# Patient Record
Sex: Female | Born: 1945 | Race: White | Hispanic: No | Marital: Married | Smoking: Never smoker
Health system: Southern US, Community
[De-identification: ages and names within clinical notes are randomized; demographics above are authoritative.]

## PROBLEM LIST (undated history)

## (undated) DIAGNOSIS — D500 Iron deficiency anemia secondary to blood loss (chronic): Secondary | ICD-10-CM

## (undated) DIAGNOSIS — K219 Gastro-esophageal reflux disease without esophagitis: Secondary | ICD-10-CM

## (undated) DIAGNOSIS — U071 COVID-19: Secondary | ICD-10-CM

## (undated) DIAGNOSIS — D509 Iron deficiency anemia, unspecified: Secondary | ICD-10-CM

## (undated) DIAGNOSIS — I10 Essential (primary) hypertension: Secondary | ICD-10-CM

## (undated) DIAGNOSIS — K317 Polyp of stomach and duodenum: Secondary | ICD-10-CM

## (undated) DIAGNOSIS — Z7901 Long term (current) use of anticoagulants: Secondary | ICD-10-CM

## (undated) DIAGNOSIS — N183 Chronic kidney disease, stage 3 unspecified: Secondary | ICD-10-CM

## (undated) DIAGNOSIS — D649 Anemia, unspecified: Secondary | ICD-10-CM

## (undated) DIAGNOSIS — I4891 Unspecified atrial fibrillation: Secondary | ICD-10-CM

## (undated) DIAGNOSIS — Z9841 Cataract extraction status, right eye: Secondary | ICD-10-CM

## (undated) DIAGNOSIS — E785 Hyperlipidemia, unspecified: Secondary | ICD-10-CM

## (undated) DIAGNOSIS — I509 Heart failure, unspecified: Secondary | ICD-10-CM

## (undated) DIAGNOSIS — I700 Atherosclerosis of aorta: Secondary | ICD-10-CM

## (undated) DIAGNOSIS — Z79899 Other long term (current) drug therapy: Secondary | ICD-10-CM

## (undated) DIAGNOSIS — H905 Unspecified sensorineural hearing loss: Secondary | ICD-10-CM

## (undated) DIAGNOSIS — M1990 Unspecified osteoarthritis, unspecified site: Secondary | ICD-10-CM

## (undated) DIAGNOSIS — R011 Cardiac murmur, unspecified: Secondary | ICD-10-CM

## (undated) DIAGNOSIS — I2510 Atherosclerotic heart disease of native coronary artery without angina pectoris: Secondary | ICD-10-CM

## (undated) DIAGNOSIS — Z9842 Cataract extraction status, left eye: Secondary | ICD-10-CM

## (undated) HISTORY — DX: Essential (primary) hypertension: I10

## (undated) HISTORY — PX: CATARACT EXTRACTION, BILATERAL: SHX1313

## (undated) HISTORY — DX: Iron deficiency anemia secondary to blood loss (chronic): D50.0

## (undated) HISTORY — PX: TUBAL LIGATION: SHX77

## (undated) HISTORY — DX: Hyperlipidemia, unspecified: E78.5

## (undated) HISTORY — DX: COVID-19: U07.1

## (undated) HISTORY — DX: Unspecified atrial fibrillation: I48.91

## (undated) HISTORY — DX: Anemia, unspecified: D64.9

---

## 2003-03-31 ENCOUNTER — Other Ambulatory Visit: Admission: RE | Admit: 2003-03-31 | Discharge: 2003-03-31 | Payer: Self-pay | Admitting: Family Medicine

## 2003-05-27 ENCOUNTER — Encounter: Payer: Self-pay | Admitting: Cardiology

## 2003-05-27 ENCOUNTER — Ambulatory Visit (HOSPITAL_COMMUNITY): Admission: RE | Admit: 2003-05-27 | Discharge: 2003-05-27 | Payer: Self-pay | Admitting: Family Medicine

## 2005-11-08 ENCOUNTER — Other Ambulatory Visit: Admission: RE | Admit: 2005-11-08 | Discharge: 2005-11-08 | Payer: Self-pay | Admitting: Family Medicine

## 2005-11-17 ENCOUNTER — Encounter: Admission: RE | Admit: 2005-11-17 | Discharge: 2005-11-17 | Payer: Self-pay | Admitting: Family Medicine

## 2006-11-27 ENCOUNTER — Other Ambulatory Visit: Admission: RE | Admit: 2006-11-27 | Discharge: 2006-11-27 | Payer: Self-pay | Admitting: Family Medicine

## 2009-09-03 ENCOUNTER — Other Ambulatory Visit: Admission: RE | Admit: 2009-09-03 | Discharge: 2009-09-03 | Payer: Self-pay | Admitting: Family Medicine

## 2009-12-09 ENCOUNTER — Encounter: Admission: RE | Admit: 2009-12-09 | Discharge: 2009-12-09 | Payer: Self-pay | Admitting: Family Medicine

## 2010-12-21 ENCOUNTER — Other Ambulatory Visit: Payer: Self-pay | Admitting: Family Medicine

## 2010-12-21 DIAGNOSIS — Z1231 Encounter for screening mammogram for malignant neoplasm of breast: Secondary | ICD-10-CM

## 2010-12-30 ENCOUNTER — Ambulatory Visit
Admission: RE | Admit: 2010-12-30 | Discharge: 2010-12-30 | Disposition: A | Discharge status: Home / Self Care | Payer: Medicare Other | Source: Ambulatory Visit | Attending: Family Medicine | Admitting: Family Medicine

## 2010-12-30 DIAGNOSIS — Z1231 Encounter for screening mammogram for malignant neoplasm of breast: Secondary | ICD-10-CM

## 2012-02-01 ENCOUNTER — Other Ambulatory Visit: Payer: Self-pay | Admitting: Family Medicine

## 2012-02-01 DIAGNOSIS — Z1231 Encounter for screening mammogram for malignant neoplasm of breast: Secondary | ICD-10-CM

## 2012-02-14 ENCOUNTER — Ambulatory Visit
Admission: RE | Admit: 2012-02-14 | Discharge: 2012-02-14 | Disposition: A | Discharge status: Home / Self Care | Payer: Medicare Other | Source: Ambulatory Visit | Attending: Family Medicine | Admitting: Family Medicine

## 2012-02-14 DIAGNOSIS — Z1231 Encounter for screening mammogram for malignant neoplasm of breast: Secondary | ICD-10-CM

## 2013-06-02 ENCOUNTER — Other Ambulatory Visit: Payer: Self-pay

## 2013-06-02 DIAGNOSIS — Z1231 Encounter for screening mammogram for malignant neoplasm of breast: Secondary | ICD-10-CM

## 2013-06-13 ENCOUNTER — Ambulatory Visit: Payer: Medicare Other

## 2013-07-02 ENCOUNTER — Ambulatory Visit
Admission: RE | Admit: 2013-07-02 | Discharge: 2013-07-02 | Disposition: A | Discharge status: Home / Self Care | Payer: Medicare Other | Source: Ambulatory Visit

## 2013-07-02 DIAGNOSIS — Z1231 Encounter for screening mammogram for malignant neoplasm of breast: Secondary | ICD-10-CM

## 2013-08-28 HISTORY — PX: REPLACEMENT TOTAL KNEE: SUR1224

## 2014-07-22 ENCOUNTER — Ambulatory Visit: Payer: Self-pay | Admitting: General Practice

## 2014-07-22 LAB — BASIC METABOLIC PANEL
Anion Gap: 4 — ABNORMAL LOW (ref 7–16)
BUN: 24 mg/dL — AB (ref 7–18)
CALCIUM: 8.9 mg/dL (ref 8.5–10.1)
CHLORIDE: 104 mmol/L (ref 98–107)
CO2: 31 mmol/L (ref 21–32)
Creatinine: 1.1 mg/dL (ref 0.60–1.30)
GFR CALC NON AF AMER: 52 — AB
Glucose: 112 mg/dL — ABNORMAL HIGH (ref 65–99)
Osmolality: 282 (ref 275–301)
Potassium: 3.6 mmol/L (ref 3.5–5.1)
Sodium: 139 mmol/L (ref 136–145)

## 2014-07-22 LAB — CBC
HCT: 37.1 % (ref 35.0–47.0)
HGB: 12.1 g/dL (ref 12.0–16.0)
MCH: 27 pg (ref 26.0–34.0)
MCHC: 32.6 g/dL (ref 32.0–36.0)
MCV: 83 fL (ref 80–100)
Platelet: 342 10*3/uL (ref 150–440)
RBC: 4.47 10*6/uL (ref 3.80–5.20)
RDW: 14.8 % — ABNORMAL HIGH (ref 11.5–14.5)
WBC: 10.5 10*3/uL (ref 3.6–11.0)

## 2014-07-22 LAB — URINALYSIS, COMPLETE
BLOOD: NEGATIVE
Bilirubin,UR: NEGATIVE
Glucose,UR: NEGATIVE mg/dL (ref 0–75)
Ketone: NEGATIVE
NITRITE: NEGATIVE
Ph: 5 (ref 4.5–8.0)
Protein: NEGATIVE
RBC, UR: NONE SEEN /HPF (ref 0–5)
Specific Gravity: 1.021 (ref 1.003–1.030)
WBC UR: 3 /HPF (ref 0–5)

## 2014-07-22 LAB — APTT: Activated PTT: 27.8 secs (ref 23.6–35.9)

## 2014-07-22 LAB — PROTIME-INR
INR: 1
PROTHROMBIN TIME: 12.9 s (ref 11.5–14.7)

## 2014-07-22 LAB — MRSA PCR SCREENING

## 2014-07-22 LAB — SEDIMENTATION RATE: Erythrocyte Sed Rate: 46 mm/hr — ABNORMAL HIGH (ref 0–30)

## 2014-07-24 LAB — URINE CULTURE

## 2014-07-31 ENCOUNTER — Ambulatory Visit: Payer: Self-pay | Admitting: General Practice

## 2014-08-02 LAB — URINE CULTURE

## 2014-08-05 ENCOUNTER — Inpatient Hospital Stay: Payer: Self-pay | Admitting: General Practice

## 2014-08-06 LAB — URINALYSIS, COMPLETE
Bilirubin,UR: NEGATIVE
GLUCOSE, UR: NEGATIVE mg/dL (ref 0–75)
KETONE: NEGATIVE
NITRITE: NEGATIVE
PH: 5 (ref 4.5–8.0)
SPECIFIC GRAVITY: 1.035 (ref 1.003–1.030)
Squamous Epithelial: <1

## 2014-08-06 LAB — BASIC METABOLIC PANEL
Anion Gap: 7 (ref 7–16)
BUN: 17 mg/dL (ref 7–18)
CALCIUM: 8.4 mg/dL — AB (ref 8.5–10.1)
CHLORIDE: 104 mmol/L (ref 98–107)
CREATININE: 1.01 mg/dL (ref 0.60–1.30)
Co2: 28 mmol/L (ref 21–32)
EGFR (African American): >60
EGFR (Non-African Amer.): 58 — ABNORMAL LOW
Glucose: 130 mg/dL — ABNORMAL HIGH (ref 65–99)
Osmolality: 281 (ref 275–301)
POTASSIUM: 3.5 mmol/L (ref 3.5–5.1)
Sodium: 139 mmol/L (ref 136–145)

## 2014-08-06 LAB — HEMOGLOBIN: HGB: 10.7 g/dL — ABNORMAL LOW (ref 12.0–16.0)

## 2014-08-06 LAB — PLATELET COUNT: PLATELETS: 274 10*3/uL (ref 150–440)

## 2014-08-07 LAB — HEMOGLOBIN: HGB: 10.4 g/dL — AB (ref 12.0–16.0)

## 2014-08-07 LAB — PLATELET COUNT: Platelet: 274 10*3/uL (ref 150–440)

## 2014-08-07 LAB — BASIC METABOLIC PANEL
ANION GAP: 7 (ref 7–16)
BUN: 16 mg/dL (ref 7–18)
CALCIUM: 8.3 mg/dL — AB (ref 8.5–10.1)
CHLORIDE: 101 mmol/L (ref 98–107)
CO2: 29 mmol/L (ref 21–32)
Creatinine: 1.17 mg/dL (ref 0.60–1.30)
GFR CALC AF AMER: 59 — AB
GFR CALC NON AF AMER: 49 — AB
Glucose: 136 mg/dL — ABNORMAL HIGH (ref 65–99)
Osmolality: 277 (ref 275–301)
Potassium: 3.4 mmol/L — ABNORMAL LOW (ref 3.5–5.1)
SODIUM: 137 mmol/L (ref 136–145)

## 2014-08-08 LAB — URINE CULTURE

## 2014-10-30 DIAGNOSIS — Z96652 Presence of left artificial knee joint: Secondary | ICD-10-CM | POA: Insufficient documentation

## 2014-11-27 ENCOUNTER — Other Ambulatory Visit: Payer: Self-pay

## 2014-11-27 DIAGNOSIS — Z1231 Encounter for screening mammogram for malignant neoplasm of breast: Secondary | ICD-10-CM

## 2014-12-08 ENCOUNTER — Ambulatory Visit
Admission: RE | Admit: 2014-12-08 | Discharge: 2014-12-08 | Disposition: A | Discharge status: Home / Self Care | Payer: Medicare HMO | Source: Ambulatory Visit

## 2014-12-08 DIAGNOSIS — Z1231 Encounter for screening mammogram for malignant neoplasm of breast: Secondary | ICD-10-CM

## 2014-12-19 NOTE — Op Note (Signed)
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  PROCEDURE PERFORMED: Left total knee arthroplasty using computer-assisted navigation.   SURGEON: Skip Estimable, M.D.   ASSISTANT: Rachelle Hora, PA-C (required to maintain retraction throughout the procedure).   ANESTHESIA: Spinal.   ESTIMATED BLOOD LOSS: 100 mL.   FLUIDS REPLACED: 1200 mL of crystalloid.   TOURNIQUET TIME: 111 minutes.   DRAINS: Two medium drains to reinfusion system.   SOFT TISSUE RELEASES: Anterior cruciate ligament, posterior cruciate ligament, deep medial collateral ligament and patellofemoral ligament.   IMPLANTS UTILIZED: DePuy PFC Sigma size 3 posterior stabilized femoral component (cemented), size 3 MBT tibial component (cemented), 35 mm three-peg oval dome patella (cemented), and a 10 mm stabilized rotating platform polyethylene insert.   INDICATIONS FOR SURGERY: The patient is a 69 year old female who has been seen for complaints of chronic progressive left knee pain. X-rays demonstrated severe degenerative changes in tricompartmental fashion with varus deformity. After discussion of the risks and benefits of surgical intervention, the patient expressed understanding of the risks and benefits and agreed with plans for surgical intervention.   PROCEDURE IN DETAIL: The patient was brought to the operating room and, after adequate spinal anesthesia was achieved, a tourniquet was placed on the patient's upper left thigh. The patient's left knee and leg were cleaned and prepped with alcohol and DuraPrep and draped in the usual sterile fashion. A "timeout" was performed as per usual protocol. The left lower extremity was exsanguinated using an Esmarch, and the tourniquet was inflated to 300 mmHg. An anterior  longitudinal incision was made followed by a standard mid vastus approach. A moderate effusion was evacuated. The deep fibers of the medial collateral ligament were elevated in a subperiosteal fashion off the medial flare of the tibia so as to maintain a continuous soft tissue sleeve. The patella was subluxed laterally and the patellofemoral ligament was incised. Prominent superior and inferior osteophytes emanating from the patella were debrided using rongeurs so as to better mobilize the patella. Inspection of the knee demonstrated severe degenerative changes in tricompartmental fashion with full-thickness loss of articular cartilage to the medial compartment. Prominent osteophytes were debrided from the medial and lateral aspect of both the tibia and femur. Anterior and posterior cruciate ligaments were excised. Two 4.0 mm Schanz pins were inserted into the femur and into the tibia for attachment of the ray of trackers used for computer-assisted navigation. Hip center was identified using the circumduction technique. Distal landmarks were mapped using the computer. The distal femur and proximal tibia were mapped using the computer. Distal femoral cutting guide was positioned using computer-assisted navigation so as to achieve a 5 degree distal valgus cut. Cut was performed and verified using the computer. The sizing device for the femur could not be adequately placed flush on the distal femoral cut due to limited range of motion. It was thus elected to proceed with resection of the proximal tibial aspect. The extramedullary tibial cutting guide was positioned using computer-assisted navigation so as to achieve 0 degree varus valgus alignment and 0 degree posterior slope. Cut was performed and verified using the computer. The medial and lateral menisci were excised. This allowed for adequate room to place the femoral sizing device. It was felt that a size 3 femur was appropriate. A size 3 cutting guide was  positioned and the anterior cut was performed and verified using the computer. This was followed by completion of the posterior and chamfer cuts. Femoral cutting guide for the central box was then positioned, and the central box cut was performed. The proximal tibia had been sized and it was felt that a size 3 tibial tray was appropriate. Tibial and femoral trials were inserted followed by insertion of a 10 mm polyethylene insert. This allowed for excellent mediolateral soft tissue balancing both in full extension and in flexion. Finally, the patella was cut and prepared so as to accommodate a 35 mm three-peg oval dome patella. Patellar trial was placed and the knee was placed through a range of motion with excellent patellar tracking appreciated. The femoral trial was removed. Central post hole for the tibial component was reamed followed by insertion of a keel punch. Tibial trials were then removed. The cut surfaces of bone were irrigated with copious amounts of normal saline with antibiotic solution using pulsatile lavage and then suctioned dry. Polymethyl methacrylate cement with gentamicin was prepared in the usual fashion using a vacuum mixer. Cement was applied to the cut surface of the proximal tibia as well as along the undersurface of a size 3 MBT tibial component. The tibial component was positioned and impacted into place. Excess cement was removed using freer elevators. Cement was then applied to the cut surface of the distal femur as well as along the posterior flanges of a size 3 posterior stabilized femoral component. Femoral component was positioned and impacted into place. Excess cement was removed using freer elevators. A 10 mm polyethylene trial was inserted and the knee was brought into full extension with steady axial compression applied. Finally, cement was applied to the backside of a 35 mm three-peg oval dome patella and the patellar component was positioned and patellar clamp applied.  Excess cement was removed using freer elevators.   After adequate curing of cement, the tourniquet was deflated after total tourniquet time of 111 minutes. Hemostasis was achieved using electrocautery. The knee was irrigated with copious amounts of normal saline with antibiotic solution using pulsatile lavage and then suctioned dry. The knee was inspected for any residual cement debris. Then 20 mL of 1.3% Exparel and 40 mL of normal saline was injected along the posterior capsule, medial and lateral gutters, and along the arthrotomy site. A 10 mm stabilized rotating platform polyethylene insert was inserted and the knee was placed through a range of motion. Excellent patellar tracking was appreciated and excellent mediolateral soft tissue balancing was noted in full extension and in flexion. Two medium drains were placed in the wound bed and brought out through a separate stab incision to be attached to a reinfusion system. The medial parapatellar portion of the incision was reapproximated using interrupted sutures of #1 Vicryl. The subcutaneous tissue was injected with 30 mL of 0.25% Marcaine with epinephrine. The subcutaneous tissue was then reapproximated in layers using first #0 Vicryl followed by #2-0 Vicryl. Skin was closed with skin staples. A sterile dressing was applied.   The patient tolerated the procedure well. She was transported to the recovery room in stable condition.  ____________________________ Laurice Record. Holley Bouche., MD jph:sb D: 08/06/2014 06:43:36 ET T: 08/06/2014 13:29:12 ET JOB#: [id number]  cc: Laurice Record. Holley Bouche., MD, <Dictator> Laurice Record Holley Bouche MD ELECTRONICALLY SIGNED 08/09/2014 21:54

## 2014-12-19 NOTE — Discharge Summary (Signed)
PATIENT NAME:  Amanda Frazier, Amanda Frazier MR#:  [id number] DATE OF BIRTH:  [birth year]  DATE OF ADMISSION:  08/05/2014 DATE OF DISCHARGE:  08/08/2014   ADMITTING DIAGNOSIS: Left knee degenerative arthritis.   DISCHARGE DIAGNOSIS: Left knee degenerative arthritis.   PROCEDURE PERFORMED: Left total knee arthroplasty using computer-assisted navigation.   SURGEON: Skip Estimable, M.D.   ASSISTANT: Rachelle Hora, PA-C.   ANESTHESIA: Spinal.   ESTIMATED BLOOD LOSS: 100 mL.  FLUIDS REPLACED: 1200 mL of crystalloid.   TOURNIQUET TIME: 111 minutes.   DRAINS: Two medium drains to reinfusion system.   SOFT TISSUE RELEASES: Anterior cruciate ligament, posterior cruciate ligament, deep medial collateral ligament and patellofemoral ligament.   IMPLANTS UTILIZED: DePuy PFC SIGMA size 3 posterior stabilized femoral component, size 3 MBT tibial component, 35 mm 3 peg oval dome patella, and a 10 mm stabilized rotating platform polyethylene insert.   HISTORY: The patient is a 69 year old female who has had severe complaints of chronic progressive left knee pain. X-rays demonstrated severe degenerative changes in tricompartmental fashion of a varus deformity. After discussion of the risks and benefits of surgical intervention, the patient expressed understanding of the risks and benefits and agreed with plans for surgical intervention.   PHYSICAL EXAMINATION: GENERAL: Well-developed, well-nourished, in no acute distress.  Varus thrust of the left knee.  HEENT: Atraumatic, normocephalic. Pupils equal, round, and reactive to light.  NECK: Supple, nontender with good range of motion.  LUNGS: Clear to auscultation. CARDIOVASCULAR: Regular rate and rhythm.  ABDOMEN: Soft, nontender, nondistended. Bowel sounds present.  LEFT LOWER EXTREMITY: Examination of left lower extremity shows the patient has mild swelling. No effusion, no warmth, erythema. She is tender along the medial joint line, nontender in the lateral  joint line. She has relative varus alignment. Good tracking of the patella. She has 0-117 degrees range of motion. She is neurovascularly intact in the left lower extremity.   HOSPITAL COURSE: The patient was admitted  to the hospital on 08/05/2014. She had surgery that same day and was brought to the orthopedic floor from the PACU in stable condition. On postop day 1, the patient had acute postop blood loss anemia with a hemoglobin of 10.7. On postop day 2, she had an acute postop blood loss anemia with a hemoglobin of 10.4. The patient's vital signs have been stable. She made significant progress with physical therapy. By postop day 3, she was ambulating greater than 300 feet, able to complete stairs. The patient had met all goals for returning home with home health PT.   CONDITION AT DISCHARGE: Stable.   DISCHARGE INSTRUCTIONS: The patient can increase weight-bearing on the effected extremity. Elevate the effected foot or leg on 1 or 2 pillows with the foot higher than the knee. Thigh-high TED hose on both legs and remove at bedtime and replace when arising the next morning. Elevate the heels off the bed. Use incentive spirometer every hour while awake,  encourage cough and deep breathing. The patient may resume a regular diet as tolerated. Continue using Polar Care unit maintaining temp between 40 and 50 degrees.  Do not get the dressing or bandage wet or dirty. Call [facility] if the dressing gets water under it. Leave the dressing on. Change dressing as needed with physical therapy. Call [facility] if any of the following occur: Bright red bleeding from the incision wound, fever above 101.5 degrees, redness, swelling, or drainage at the incision. Call [facility] if you experience any increased leg pain, numbness, weakness in legs,  or bowel or bladder symptoms.  Home physical therapy has been arranged for continuation of rehabilitation program. Please call [facility] if a therapist has not contacted you within 48  hours of your return home.   DISCHARGE MEDICATIONS: Please see list of discharge medications from discharge instructions.   ____________________________ T. Rachelle Hora, PA-C tcg:DT D: 08/08/2014 08:29:00 ET T: 08/08/2014 13:17:17 ET JOB#: [id number]  cc: T. Rachelle Hora, PA-C, <Dictator> Duanne Guess [place] ELECTRONICALLY SIGNED 08/20/2014 8:36

## 2015-08-03 DIAGNOSIS — Z96652 Presence of left artificial knee joint: Secondary | ICD-10-CM | POA: Diagnosis not present

## 2016-01-17 DIAGNOSIS — E559 Vitamin D deficiency, unspecified: Secondary | ICD-10-CM | POA: Diagnosis not present

## 2016-01-17 DIAGNOSIS — I10 Essential (primary) hypertension: Secondary | ICD-10-CM | POA: Diagnosis not present

## 2016-01-17 DIAGNOSIS — E785 Hyperlipidemia, unspecified: Secondary | ICD-10-CM | POA: Diagnosis not present

## 2016-01-17 DIAGNOSIS — N183 Chronic kidney disease, stage 3 (moderate): Secondary | ICD-10-CM | POA: Diagnosis not present

## 2016-01-17 DIAGNOSIS — Z79899 Other long term (current) drug therapy: Secondary | ICD-10-CM | POA: Diagnosis not present

## 2016-01-17 DIAGNOSIS — Z0000 Encounter for general adult medical examination without abnormal findings: Secondary | ICD-10-CM | POA: Diagnosis not present

## 2016-01-17 DIAGNOSIS — E8881 Metabolic syndrome: Secondary | ICD-10-CM | POA: Diagnosis not present

## 2016-01-17 DIAGNOSIS — M810 Age-related osteoporosis without current pathological fracture: Secondary | ICD-10-CM | POA: Diagnosis not present

## 2016-02-03 DIAGNOSIS — E876 Hypokalemia: Secondary | ICD-10-CM | POA: Diagnosis not present

## 2016-11-23 ENCOUNTER — Other Ambulatory Visit: Payer: Self-pay | Admitting: Family Medicine

## 2016-11-23 DIAGNOSIS — Z1231 Encounter for screening mammogram for malignant neoplasm of breast: Secondary | ICD-10-CM

## 2016-12-08 ENCOUNTER — Ambulatory Visit
Admission: RE | Admit: 2016-12-08 | Discharge: 2016-12-08 | Disposition: A | Discharge status: Home / Self Care | Payer: Medicare HMO | Source: Ambulatory Visit | Attending: Family Medicine | Admitting: Family Medicine

## 2016-12-08 DIAGNOSIS — Z1231 Encounter for screening mammogram for malignant neoplasm of breast: Secondary | ICD-10-CM | POA: Diagnosis not present

## 2017-01-11 DIAGNOSIS — Z96652 Presence of left artificial knee joint: Secondary | ICD-10-CM | POA: Diagnosis not present

## 2017-01-18 DIAGNOSIS — M810 Age-related osteoporosis without current pathological fracture: Secondary | ICD-10-CM | POA: Diagnosis not present

## 2017-01-18 DIAGNOSIS — I10 Essential (primary) hypertension: Secondary | ICD-10-CM | POA: Diagnosis not present

## 2017-01-18 DIAGNOSIS — Z0000 Encounter for general adult medical examination without abnormal findings: Secondary | ICD-10-CM | POA: Diagnosis not present

## 2017-01-18 DIAGNOSIS — E559 Vitamin D deficiency, unspecified: Secondary | ICD-10-CM | POA: Diagnosis not present

## 2017-01-18 DIAGNOSIS — Z79899 Other long term (current) drug therapy: Secondary | ICD-10-CM | POA: Diagnosis not present

## 2017-01-18 DIAGNOSIS — E7800 Pure hypercholesterolemia, unspecified: Secondary | ICD-10-CM | POA: Diagnosis not present

## 2017-01-23 DIAGNOSIS — D485 Neoplasm of uncertain behavior of skin: Secondary | ICD-10-CM | POA: Diagnosis not present

## 2017-01-23 DIAGNOSIS — D225 Melanocytic nevi of trunk: Secondary | ICD-10-CM | POA: Diagnosis not present

## 2017-01-23 DIAGNOSIS — L821 Other seborrheic keratosis: Secondary | ICD-10-CM | POA: Diagnosis not present

## 2017-08-16 DIAGNOSIS — H43812 Vitreous degeneration, left eye: Secondary | ICD-10-CM | POA: Diagnosis not present

## 2017-08-16 DIAGNOSIS — H2513 Age-related nuclear cataract, bilateral: Secondary | ICD-10-CM | POA: Diagnosis not present

## 2017-08-16 DIAGNOSIS — H35412 Lattice degeneration of retina, left eye: Secondary | ICD-10-CM | POA: Diagnosis not present

## 2017-10-02 DIAGNOSIS — H2513 Age-related nuclear cataract, bilateral: Secondary | ICD-10-CM | POA: Diagnosis not present

## 2017-10-02 DIAGNOSIS — H35412 Lattice degeneration of retina, left eye: Secondary | ICD-10-CM | POA: Diagnosis not present

## 2017-10-02 DIAGNOSIS — H43812 Vitreous degeneration, left eye: Secondary | ICD-10-CM | POA: Diagnosis not present

## 2018-03-25 DIAGNOSIS — M810 Age-related osteoporosis without current pathological fracture: Secondary | ICD-10-CM | POA: Diagnosis not present

## 2018-03-25 DIAGNOSIS — Z79899 Other long term (current) drug therapy: Secondary | ICD-10-CM | POA: Diagnosis not present

## 2018-03-25 DIAGNOSIS — R7303 Prediabetes: Secondary | ICD-10-CM | POA: Diagnosis not present

## 2018-03-25 DIAGNOSIS — E559 Vitamin D deficiency, unspecified: Secondary | ICD-10-CM | POA: Diagnosis not present

## 2018-03-25 DIAGNOSIS — Z0000 Encounter for general adult medical examination without abnormal findings: Secondary | ICD-10-CM | POA: Diagnosis not present

## 2018-03-25 DIAGNOSIS — Z96652 Presence of left artificial knee joint: Secondary | ICD-10-CM | POA: Diagnosis not present

## 2018-03-25 DIAGNOSIS — E785 Hyperlipidemia, unspecified: Secondary | ICD-10-CM | POA: Diagnosis not present

## 2018-03-25 DIAGNOSIS — I10 Essential (primary) hypertension: Secondary | ICD-10-CM | POA: Diagnosis not present

## 2018-04-25 DIAGNOSIS — M8588 Other specified disorders of bone density and structure, other site: Secondary | ICD-10-CM | POA: Diagnosis not present

## 2018-04-25 DIAGNOSIS — E2839 Other primary ovarian failure: Secondary | ICD-10-CM | POA: Diagnosis not present

## 2018-04-25 DIAGNOSIS — M810 Age-related osteoporosis without current pathological fracture: Secondary | ICD-10-CM | POA: Diagnosis not present

## 2018-10-02 DIAGNOSIS — H57813 Brow ptosis, bilateral: Secondary | ICD-10-CM | POA: Diagnosis not present

## 2018-10-02 DIAGNOSIS — H35412 Lattice degeneration of retina, left eye: Secondary | ICD-10-CM | POA: Diagnosis not present

## 2018-10-02 DIAGNOSIS — H2513 Age-related nuclear cataract, bilateral: Secondary | ICD-10-CM | POA: Diagnosis not present

## 2018-10-02 DIAGNOSIS — H43812 Vitreous degeneration, left eye: Secondary | ICD-10-CM | POA: Diagnosis not present

## 2019-05-20 DIAGNOSIS — I10 Essential (primary) hypertension: Secondary | ICD-10-CM | POA: Diagnosis not present

## 2019-05-20 DIAGNOSIS — Z23 Encounter for immunization: Secondary | ICD-10-CM | POA: Diagnosis not present

## 2019-05-20 DIAGNOSIS — E559 Vitamin D deficiency, unspecified: Secondary | ICD-10-CM | POA: Diagnosis not present

## 2019-05-20 DIAGNOSIS — Z79899 Other long term (current) drug therapy: Secondary | ICD-10-CM | POA: Diagnosis not present

## 2019-05-20 DIAGNOSIS — Z96652 Presence of left artificial knee joint: Secondary | ICD-10-CM | POA: Diagnosis not present

## 2019-05-20 DIAGNOSIS — N183 Chronic kidney disease, stage 3 (moderate): Secondary | ICD-10-CM | POA: Diagnosis not present

## 2019-05-20 DIAGNOSIS — E785 Hyperlipidemia, unspecified: Secondary | ICD-10-CM | POA: Diagnosis not present

## 2019-05-20 DIAGNOSIS — Z0000 Encounter for general adult medical examination without abnormal findings: Secondary | ICD-10-CM | POA: Diagnosis not present

## 2019-05-20 DIAGNOSIS — R7303 Prediabetes: Secondary | ICD-10-CM | POA: Diagnosis not present

## 2019-06-05 DIAGNOSIS — N183 Chronic kidney disease, stage 3 unspecified: Secondary | ICD-10-CM | POA: Diagnosis not present

## 2019-06-05 DIAGNOSIS — I10 Essential (primary) hypertension: Secondary | ICD-10-CM | POA: Diagnosis not present

## 2019-06-05 DIAGNOSIS — E785 Hyperlipidemia, unspecified: Secondary | ICD-10-CM | POA: Diagnosis not present

## 2019-06-05 DIAGNOSIS — R7303 Prediabetes: Secondary | ICD-10-CM | POA: Diagnosis not present

## 2019-08-29 HISTORY — PX: CARDIOVERSION: SHX1299

## 2019-10-15 ENCOUNTER — Ambulatory Visit (INDEPENDENT_AMBULATORY_CARE_PROVIDER_SITE_OTHER): Payer: Medicare HMO | Admitting: Cardiology

## 2019-10-15 ENCOUNTER — Encounter: Payer: Self-pay | Admitting: Cardiology

## 2019-10-15 ENCOUNTER — Other Ambulatory Visit: Payer: Self-pay

## 2019-10-15 VITALS — BP 152/68 | HR 57 | Temp 97.6°F | Ht 64.0 in | Wt 199.0 lb

## 2019-10-15 DIAGNOSIS — I480 Paroxysmal atrial fibrillation: Secondary | ICD-10-CM

## 2019-10-15 DIAGNOSIS — I10 Essential (primary) hypertension: Secondary | ICD-10-CM

## 2019-10-15 DIAGNOSIS — R739 Hyperglycemia, unspecified: Secondary | ICD-10-CM

## 2019-10-15 DIAGNOSIS — E7800 Pure hypercholesterolemia, unspecified: Secondary | ICD-10-CM

## 2019-10-15 MED ORDER — DILTIAZEM HCL ER BEADS 180 MG PO CP24: 180.0000 mg | ORAL_CAPSULE | Freq: Every day | ORAL | 2 refills | Status: DC

## 2019-10-15 MED ORDER — SPIRONOLACTONE-HCTZ 25-25 MG PO TABS: 1.0000 | ORAL_TABLET | ORAL | 2 refills | Status: DC

## 2019-10-15 MED ORDER — APIXABAN 5 MG PO TABS: 5.0000 mg | ORAL_TABLET | Freq: Two times a day (BID) | ORAL | 6 refills | Status: DC

## 2019-10-15 NOTE — Progress Notes (Signed)
Primary Physician/Referring:  [name], MD  Patient ID: Amanda Frazier, female    DOB: September 06, 1945, 74 y.o.   MRN: [id number]  Chief Complaint  Patient presents with  . Atrial Fibrillation  . Hospitalization Follow-up   HPI:    Amanda Frazier  is a 74 y.o. Caucasian female with hypertension, hyperlipidemia, hyperglycemia seen in the emergency room on 10/01/2019 for atrial fibrillation.  Patient asymptomatic, was checking her blood pressure and blood pressure recorder suggested irregular heartbeat.  She called her PCP and was sent over to the emergency room after EKG demonstrated atrial fibrillation.  In the ED she was ruled out for myocardial infarction, discharged home with diltiazem and Eliquis on board.  Remains asymptomatic.  She has been recording her blood pressure regularly and has noticed heart rate reduced and became regular with past 4 to 5 days.  Past Medical History:  Diagnosis Date  . Atrial fibrillation ([place])   . Hyperlipidemia   . Hypertension    Past Surgical History:  Procedure Laterality Date  . REPLACEMENT TOTAL KNEE Left 2015   Social History   Tobacco Use  . Smoking status: Never Smoker  . Smokeless tobacco: Never Used  Substance Use Topics  . Alcohol use: Never   ROS  Review of Systems  Constitution: Positive for weight loss (20 lbs in last 6-8 months and thinks intentional).  Cardiovascular: Negative for dyspnea on exertion and leg swelling.  Gastrointestinal: Negative for melena.   Objective  Blood pressure (!) 152/68, pulse (!) 57, temperature 97.6 F (36.4 C), height '5\' 4"'  (1.626 m), weight 199 lb (90.3 kg), SpO2 97 %.  Vitals with BMI 10/15/2019  Height '5\' 4"'   Weight 199 lbs  BMI 00.17  Systolic 494  Diastolic 68  Pulse 57     Physical Exam  Cardiovascular: Normal rate, regular rhythm, normal heart sounds and intact distal pulses. Exam reveals no gallop.  No murmur heard. No leg edema, no JVD.  Pulmonary/Chest: Effort  normal and breath sounds normal.  Abdominal: Soft. Bowel sounds are normal.  Skin: Skin is warm and dry.   Laboratory examination:   No results for input(s): NA, K, CL, CO2, GLUCOSE, BUN, CREATININE, CALCIUM, GFRNONAA, GFRAA in the last 8760 hours. CrCl cannot be calculated (Patient's most recent lab result is older than the maximum 21 days allowed.).  CMP Latest Ref Rng & Units 08/07/2014 08/06/2014 07/22/2014  Glucose 65 - 99 mg/dL 136(H) 130(H) 112(H)  BUN 7 - 18 mg/dL 16 17 24(H)  Creatinine 0.60 - 1.30 mg/dL 1.17 1.01 1.10  Sodium 136 - 145 mmol/L 137 139 139  Potassium 3.5 - 5.1 mmol/L 3.4(L) 3.5 3.6  Chloride 98 - 107 mmol/L 101 104 104  CO2 21 - 32 mmol/L '29 28 31  ' Calcium 8.5 - 10.1 mg/dL 8.3(L) 8.4(L) 8.9   CBC Latest Ref Rng & Units 08/07/2014 08/06/2014 07/22/2014  WBC 3.6 - 11.0 x10 3/mm 3 - - 10.5  Hemoglobin 12.0 - 16.0 g/dL 10.4(L) 10.7(L) 12.1  Hematocrit 35.0 - 47.0 % - - 37.1  Platelets 150 - 440 x10 3/mm 3 274 274 342   Lipid Panel  No results found for: CHOL, TRIG, HDL, CHOLHDL, VLDL, LDLCALC, LDLDIRECT HEMOGLOBIN A1C No results found for: HGBA1C, MPG TSH No results for input(s): TSH in the last 8760 hours.  External labs:   Labs 10/01/2019: Hb 12.7/HCT 40.0, platelets 324.  Serum glucose 116 mg, BUN 18, creatinine 1.09, EGFR 50 mL.  Calcium minimally elevated at  10.4, CMP otherwise normal.    Medications and allergies   Allergies  Allergen Reactions  . Atorvastatin Other (See Comments)    Fatigue  . Celecoxib Nausea And Vomiting  . Other Nausea And Vomiting  . Fosamax [Alendronate Sodium] Nausea And Vomiting  . Lipitor [Atorvastatin Calcium]     Fatigue  . Benazepril     Other reaction(s): Cough  . Nickel     Other reaction(s): Other (See Comments) Positive patch test  . Palladium Chloride     Other reaction(s): Other (See Comments) Positive patch test  . Simvastatin Diarrhea  . Valsartan     Other reaction(s): Cough     Current  Outpatient Medications  Medication Instructions  . apixaban (ELIQUIS) 5 mg, Oral, 2 times daily  . calcium carbonate (TUMS - DOSED IN MG ELEMENTAL CALCIUM) 500 MG chewable tablet 1 tablet, Oral, 2 times daily  . cloNIDine (CATAPRES) 0.1 mg, Oral, 2 times daily, 2 tab am, 1 tab noon and evening  . diltiazem (TIAZAC) 180 mg, Oral, Daily  . omeprazole (PRILOSEC) 20 MG capsule 1 capsule, Oral, Daily  . rosuvastatin (CRESTOR) 10 mg, Oral, Daily  . spironolactone-hydrochlorothiazide (ALDACTAZIDE) 25-25 MG tablet 1 tablet, Oral, BH-each morning    Radiology:   Chest x-ray PA and lateral view 09/30/2019: There is minimal scarring versus atelectasis on the lateral left lung base. No focal infiltrate is seen. There is no pleural effusion or pneumothorax. Cardiac silhouette is borderline enlarged. Mediastinal contours are normal. There are mild degenerative changes at the shoulders and throughout the spine.  Impression:  Borderline cardiomegaly. No visualized acute abnormality.   Cardiac Studies:   None  Assessment     ICD-10-CM   1. Paroxysmal atrial fibrillation ([place]). CHA2DS2-VASc Score is 3.  Yearly risk of stroke: 3.2% (A, F, HTN)  I48.0 EKG 12-Lead    TSH    PCV ECHOCARDIOGRAM COMPLETE    PCV MYOCARDIAL PERFUSION WITH LEXISCAN    apixaban (ELIQUIS) 5 MG TABS tablet  2. Primary hypertension  I10 spironolactone-hydrochlorothiazide (ALDACTAZIDE) 25-25 MG tablet    PCV MYOCARDIAL PERFUSION WITH LEXISCAN    BASIC METABOLIC PANEL WITH GFR    cloNIDine (CATAPRES) 0.1 MG tablet    diltiazem (TIAZAC) 180 MG 24 hr capsule  3. Hypercholesteremia  E78.00 PCV MYOCARDIAL PERFUSION WITH LEXISCAN  4. Hyperglycemia  R73.9 PCV MYOCARDIAL PERFUSION WITH LEXISCAN    EKG 10/15/2019: Sinus bradycardia at the rate of 51 bpm, normal axis, incomplete right bundle branch block.  No evidence of ischemia, normal QT interval.   EKG 10/01/2019: Atrial fibrillation with rapid ventricular response with rate of  111 bpm, normal axis, nonspecific ST depression in V1 and V2, cannot exclude septal ischemia.  Low-voltage complexes.  Abnormal EKG.  Meds ordered this encounter  Medications  . spironolactone-hydrochlorothiazide (ALDACTAZIDE) 25-25 MG tablet    Sig: Take 1 tablet by mouth every morning.    Dispense:  30 tablet    Refill:  2  . apixaban (ELIQUIS) 5 MG TABS tablet    Sig: Take 1 tablet (5 mg total) by mouth 2 (two) times daily.    Dispense:  60 tablet    Refill:  6  . diltiazem (TIAZAC) 180 MG 24 hr capsule    Sig: Take 1 capsule (180 mg total) by mouth daily.    Dispense:  30 capsule    Refill:  2    Medications Discontinued During This Encounter  Medication Reason  . hydrochlorothiazide (HYDRODIURIL) 25 MG tablet Change in  therapy  . cloNIDine (CATAPRES) 0.1 MG tablet Reorder  . diltiazem (TIAZAC) 180 MG 24 hr capsule Reorder  . apixaban (ELIQUIS) 5 MG TABS tablet Reorder      Recommendations:   KIMLEY APSEY  is a 74 y.o. Caucasian female with hypertension, hyperlipidemia, hyperglycemia seen in the emergency room on 10/01/2019 for atrial fibrillation. Patient asymptomatic, was checking her blood pressure and blood pressure recorder suggested irregular heartbeat, EKG was showing A. fib with RVR.  In the ED she was ruled out for MI and discharged home on Eliquis and diltiazem.  She remained asymptomatic throughout.  Converted back to sinus rhythm probably 4 to 5 days ago by heart rate evaluation at home.  Etiology for atrial fibrillation includes uncontrolled hypertension, hypothyroidism as she has lost about 20 pounds in weight although she states it is probably intentional, underlying coronary disease in view of hypertension, hyperlipidemia and hyperglycemia and age as risk factors.  I am concerned about her being on high-dose clonidine at this age, will reduce clonidine to 0.1 mg p.o. twice daily.  Continue diltiazem CD 180 mg daily, I will add Aldactazide 25/25 mg every  morning and discontinue hydrochlorothiazide as well.  Patient has chronic bradycardia and this may been due to patient being on chronically on high-dose clonidine.  Blood pressure still remains high.  We will schedule her for Lexiscan tetrofosmin stress test along with an echocardiogram and I will see her back in 4 weeks.  Adrian Prows, MD, [facility] 10/15/2019, 10:46 AM [facility]. [place] Office: [phone]

## 2019-10-20 ENCOUNTER — Telehealth: Payer: Self-pay

## 2019-10-20 DIAGNOSIS — I10 Essential (primary) hypertension: Secondary | ICD-10-CM

## 2019-10-20 NOTE — Telephone Encounter (Signed)
I have no idea what you guys are talking about

## 2019-10-20 NOTE — Telephone Encounter (Signed)
Telephone encounter:  Reason for call: Pt called sttaing that you made some med changes as far as her bp at last ov. Her bp has been running Sat 153/68, 158/65, Sun 158/65, Today 155/71. Please advise.//ah    Usual provider: JG  Last office visit: 10/15/19  Next office visit: 11/17/19   Last hospitalization:    Current Outpatient Medications on File Prior to Visit  Medication Sig Dispense Refill  . apixaban (ELIQUIS) 5 MG TABS tablet Take 1 tablet (5 mg total) by mouth 2 (two) times daily. 60 tablet 6  . calcium carbonate (TUMS - DOSED IN MG ELEMENTAL CALCIUM) 500 MG chewable tablet Chew 1 tablet by mouth 2 (two) times daily.    . cloNIDine (CATAPRES) 0.1 MG tablet Take 1 tablet (0.1 mg total) by mouth 2 (two) times daily. 2 tab am, 1 tab noon and evening 60 tablet 0  . diltiazem (TIAZAC) 180 MG 24 hr capsule Take 1 capsule (180 mg total) by mouth daily. 30 capsule 2  . omeprazole (PRILOSEC) 20 MG capsule Take 1 capsule by mouth daily.    . rosuvastatin (CRESTOR) 10 MG tablet Take 10 mg by mouth daily.    Marland Kitchen spironolactone-hydrochlorothiazide (ALDACTAZIDE) 25-25 MG tablet Take 1 tablet by mouth every morning. 30 tablet 2   No current facility-administered medications on file prior to visit.

## 2019-10-22 MED ORDER — LABETALOL HCL 200 MG PO TABS: 200.0000 mg | ORAL_TABLET | Freq: Two times a day (BID) | ORAL | 3 refills | Status: DC

## 2019-10-22 NOTE — Telephone Encounter (Signed)
Pt aware of change and new rx sent to pharmacy.//ah

## 2019-10-22 NOTE — Telephone Encounter (Signed)
She took 4 clonidines yesterday. You reduced it at last ov to bid. She is concerned that bp is still running high.

## 2019-10-22 NOTE — Telephone Encounter (Signed)
Sent for labetalol 200 mg BID

## 2019-10-22 NOTE — Telephone Encounter (Signed)
I do not want her to increase the clonidine.  Started on labetalol 20 mg p.o. twice daily.

## 2019-10-27 ENCOUNTER — Other Ambulatory Visit: Payer: Self-pay

## 2019-10-27 ENCOUNTER — Ambulatory Visit: Payer: Medicare HMO

## 2019-10-27 DIAGNOSIS — E7800 Pure hypercholesterolemia, unspecified: Secondary | ICD-10-CM

## 2019-10-27 DIAGNOSIS — I480 Paroxysmal atrial fibrillation: Secondary | ICD-10-CM

## 2019-10-27 DIAGNOSIS — R739 Hyperglycemia, unspecified: Secondary | ICD-10-CM

## 2019-10-27 DIAGNOSIS — I10 Essential (primary) hypertension: Secondary | ICD-10-CM

## 2019-10-30 LAB — TSH: TSH: 2.06 u[IU]/mL (ref 0.450–4.500)

## 2019-10-30 NOTE — Progress Notes (Signed)
Called pt to inform her about her stress test being normal. Pt understood

## 2019-11-05 ENCOUNTER — Ambulatory Visit: Payer: Medicare HMO

## 2019-11-05 ENCOUNTER — Other Ambulatory Visit: Payer: Self-pay

## 2019-11-05 DIAGNOSIS — I480 Paroxysmal atrial fibrillation: Secondary | ICD-10-CM

## 2019-11-06 ENCOUNTER — Ambulatory Visit: Payer: Medicare HMO | Attending: Internal Medicine

## 2019-11-06 DIAGNOSIS — Z23 Encounter for immunization: Secondary | ICD-10-CM

## 2019-11-06 NOTE — Progress Notes (Signed)
   Covid-19 Vaccination Clinic  Name:  Amanda Frazier    MRN: [id number] DOB: 1945-10-05  11/06/2019  Amanda Frazier was observed post Covid-19 immunization for 15 minutes without incident. She was provided with Vaccine Information Sheet and instruction to access the V-Safe system.   Amanda Frazier was instructed to call 911 with any severe reactions post vaccine: Marland Kitchen Difficulty breathing  . Swelling of face and throat  . A fast heartbeat  . A bad rash all over body  . Dizziness and weakness   Immunizations Administered    Name Date Dose VIS Date Route   Pfizer COVID-19 Vaccine 11/06/2019  2:15 PM 0.3 mL 08/08/2019 Intramuscular   Manufacturer: [place]   Lot: [id number]   [place]: [id number]

## 2019-11-17 ENCOUNTER — Encounter: Payer: Self-pay | Admitting: Cardiology

## 2019-11-17 ENCOUNTER — Other Ambulatory Visit: Payer: Self-pay

## 2019-11-17 ENCOUNTER — Ambulatory Visit: Payer: Medicare HMO | Admitting: Cardiology

## 2019-11-17 VITALS — BP 135/56 | HR 48 | Temp 98.8°F | Resp 16 | Ht 64.0 in | Wt 189.6 lb

## 2019-11-17 DIAGNOSIS — I10 Essential (primary) hypertension: Secondary | ICD-10-CM

## 2019-11-17 DIAGNOSIS — R001 Bradycardia, unspecified: Secondary | ICD-10-CM

## 2019-11-17 DIAGNOSIS — I480 Paroxysmal atrial fibrillation: Secondary | ICD-10-CM

## 2019-11-17 DIAGNOSIS — E7800 Pure hypercholesterolemia, unspecified: Secondary | ICD-10-CM

## 2019-11-17 DIAGNOSIS — N1831 Chronic kidney disease, stage 3a: Secondary | ICD-10-CM

## 2019-11-17 NOTE — Progress Notes (Signed)
Primary Physician/Referring:  [name], MD  Patient ID: Amanda Frazier, female    DOB: [birth year], 74 y.o.   MRN: [id number]  Chief Complaint  Patient presents with  . Paryoxsmal Atrial Fibrillation  . Hypertension  . Follow-up    4 week   HPI:    Amanda Frazier  is a 74 y.o. Caucasian female with hypertension, hyperlipidemia, hyperglycemia seen in the emergency room on 10/01/2019 for atrial fibrillation.  Patient asymptomatic, was checking her blood pressure and blood pressure recorder suggested irregular heartbeat.  She called her PCP and was sent over to the emergency room after EKG demonstrated atrial fibrillation.  In the ED she was ruled out for myocardial infarction, discharged home with diltiazem and Eliquis on board.  Remains asymptomatic.  She spontaneously to sinus rhythm per patient 4 to 5 days later.    She now presents for follow-up, states that since making the pressure medication changes she has noticed marked improvement in blood pressure in fact her blood pressure has been running 120 to 562 mmHg systolic at home.  Has noticed occasional episodes of dizziness when she suddenly stands up but otherwise feels well overall.  Past Medical History:  Diagnosis Date  . Atrial fibrillation ([place])   . Hyperlipidemia   . Hypertension    Past Surgical History:  Procedure Laterality Date  . REPLACEMENT TOTAL KNEE Left 2015   Social History   Tobacco Use  . Smoking status: Never Smoker  . Smokeless tobacco: Never Used  Substance Use Topics  . Alcohol use: Never   ROS  Review of Systems  Constitution: Positive for weight loss (30 lbs in last 6-8 months intentional).  Cardiovascular: Negative for dyspnea on exertion and leg swelling.  Gastrointestinal: Negative for melena.   Objective  Blood pressure (!) 135/56, pulse (!) 48, temperature 98.8 F (37.1 C), temperature source Temporal, resp. rate 16, height _0  (1.626 m), weight 189 lb 9.6 oz (86 kg),  SpO2 96 %.  Vitals with BMI 11/17/2019 10/15/2019  Height _1  _2   Weight 189 lbs 10 oz 199 lbs  BMI 56.38 93.73  Systolic 428 768  Diastolic 56 68  Pulse 48 57     Physical Exam  Cardiovascular: Normal rate, regular rhythm, normal heart sounds and intact distal pulses. Exam reveals no gallop.  No murmur heard. No leg edema, no JVD.  Pulmonary/Chest: Effort normal and breath sounds normal.  Abdominal: Soft. Bowel sounds are normal.  Skin: Skin is warm and dry.   Laboratory examination:    TSH Recent Labs    10/29/19 1128  TSH 2.060     Recent Labs    10/29/19 1128  TSH 2.060   External labs:   Labs 10/01/2019: Hb 12.7/HCT 40.0, platelets 324.  Serum glucose 116 mg, BUN 18, creatinine 1.09, EGFR 50 mL.  Calcium minimally elevated at 10.4, CMP otherwise normal.    Medications and allergies   Allergies  Allergen Reactions  . Atorvastatin Other (See Comments)    Fatigue  . Celecoxib Nausea And Vomiting  . Other Nausea And Vomiting  . Fosamax [Alendronate Sodium] Nausea And Vomiting  . Lipitor [Atorvastatin Calcium]     Fatigue  . Benazepril     Other reaction(s): Cough  . Nickel     Other reaction(s): Other (See Comments) Positive patch test  . Palladium Chloride     Other reaction(s): Other (See Comments) Positive patch test  . Simvastatin Diarrhea  . Valsartan  Other reaction(s): Cough    Current Outpatient Medications  Medication Instructions  . apixaban (ELIQUIS) 5 mg, Oral, 2 times daily  . calcium carbonate (TUMS - DOSED IN MG ELEMENTAL CALCIUM) 500 MG chewable tablet 1 tablet, Oral, 2 times daily  . cloNIDine (CATAPRES) 0.1 mg, Oral, 2 times daily  . diltiazem (TIAZAC) 180 mg, Oral, Daily  . labetalol (NORMODYNE) 200 mg, Oral, 2 times daily  . omeprazole (PRILOSEC) 20 MG capsule 1 capsule, Oral, Daily  . rosuvastatin (CRESTOR) 10 mg, Oral, Daily  . spironolactone-hydrochlorothiazide (ALDACTAZIDE) 25-25 MG tablet 1 tablet, Oral, BH-each  morning   Radiology:   Chest x-ray PA and lateral view 09/30/2019: There is minimal scarring versus atelectasis on the lateral left lung base. No focal infiltrate is seen. There is no pleural effusion or pneumothorax. Cardiac silhouette is borderline enlarged. Mediastinal contours are normal. There are mild degenerative changes at the shoulders and throughout the spine.  Impression:  Borderline cardiomegaly. No visualized acute abnormality.   Cardiac Studies:   Lexiscan (Walking with mod Bruce)Tetrofosmin Stress Test  10/27/2019:  Nondiagnostic ECG stress. Inferior and inferolateral breast tissue attenuation noted. Myocardial perfusion is normal. Overall LV systolic function is normal without regional wall motion abnormalities. Stress LV EF: 55%.  No previous exam available for comparison. Low risk study.   Echocardiogram 11/05/2019:  Normal LV systolic function with visual EF 50-55%. No obvious regional  wall motion abnormalities. Normal wall thickness. Left ventricle cavity is  normal in size. Normal global wall motion. Left atrial cavity is mildly dilated .  Mild (Grade I) mitral regurgitation. Mild tricuspid regurgitation. Mild pulmonic regurgitation.  No prior study for compairison.  EKG:  EKG 10/15/2019: Sinus bradycardia at the rate of 51 bpm, normal axis, incomplete right bundle branch block.  No evidence of ischemia, normal QT interval.   EKG 10/01/2019: Atrial fibrillation with rapid ventricular response with rate of 111 bpm, normal axis, nonspecific ST depression in V1 and V2, cannot exclude septal ischemia.  Low-voltage complexes.  Abnormal EKG.  Assessment     ICD-10-CM   1. Paroxysmal atrial fibrillation ([place]). CHA2DS2-VASc Score is 3.  Yearly risk of stroke: 3.2% (A, F, HTN)  I48.0   2. Primary hypertension  I10   3. Hypercholesteremia  E78.00   4. Sinus bradycardia by electrocardiogram  R00.1    No orders of the defined types were placed in this encounter.    There are no discontinued medications.   Recommendations:   Amanda Frazier  is a 74 y.o. Caucasian female with hypertension, hyperlipidemia, hyperglycemia seen in the emergency room on 10/01/2019 for atrial fibrillation. Patient asymptomatic, was checking her blood pressure and blood pressure recorder suggested irregular heartbeat, EKG was showing A. fib with RVR.  In the ED she was ruled out for MI and discharged home on Eliquis and diltiazem.  She remained asymptomatic throughout.  Converted back to sinus rhythm 4-5 days later per patient.  Etiology for atrial fibrillation includes uncontrolled hypertension, and obesity. She has lost about 30 pounds in weight. BP is now well controlled, she continues to have asymptomatic bradycardia.  Previously was on high dose of clonidine which I weaned her down to 0.1 mg p.o. twice daily, advised her to discontinue clonidine and continue to monitor her blood pressure.  With weight loss that she has done a great job suspect her blood pressure will improve.  She is also tolerating Aldactazide, I will check BMP today.   She is now tolerating diltiazem and also labetalol.  In spite of being bradycardic, she is essentially asymptomatic.  Hopefully completely discontinuing clonidine may help with heart rate improvement and also she is having occasional episodes of dizziness when she suddenly stands up and this may improve as well.  She has noticed a heart rate to be stable and is also noticed her blood pressure has been very well controlled.  Home recordings have been at the most 130 mmHg.   Would like to see her back in 6 months with repeat EKG.  I reviewed her stress test, low risk stress test and echocardiogram is essentially normal.  Adrian Prows, MD, [facility] 11/17/2019, 1:46 PM [facility] [place] Office: [phone]

## 2019-11-22 LAB — BASIC METABOLIC PANEL
BUN/Creatinine Ratio: 18 (ref 12–28)
BUN: 25 mg/dL (ref 8–27)
CO2: 20 mmol/L (ref 20–29)
Calcium: 9.9 mg/dL (ref 8.7–10.3)
Chloride: 100 mmol/L (ref 96–106)
Creatinine, Ser: 1.38 mg/dL — ABNORMAL HIGH (ref 0.57–1.00)
GFR calc Af Amer: 43 mL/min/{1.73_m2} — ABNORMAL LOW (ref >59)
GFR calc non Af Amer: 38 mL/min/{1.73_m2} — ABNORMAL LOW (ref >59)
Glucose: 100 mg/dL — ABNORMAL HIGH (ref 65–99)
Potassium: 4.7 mmol/L (ref 3.5–5.2)
Sodium: 136 mmol/L (ref 134–144)

## 2019-11-22 NOTE — Addendum Note (Signed)
Addended by: Kela Millin on: 11/22/2019 10:06 AM   Modules accepted: Orders

## 2019-12-01 ENCOUNTER — Ambulatory Visit: Payer: Medicare HMO | Attending: Internal Medicine

## 2019-12-01 DIAGNOSIS — Z23 Encounter for immunization: Secondary | ICD-10-CM

## 2019-12-01 NOTE — Telephone Encounter (Signed)
From patient.

## 2019-12-01 NOTE — Progress Notes (Signed)
   Covid-19 Vaccination Clinic  Name:  Amanda Frazier    MRN: [id number] DOB: [birth year]  12/01/2019  [name] was observed post Covid-19 immunization for 15 minutes without incident. She was provided with Vaccine Information Sheet and instruction to access the V-Safe system.   [name] was instructed to call 911 with any severe reactions post vaccine: Marland Kitchen Difficulty breathing  . Swelling of face and throat  . A fast heartbeat  . A bad rash all over body  . Dizziness and weakness   Immunizations Administered    Name Date Dose VIS Date Route   Pfizer COVID-19 Vaccine 12/01/2019 10:19 AM 0.3 mL 08/08/2019 Intramuscular   Manufacturer: [place]   Lot: [id number]   [place]: [id number]

## 2019-12-10 LAB — BASIC METABOLIC PANEL
BUN/Creatinine Ratio: 18 (ref 12–28)
BUN: 22 mg/dL (ref 8–27)
CO2: 22 mmol/L (ref 20–29)
Calcium: 10.1 mg/dL (ref 8.7–10.3)
Chloride: 101 mmol/L (ref 96–106)
Creatinine, Ser: 1.23 mg/dL — ABNORMAL HIGH (ref 0.57–1.00)
GFR calc Af Amer: 50 mL/min/{1.73_m2} — ABNORMAL LOW (ref >59)
GFR calc non Af Amer: 43 mL/min/{1.73_m2} — ABNORMAL LOW (ref >59)
Glucose: 105 mg/dL — ABNORMAL HIGH (ref 65–99)
Potassium: 4.6 mmol/L (ref 3.5–5.2)
Sodium: 138 mmol/L (ref 134–144)

## 2020-01-06 ENCOUNTER — Other Ambulatory Visit: Payer: Self-pay | Admitting: Cardiology

## 2020-01-06 DIAGNOSIS — I10 Essential (primary) hypertension: Secondary | ICD-10-CM

## 2020-01-09 ENCOUNTER — Other Ambulatory Visit: Payer: Self-pay | Admitting: Cardiology

## 2020-01-09 DIAGNOSIS — I10 Essential (primary) hypertension: Secondary | ICD-10-CM

## 2020-01-14 ENCOUNTER — Other Ambulatory Visit: Payer: Self-pay | Admitting: Cardiology

## 2020-01-14 DIAGNOSIS — I10 Essential (primary) hypertension: Secondary | ICD-10-CM

## 2020-03-30 DIAGNOSIS — U071 COVID-19: Secondary | ICD-10-CM | POA: Insufficient documentation

## 2020-03-30 DIAGNOSIS — E785 Hyperlipidemia, unspecified: Secondary | ICD-10-CM | POA: Insufficient documentation

## 2020-03-30 DIAGNOSIS — I10 Essential (primary) hypertension: Secondary | ICD-10-CM | POA: Insufficient documentation

## 2020-03-30 DIAGNOSIS — E7800 Pure hypercholesterolemia, unspecified: Secondary | ICD-10-CM | POA: Insufficient documentation

## 2020-03-30 DIAGNOSIS — Z8616 Personal history of COVID-19: Secondary | ICD-10-CM

## 2020-03-30 DIAGNOSIS — I4891 Unspecified atrial fibrillation: Secondary | ICD-10-CM | POA: Insufficient documentation

## 2020-03-30 DIAGNOSIS — R7989 Other specified abnormal findings of blood chemistry: Secondary | ICD-10-CM | POA: Insufficient documentation

## 2020-03-30 HISTORY — DX: Personal history of COVID-19: Z86.16

## 2020-04-28 ENCOUNTER — Other Ambulatory Visit: Payer: Self-pay | Admitting: Family Medicine

## 2020-04-28 ENCOUNTER — Ambulatory Visit
Admission: RE | Admit: 2020-04-28 | Discharge: 2020-04-28 | Disposition: A | Discharge status: Home / Self Care | Payer: Medicare HMO | Source: Ambulatory Visit | Attending: Family Medicine | Admitting: Family Medicine

## 2020-04-28 DIAGNOSIS — Z8616 Personal history of COVID-19: Secondary | ICD-10-CM

## 2020-05-06 ENCOUNTER — Other Ambulatory Visit: Payer: Self-pay | Admitting: Family Medicine

## 2020-05-06 DIAGNOSIS — J9859 Other diseases of mediastinum, not elsewhere classified: Secondary | ICD-10-CM

## 2020-05-10 DIAGNOSIS — I5030 Unspecified diastolic (congestive) heart failure: Secondary | ICD-10-CM | POA: Insufficient documentation

## 2020-05-10 DIAGNOSIS — I5032 Chronic diastolic (congestive) heart failure: Secondary | ICD-10-CM | POA: Insufficient documentation

## 2020-05-10 DIAGNOSIS — I5041 Acute combined systolic (congestive) and diastolic (congestive) heart failure: Secondary | ICD-10-CM | POA: Insufficient documentation

## 2020-05-10 HISTORY — DX: Unspecified diastolic (congestive) heart failure: I50.30

## 2020-05-12 HISTORY — PX: CARDIOVERSION: SHX1299

## 2020-05-17 ENCOUNTER — Telehealth: Payer: Self-pay

## 2020-05-17 NOTE — Telephone Encounter (Signed)
Looks like she went to ED with AF and  was cardioverted. So no cardioversion tomorrow

## 2020-05-18 ENCOUNTER — Other Ambulatory Visit: Payer: Medicare HMO

## 2020-05-18 HISTORY — PX: CARDIOVERSION: SHX1299

## 2020-05-19 ENCOUNTER — Ambulatory Visit: Payer: Medicare HMO | Admitting: Cardiology

## 2020-05-24 ENCOUNTER — Other Ambulatory Visit: Payer: Self-pay | Admitting: Cardiology

## 2020-05-24 DIAGNOSIS — I480 Paroxysmal atrial fibrillation: Secondary | ICD-10-CM

## 2020-05-24 NOTE — Progress Notes (Addendum)
Primary Physician/Referring:  [name], MD  Patient ID: Amanda Frazier, female    DOB: 05-Sep-1945, 74 y.o.   MRN: [id number]  Chief Complaint  Patient presents with  . Atrial Fibrillation  . Hypertension  . Follow-up    6 month   HPI:    Amanda Frazier  is a 74 y.o. Caucasian female with hypertension, hyperlipidemia, hyperglycemia seen in the emergency room on 10/01/2019 for atrial fibrillation.  She was admitted to the hospital on 03/30/2020 with Covid pneumonia, and again admitted on 05/10/2020 for A. fib with RVR.  She was recently seen in the emergency department at [facility] 05/16/2021 in A. fib with RVR and heart failure, and underwent successful cardioversion on 05/18/2020.  Upon discharge patient's diltiazem and Aldactazide were stopped, she was started on amiodarone 200 mg twice daily and furosemide 20 mg once daily.   She presents today for follow-up.  She states that since discharge from the hospital she has been doing well, although she has not been active and in fact has been very careful "not to stress her heart".  Patient reports that when she goes into atrial fibrillation she does not feel palpitations, however she feels significant fatigue.  Currently she denies dizziness, syncope, chest pain, shortness of breath, fatigue, palpitations, PND, orthopnea, swelling.  She has been taking amiodarone 200 mg, metoprolol titrate 50 twice daily and tolerating this well.  She monitors her blood pressure regularly at home and reports numbers from 115-120/60-68.  Past Medical History:  Diagnosis Date  . Atrial fibrillation ([place])   . Hyperlipidemia   . Hypertension    Past Surgical History:  Procedure Laterality Date  . CARDIOVERSION  2021  . REPLACEMENT TOTAL KNEE Left 2015   Social History   Tobacco Use  . Smoking status: Never Smoker  . Smokeless tobacco: Never Used  Substance Use Topics  . Alcohol use: Never  Marital Status: Married  ROS  Review of Systems   Constitutional: Positive for malaise/fatigue and weight loss (30 lbs in last 6-8 months intentional).  Cardiovascular: Negative for chest pain, claudication, dyspnea on exertion, leg swelling, orthopnea, palpitations, paroxysmal nocturnal dyspnea and syncope.  Respiratory: Negative for shortness of breath.   Gastrointestinal: Negative for melena.   Objective  Blood pressure 134/72, pulse 60, resp. rate 16, height $RemoveBe'5\' 4"'GIoIaLQmx$  (1.626 m), weight 175 lb (79.4 kg), SpO2 96 %.  Vitals with BMI 05/25/2020 11/17/2019 10/15/2019  Height $Remov'5\' 4"'LrFwcH$  $Remove'5\' 4"'QEZCSBB$  $RemoveB'5\' 4"'QCMllPQS$   Weight 175 lbs 189 lbs 10 oz 199 lbs  BMI 30.02 72.53 66.44  Systolic 034 742 595  Diastolic 72 56 68  Pulse 60 48 57     Physical Exam Cardiovascular:     Rate and Rhythm: Regular rhythm. Bradycardia present.     Pulses: Intact distal pulses.          Carotid pulses are 2+ on the right side and 2+ on the left side.      Radial pulses are 2+ on the right side and 2+ on the left side.       Posterior tibial pulses are 2+ on the right side and 2+ on the left side.     Heart sounds: Normal heart sounds, S1 normal and S2 normal. No murmur heard.  No gallop.      Comments: No leg edema, no JVD. Pulmonary:     Effort: Pulmonary effort is normal.     Breath sounds: Normal breath sounds.  Abdominal:     General:  Bowel sounds are normal.     Palpations: Abdomen is soft.    Laboratory examination:   CMP Latest Ref Rng & Units 12/09/2019 11/21/2019 08/07/2014  Glucose 65 - 99 mg/dL 105(H) 100(H) 136(H)  BUN 8 - 27 mg/dL _0 Creatinine 0.57 - 1.00 mg/dL 1.23(H) 1.38(H) 1.17  Sodium 134 - 144 mmol/L 138 136 137  Potassium 3.5 - 5.2 mmol/L 4.6 4.7 3.4(L)  Chloride 96 - 106 mmol/L 101 100 101  CO2 20 - 29 mmol/L _1 Calcium 8.7 - 10.3 mg/dL 10.1 9.9 8.3(L)   CBC Latest Ref Rng & Units 08/07/2014 08/06/2014 07/22/2014  WBC 3.6 - 11.0 x10 3/mm 3 - - 10.5  Hemoglobin 12.0 - 16.0 g/dL 10.4(L) 10.7(L) 12.1  Hematocrit 35.0 - 47.0 % - - 37.1   Platelets 150 - 440 x10 3/mm 3 274 274 342   Lipid Panel  No results found for: CHOL, TRIG, HDL, CHOLHDL, VLDL, LDLCALC, LDLDIRECT HEMOGLOBIN A1C No results found for: HGBA1C, MPG TSH Recent Labs    10/29/19 1128  TSH 2.060   External labs:  12/09/2019: BUN 22, SrCr 1.23  10/29/2019: TSH 2.06  Labs 10/01/2019: Hb 12.7/HCT 40.0, platelets 324.  Serum glucose 116 mg, BUN 18, creatinine 1.09, EGFR 50 mL.  Calcium minimally elevated at 10.4, CMP otherwise normal.    05/20/2019: HDL 53, LDL 103, triglycerides 121, total 180  Medications and allergies   Allergies  Allergen Reactions  . Atorvastatin Other (See Comments)    Fatigue  . Celecoxib Nausea And Vomiting  . Other Nausea And Vomiting  . Fosamax [Alendronate Sodium] Nausea And Vomiting  . Lipitor [Atorvastatin Calcium]     Fatigue  . Benazepril     Other reaction(s): Cough  . Nickel     Other reaction(s): Other (See Comments) Positive patch test  . Palladium Chloride     Other reaction(s): Other (See Comments) Positive patch test  . Simvastatin Diarrhea  . Valsartan     Other reaction(s): Cough    Current Outpatient Medications  Medication Instructions  . amiodarone (PACERONE) 100 mg, Oral, 2 times daily  . calcium carbonate (TUMS - DOSED IN MG ELEMENTAL CALCIUM) 500 MG chewable tablet 1 tablet, Oral, 2 times daily  . Cholecalciferol 1.25 MG (50000 UT) TABS 1 tablet, Oral, Daily  . Dextromethorphan-guaiFENesin (MUCINEX DM) 30-600 MG TB12 As needed  . ELIQUIS 5 MG TABS tablet TAKE 1 TABLET BY MOUTH TWICE A DAY  . furosemide (LASIX) 20 mg, Oral, As needed  . metoprolol tartrate (LOPRESSOR) 50 mg, Oral, 2 times daily  . omeprazole (PRILOSEC) 20 MG capsule 1 capsule, Oral, Daily  . rosuvastatin (CRESTOR) 10 mg, Oral, Daily   Radiology:   Chest x-ray PA and lateral view 09/30/2019: There is minimal scarring versus atelectasis on the lateral left lung base. No focal infiltrate is seen. There is no pleural  effusion or pneumothorax. Cardiac silhouette is borderline enlarged. Mediastinal contours are normal. There are mild degenerative changes at the shoulders and throughout the spine. Impression:  Borderline cardiomegaly. No visualized acute abnormality.   Chest x-ray AP 05/16/2020:  Stable left greater than right effusions and bibasilar atelectasis. Cannot exclude developing infection.    Cardiac Studies:  Lexiscan (Walking with mod Bruce)Tetrofosmin Stress Test  10/27/2019:  Nondiagnostic ECG stress. Inferior and inferolateral breast tissue attenuation noted. Myocardial perfusion is normal. Overall LV systolic function is normal without regional wall motion abnormalities. Stress LV EF: 55%.  No previous exam available for  comparison. Low risk study.   Echocardiogram 11/05/2019:  Normal LV systolic function with visual EF 50-55%. No obvious regional  wall motion abnormalities. Normal wall thickness. Left ventricle cavity is  normal in size. Normal global wall motion. Left atrial cavity is mildly dilated .  Mild (Grade I) mitral regurgitation. Mild tricuspid regurgitation. Mild pulmonic regurgitation.  No prior study for compairison.  Echocardiogram 05/17/2020:  MitralValve: There is mild posterior annular calcification.  MitralValve: Mitral valve structure is normal. The leaflets are mildly thickened and exhibit normal excursion.  LeftVentricle: Systolic function is normal. EF: 57-60%.  LeftVentricle: Wall motion is normal.  Pericardium: There is a pericardial effusion noted. Moderate pericardial effusion noted anteriorly around RV at the most 14 mm. Layer of echodense structure noted attached to RV from pericardial side.   Etiology is not clear but thrombus or mass or artifact are on differential. No RV or RA collapse was noted. No evidence of pericardial tamponade.   Compared to previous study from September 14 pericardial effusion has decreased and now located mainly  anteriorly around the RV and no evidence of pericardial effusion around LV or RA or LA. Echodense structure which  was noted in pericardial space on previous study also has decreased in size.  It was limited study to reevaluate pericardial effusion.   EKG:  EKG 05/25/2020:Sinus bradycardia at a rate of 57 bpm with first-degree AV block, left atrial enlargement.  Normal axis.  Incomplete right bundle branch block.  Poor R wave progression, cannot exclude anterior infarct old.  Nonspecific T wave abnormality.  Compared to EKG 10/15/2019, first-degree AV block and left atrial enlargement new.  EKG 10/01/2019: Atrial fibrillation with rapid ventricular response with rate of 111 bpm, normal axis, nonspecific ST depression in V1 and V2, cannot exclude septal ischemia.  Low-voltage complexes.  Abnormal EKG.  Assessment     ICD-10-CM   1. Paroxysmal atrial fibrillation ([place]). CHA2DS2-VASc Score is 3.  Yearly risk of stroke: 3.2% (A, F, HTN)  I48.0 EKG 12-Lead    amiodarone (PACERONE) 200 MG tablet    metoprolol tartrate (LOPRESSOR) 50 MG tablet  2. Primary hypertension  I10 metoprolol tartrate (LOPRESSOR) 50 MG tablet    furosemide (LASIX) 20 MG tablet  3. Hypercholesteremia  E78.00   4. Chronic systolic (congestive) heart failure (HCC)  I50.22 furosemide (LASIX) 20 MG tablet   Meds ordered this encounter  Medications  . amiodarone (PACERONE) 200 MG tablet    Sig: Take 0.5 tablets (100 mg total) by mouth in the morning and at bedtime.    Dispense:  90 tablet    Refill:  3  . metoprolol tartrate (LOPRESSOR) 50 MG tablet    Sig: Take 1 tablet (50 mg total) by mouth 2 (two) times daily.    Dispense:  90 tablet    Refill:  3  . furosemide (LASIX) 20 MG tablet    Sig: Take 1 tablet (20 mg total) by mouth as needed for fluid or edema.    Dispense:  30 tablet    Refill:  3    Medications Discontinued During This Encounter  Medication Reason  . cloNIDine (CATAPRES) 0.1 MG tablet Discontinued  by provider  . labetalol (NORMODYNE) 200 MG tablet Discontinued by provider  . spironolactone-hydrochlorothiazide (ALDACTAZIDE) 25-25 MG tablet Discontinued by provider  . TIADYLT ER 180 MG 24 hr capsule Discontinued by provider  . amiodarone (PACERONE) 200 MG tablet Change in therapy  . metoprolol tartrate (LOPRESSOR) 50 MG tablet Reorder  . furosemide (LASIX)  20 MG tablet Reorder     Recommendations:   WYNDI NORTHRUP  is a 74 y.o. Caucasian female with hypertension, hyperlipidemia, hyperglycemia seen in the emergency room on 10/01/2019 for atrial fibrillation.  She was admitted to the hospital on 03/30/2020 with Covid pneumonia, and again admitted on 05/10/2020 for A. fib with RVR.  She was recently seen in the emergency department at [facility] 05/16/2021 in A. fib with RVR and heart failure, and underwent successful cardioversion on 05/18/2020.   Etiology for atrial fibrillation includes uncontrolled hypertension, and obesity.  Since last visit on 11/17/2019 patient has lost an additional 14 pounds, patient was congratulated for her hard work.  Discussed the importance today of continued weight loss and encouraged her to continue focusing on healthy diet and active lifestyle.  There are currently no clinical signs of heart failure. She is currently tolerating metoprolol tartrate 50 mg twice daily, amiodarone 200 mg once daily and Eliquis 5 mg twice daily.  She does continue to have asymptomatic bradycardia, I reviewed her EKG and she is maintaining sinus rhythm.  We will continue metoprolol tartrate 50 mg twice daily and Eliquis 5 mg twice daily, as she is tolerating without bleeding diathesis.  Will reduce amiodarone from 200 mg once a day to 100 mg once daily.  Could consider reducing this further to 100 mg every other day if patient maintains sinus rhythm for the next 3 months.  Patient asked about ablation for atrial fibrillation, as she is maintaining sinus rhythm at this time on amiodarone and  metoprolol, will not refer for evaluation for ablation at this point.  Of note patient's blood pressure is mildly elevated in the office today however she reports home blood pressure readings are well controlled.  Encourage patient to continue daily weight monitoring.  She is currently taking furosemide 20 mg once daily, will change this to as needed for volume overload.  Discussed with patient signs and symptoms that should prompt her to take furosemide 20 mg once daily, she expressed understanding.  I reviewed labs, there is no recent lipid panel to evaluate.  Patient states her primary care provider regularly does lipid profile testing and manages her hyperlipidemia.  She will continue taking 10 mg of rosuvastatin daily.  I also reviewed imaging and lab work from recent hospital stays.  Echocardiogram revealed ejection fraction remained stable at 57 to 60%.  Patient also had pericardial effusion during admission which has improved and is without evidence of cardiac tamponade, will continue to monitor.  Discussed with patient precautions of signs and symptoms that should prompt her to report to the emergency department for further evaluation and treatment, expressed understanding.  Follow-up in 3 months for atrial fibrillation, heart failure, pericardial effusion.   Patient was seen in collaboration with Dr. Einar Gip. He also reviewed patient's chart and examined the patient. Dr. Einar Gip is in agreement of the plan.    This was a 40-minute encounter with face-to-face counseling, medical records review, coordination of care, explanation of complex medical issues, complex medical decision making.    Alethia Berthold, PA-C 05/25/2020, 5:42 PM Office: [phone]  Will obtain echocardiogram to reevaluate pericardial effusion in about 3 months, prior to next visit.

## 2020-05-25 ENCOUNTER — Encounter: Payer: Self-pay | Admitting: Cardiology

## 2020-05-25 ENCOUNTER — Other Ambulatory Visit: Payer: Self-pay

## 2020-05-25 ENCOUNTER — Ambulatory Visit: Payer: Medicare HMO | Admitting: Cardiology

## 2020-05-25 VITALS — BP 134/72 | HR 60 | Resp 16 | Ht 64.0 in | Wt 175.0 lb

## 2020-05-25 DIAGNOSIS — E7800 Pure hypercholesterolemia, unspecified: Secondary | ICD-10-CM

## 2020-05-25 DIAGNOSIS — I313 Pericardial effusion (noninflammatory): Secondary | ICD-10-CM

## 2020-05-25 DIAGNOSIS — I480 Paroxysmal atrial fibrillation: Secondary | ICD-10-CM

## 2020-05-25 DIAGNOSIS — I5022 Chronic systolic (congestive) heart failure: Secondary | ICD-10-CM

## 2020-05-25 DIAGNOSIS — I10 Essential (primary) hypertension: Secondary | ICD-10-CM

## 2020-05-25 DIAGNOSIS — I3139 Other pericardial effusion (noninflammatory): Secondary | ICD-10-CM

## 2020-05-25 MED ORDER — METOPROLOL TARTRATE 50 MG PO TABS: 50.0000 mg | ORAL_TABLET | Freq: Two times a day (BID) | ORAL | 3 refills | Status: DC

## 2020-05-25 MED ORDER — AMIODARONE HCL 200 MG PO TABS: 100.0000 mg | ORAL_TABLET | Freq: Two times a day (BID) | ORAL | 3 refills | Status: DC

## 2020-05-25 MED ORDER — FUROSEMIDE 20 MG PO TABS: 20.0000 mg | ORAL_TABLET | ORAL | 3 refills | Status: DC | PRN

## 2020-06-07 ENCOUNTER — Other Ambulatory Visit: Payer: Self-pay

## 2020-06-07 ENCOUNTER — Ambulatory Visit
Admission: RE | Admit: 2020-06-07 | Discharge: 2020-06-07 | Disposition: A | Discharge status: Home / Self Care | Payer: Medicare HMO | Source: Ambulatory Visit | Attending: Family Medicine | Admitting: Family Medicine

## 2020-06-07 DIAGNOSIS — J9859 Other diseases of mediastinum, not elsewhere classified: Secondary | ICD-10-CM

## 2020-06-07 DIAGNOSIS — I272 Pulmonary hypertension, unspecified: Secondary | ICD-10-CM

## 2020-06-07 HISTORY — DX: Pulmonary hypertension, unspecified: I27.20

## 2020-07-07 ENCOUNTER — Other Ambulatory Visit: Payer: Self-pay | Admitting: Cardiology

## 2020-07-07 DIAGNOSIS — I10 Essential (primary) hypertension: Secondary | ICD-10-CM

## 2020-07-29 ENCOUNTER — Other Ambulatory Visit: Payer: Medicare HMO

## 2020-07-29 ENCOUNTER — Ambulatory Visit: Payer: Medicare HMO

## 2020-07-29 ENCOUNTER — Other Ambulatory Visit: Payer: Self-pay

## 2020-07-29 DIAGNOSIS — I3139 Other pericardial effusion (noninflammatory): Secondary | ICD-10-CM

## 2020-07-29 DIAGNOSIS — I5022 Chronic systolic (congestive) heart failure: Secondary | ICD-10-CM

## 2020-07-29 DIAGNOSIS — I313 Pericardial effusion (noninflammatory): Secondary | ICD-10-CM

## 2020-08-02 NOTE — Progress Notes (Signed)
Discuss at upcoming office visit.

## 2020-08-05 ENCOUNTER — Ambulatory Visit: Payer: Medicare HMO | Admitting: Cardiology

## 2020-08-05 ENCOUNTER — Encounter: Payer: Self-pay | Admitting: Cardiology

## 2020-08-05 ENCOUNTER — Other Ambulatory Visit: Payer: Self-pay

## 2020-08-05 VITALS — BP 153/57 | HR 45 | Ht 64.0 in | Wt 179.0 lb

## 2020-08-05 DIAGNOSIS — I313 Pericardial effusion (noninflammatory): Secondary | ICD-10-CM

## 2020-08-05 DIAGNOSIS — I10 Essential (primary) hypertension: Secondary | ICD-10-CM

## 2020-08-05 DIAGNOSIS — I5022 Chronic systolic (congestive) heart failure: Secondary | ICD-10-CM

## 2020-08-05 DIAGNOSIS — I3139 Other pericardial effusion (noninflammatory): Secondary | ICD-10-CM

## 2020-08-05 DIAGNOSIS — I480 Paroxysmal atrial fibrillation: Secondary | ICD-10-CM

## 2020-08-05 MED ORDER — METOPROLOL TARTRATE 25 MG PO TABS: 25.0000 mg | ORAL_TABLET | Freq: Two times a day (BID) | ORAL | 3 refills | Status: DC

## 2020-08-05 NOTE — Progress Notes (Signed)
Primary Physician/Referring:  [name], MD  Patient ID: Amanda Frazier, female    DOB: Nov 19, 1945, 74 y.o.   MRN: [id number]  No chief complaint on file.  HPI:    Amanda Frazier  is a 74 y.o. Caucasian female with hypertension, hyperlipidemia, hyperglycemia seen in the emergency room on 10/01/2019 for atrial fibrillation.  She was admitted to the hospital on 03/30/2020 with Covid pneumonia, and again admitted on 05/10/2020 for A. fib with RVR.  She was recently seen in the emergency department at [facility] 05/16/2021 in A. fib with RVR and heart failure, and underwent successful cardioversion on 05/18/2020.    Patient presents for 75 month follow up of atrial fibrillation, heart failure, and pericardial effusion. At last visit reduced amiodarone from 200 mg to 100 mg daily as patient has maintained sinus rhythm. Also at last visit switch furosemide from 20 mg daily to as needed use.  Patient states that since her last visit she has been feeling very well and her energy level has significantly improved.  She is without complaints today.  Denies chest pain, dyspnea, dizziness, syncope, fatigue, palpitations, PND, orthopnea, and swelling.  Patient does report she has noticed that her blood pressure has been mildly elevated on home readings.  Past Medical History:  Diagnosis Date  . Atrial fibrillation ([place])   . Hyperlipidemia   . Hypertension    Past Surgical History:  Procedure Laterality Date  . CARDIOVERSION  2021  . REPLACEMENT TOTAL KNEE Left 2015   Social History   Tobacco Use  . Smoking status: Never Smoker  . Smokeless tobacco: Never Used  Substance Use Topics  . Alcohol use: Never  Marital Status: Married  ROS  Review of Systems  Constitutional: Negative for malaise/fatigue and weight gain.  Cardiovascular: Negative for chest pain, claudication, dyspnea on exertion, leg swelling, near-syncope, orthopnea, palpitations, paroxysmal nocturnal dyspnea and syncope.   Respiratory: Negative for shortness of breath.   Hematologic/Lymphatic: Does not bruise/bleed easily.  Gastrointestinal: Negative for melena.  Neurological: Negative for dizziness and weakness.   Objective  Blood pressure (!) 153/57, pulse (!) 45, height 5' 4" (1.626 m), weight 179 lb (81.2 kg), SpO2 100 %.  Vitals with BMI 08/05/2020 08/05/2020 05/25/2020  Height - 5' 4" 5' 4"  Weight - 179 lbs 175 lbs  BMI - 90.30 09.23  Systolic 300 762 263  Diastolic 57 62 72  Pulse 45 73 60     Physical Exam Vitals reviewed.  HENT:     Head: Normocephalic and atraumatic.  Cardiovascular:     Rate and Rhythm: Regular rhythm. Bradycardia present.     Pulses: Intact distal pulses.          Carotid pulses are 2+ on the right side and 2+ on the left side.      Radial pulses are 2+ on the right side and 2+ on the left side.       Posterior tibial pulses are 2+ on the right side and 2+ on the left side.     Heart sounds: Normal heart sounds, S1 normal and S2 normal. No murmur heard. No gallop.      Comments: No leg edema, no JVD. Pulmonary:     Effort: Pulmonary effort is normal. No respiratory distress.     Breath sounds: Normal breath sounds. No wheezing, rhonchi or rales.  Abdominal:     General: Bowel sounds are normal.     Palpations: Abdomen is soft.  Musculoskeletal:  Right lower leg: No edema.     Left lower leg: No edema.  Neurological:     Mental Status: She is alert.    Laboratory examination:   CMP Latest Ref Rng & Units 12/09/2019 11/21/2019 08/07/2014  Glucose 65 - 99 mg/dL 105(H) 100(H) 136(H)  BUN 8 - 27 mg/dL _0 Creatinine 0.57 - 1.00 mg/dL 1.23(H) 1.38(H) 1.17  Sodium 134 - 144 mmol/L 138 136 137  Potassium 3.5 - 5.2 mmol/L 4.6 4.7 3.4(L)  Chloride 96 - 106 mmol/L 101 100 101  CO2 20 - 29 mmol/L _1 Calcium 8.7 - 10.3 mg/dL 10.1 9.9 8.3(L)   CBC Latest Ref Rng & Units 08/07/2014 08/06/2014 07/22/2014  WBC 3.6 - 11.0 x10 3/mm 3 - - 10.5  Hemoglobin  12.0 - 16.0 g/dL 10.4(L) 10.7(L) 12.1  Hematocrit 35.0 - 47.0 % - - 37.1  Platelets 150 - 440 x10 3/mm 3 274 274 342   Lipid Panel  No results found for: CHOL, TRIG, HDL, CHOLHDL, VLDL, LDLCALC, LDLDIRECT HEMOGLOBIN A1C No results found for: HGBA1C, MPG TSH Recent Labs    10/29/19 1128  TSH 2.060   External labs:  06/10/2020: HDL 43, LDL 90, total cholesterol 133, triglycerides 105 A1c 5.2% BUN 9.0, creatinine 1.23, EGFR 43 TSH 4.79  12/09/2019: BUN 22, SrCr 1.23  10/29/2019: TSH 2.06  Labs 10/01/2019: Hb 12.7/HCT 40.0, platelets 324.  Serum glucose 116 mg, BUN 18, creatinine 1.09, EGFR 50 mL.  Calcium minimally elevated at 10.4, CMP otherwise normal.    05/20/2019: HDL 53, LDL 103, triglycerides 121, total 180  Medications and allergies   Allergies  Allergen Reactions  . Atorvastatin Other (See Comments)    Fatigue  . Celecoxib Nausea And Vomiting  . Other Nausea And Vomiting  . Fosamax [Alendronate Sodium] Nausea And Vomiting  . Lipitor [Atorvastatin Calcium]     Fatigue  . Benazepril     Other reaction(s): Cough  . Nickel     Other reaction(s): Other (See Comments) Positive patch test  . Palladium Chloride     Other reaction(s): Other (See Comments) Positive patch test  . Simvastatin Diarrhea  . Valsartan     Other reaction(s): Cough    Current Outpatient Medications  Medication Instructions  . amiodarone (PACERONE) 100 mg, Oral, 2 times daily  . calcium carbonate (TUMS - DOSED IN MG ELEMENTAL CALCIUM) 500 MG chewable tablet 1 tablet, Oral, 2 times daily  . Cholecalciferol 1.25 MG (50000 UT) TABS 1 tablet, Oral, Daily  . Dextromethorphan-guaiFENesin (MUCINEX DM) 30-600 MG TB12 As needed  . ELIQUIS 5 MG TABS tablet TAKE 1 TABLET BY MOUTH TWICE A DAY  . ferrous sulfate 325 mg, Oral, Daily with breakfast  . furosemide (LASIX) 20 mg, Oral, As needed  . metoprolol tartrate (LOPRESSOR) 50 mg, Oral, 2 times daily  . omeprazole (PRILOSEC) 20 MG capsule 1  capsule, Oral, Daily  . rosuvastatin (CRESTOR) 10 mg, Oral, Daily   Radiology:   Chest x-ray PA and lateral view 09/30/2019: There is minimal scarring versus atelectasis on the lateral left lung base. No focal infiltrate is seen. There is no pleural effusion or pneumothorax. Cardiac silhouette is borderline enlarged. Mediastinal contours are normal. There are mild degenerative changes at the shoulders and throughout the spine. Impression:  Borderline cardiomegaly. No visualized acute abnormality.   Chest x-ray AP 05/16/2020:  Stable left greater than right effusions and bibasilar atelectasis. Cannot exclude developing infection.    Cardiac Studies:  Lexiscan (  Walking with mod Bruce)Tetrofosmin Stress Test  10/27/2019:  Nondiagnostic ECG stress. Inferior and inferolateral breast tissue attenuation noted. Myocardial perfusion is normal. Overall LV systolic function is normal without regional wall motion abnormalities. Stress LV EF: 55%.  No previous exam available for comparison. Low risk study.   Echocardiogram 11/05/2019:  Normal LV systolic function with visual EF 50-55%. No obvious regional  wall motion abnormalities. Normal wall thickness. Left ventricle cavity is  normal in size. Normal global wall motion. Left atrial cavity is mildly dilated .  Mild (Grade I) mitral regurgitation. Mild tricuspid regurgitation. Mild pulmonic regurgitation.  No prior study for compairison.  Echocardiogram 05/17/2020:  MitralValve: There is mild posterior annular calcification.  MitralValve: Mitral valve structure is normal. The leaflets are mildly thickened and exhibit normal excursion.  LeftVentricle: Systolic function is normal. EF: 57-60%.  LeftVentricle: Wall motion is normal.  Pericardium: There is a pericardial effusion noted. Moderate pericardial effusion noted anteriorly around RV at the most 14 mm. Layer of echodense structure noted attached to RV from pericardial side.   Etiology  is not clear but thrombus or mass or artifact are on differential. No RV or RA collapse was noted. No evidence of pericardial tamponade.   Compared to previous study from September 14 pericardial effusion has decreased and now located mainly anteriorly around the RV and no evidence of pericardial effusion around LV or RA or LA. Echodense structure which was noted in pericardial space on previous study also has decreased in size. It was limited study to reevaluate pericardial effusion.  PCV ECHOCARDIOGRAM LIMITED 07/29/2020 Left ventricle cavity is normal in size. Mild concentric hypertrophy of the left ventricle. Normal global wall motion. Normal LV systolic function with visual EF 50-55%. Doppler evidence of grade I (impaired) diastolic dysfunction, elevated LAP. Left atrial cavity is at least moderately dilated. Moderate (Grade III) mitral regurgitation. Mild to moderate tricuspid regurgitation. Estimated pulmonary artery systolic pressure 44 mmHg. Compared to previous study on 11/05/2019, pulmonary hypertension is new.  EKG   EKG 08/05/2020: Marked sinus bradycardia at the rate of 45 bpm, left axis deviation, incomplete right bundle branch block.  Poor R wave progression, cannot exclude anteroseptal infarct old.  Nonspecific T abnormality.  Normal QT interval.    EKG 05/25/2020:Sinus bradycardia at a rate of 57 bpm with first-degree AV block, left atrial enlargement.  Normal axis.  Incomplete right bundle branch block.  Poor R wave progression, cannot exclude anterior infarct old.  Nonspecific T wave abnormality.  Compared to EKG 10/15/2019, first-degree AV block and left atrial enlargement new.  EKG 10/01/2019: Atrial fibrillation with rapid ventricular response with rate of 111 bpm, normal axis, nonspecific ST depression in V1 and V2, cannot exclude septal ischemia.  Low-voltage complexes.  Abnormal EKG.  Assessment     ICD-10-CM   1. Paroxysmal atrial fibrillation (HCC)  I48.0 EKG 12-Lead   2. Chronic systolic (congestive) heart failure (HCC)  I50.22   3. Pericardial effusion  I31.3    No orders of the defined types were placed in this encounter.   There are no discontinued medications.   Recommendations:   WANNA GULLY  is a 74 y.o. Caucasian female with hypertension, hyperlipidemia, hyperglycemia seen in the emergency room on 10/01/2019 for atrial fibrillation.  She was admitted to the hospital on 03/30/2020 with Covid pneumonia, and again admitted on 05/10/2020 for A. fib with RVR.  She was recently seen in the emergency department at [facility] 05/16/2021 in A. fib with RVR and heart failure, and  underwent successful cardioversion on 05/18/2020. Etiology for atrial fibrillation includes uncontrolled hypertension, and obesity.    Patient presents for 39-monthfollow-up of atrial fibrillation, heart failure, and pericardial effusion.  In regard to pericardial effusion repeat echocardiogram showed no residual effusion.  There presently no clinical signs of heart failure, and she is doing well  EKG today revealed marked sinus bradycardia, suspect underlying bradycardia as etiology for elevated blood pressure on home readings.  Will reduce metoprolol succinate from 50 mg to 25 mg daily.  Patient will also hold amiodarone 100 mg daily for 1 week.  Patient will continue to monitor blood pressure on a daily basis, advised her to bring with her written log to her next appointment.  Reviewed and discussed with patient regarding results of echocardiogram.  Echocardiogram was relatively unchanged, but did reveal new pulmonary hypertension.  Suspect etiology of pulmonary hypertension to be multifactorial including obesity and recent COVID-19 infection.  We will continue to monitor.   Discussed the importance today of continued weight loss and encouraged her to continue focusing on healthy diet and active lifestyle.  Follow up in 3 weeks for bradycardia and hypertension with repeat EKG.   Patient  was seen in collaboration with Dr. GEinar Gip He also reviewed patient's chart and examined the patient. Dr. GEinar Gipis in agreement of the plan.    CAlethia Berthold PA-C 08/05/2020, 2:46 PM Office: [phone]

## 2020-08-25 ENCOUNTER — Ambulatory Visit: Payer: Medicare HMO | Admitting: Cardiology

## 2020-08-25 NOTE — Progress Notes (Signed)
Primary Physician/Referring:  [name], MD  Patient ID: Amanda Frazier, female    DOB: [birth year], 74 y.o.   MRN: [id number]  Chief Complaint  Patient presents with  . Atrial Fibrillation  . Hypertension  . Follow-up   HPI:    Amanda Frazier  is a 74 y.o. Caucasian female with hypertension, hyperlipidemia, hyperglycemia seen in the emergency room on 10/01/2019 for atrial fibrillation.  She was admitted to the hospital on 03/30/2020 with Covid pneumonia, and again admitted on 05/10/2020 for A. fib with RVR.  She was recently seen in the emergency department at [facility] 05/16/2021 in A. fib with RVR and heart failure, and underwent successful cardioversion on 05/18/2020.    Patient presents for 3 week follow up of bradycardia and hypertension. At last visit reduced metoprolol from 50 mg to 25 mg daily and advised patient to hold amiodarone for 1 week. Patient has now resumed taking amiodarone. She is feeling well. Denies chest pain, dizziness, dyspnea, fatigue, swelling. She brings with her a written log of blood pressures which remain elevated above goal. She also brings a log of home heart rate with remains in the 40s bpm consistently.   Past Medical History:  Diagnosis Date  . Atrial fibrillation ([place])   . Hyperlipidemia   . Hypertension    Past Surgical History:  Procedure Laterality Date  . CARDIOVERSION  2021  . REPLACEMENT TOTAL KNEE Left 2015   Social History   Tobacco Use  . Smoking status: Never Smoker  . Smokeless tobacco: Never Used  Substance Use Topics  . Alcohol use: Never  Marital Status: Married  ROS  Review of Systems  Constitutional: Negative for malaise/fatigue and weight gain.  Cardiovascular: Negative for chest pain, claudication, dyspnea on exertion, leg swelling, near-syncope, orthopnea, palpitations, paroxysmal nocturnal dyspnea and syncope.  Respiratory: Negative for shortness of breath.   Hematologic/Lymphatic: Does not bruise/bleed easily.   Gastrointestinal: Negative for melena.  Neurological: Negative for dizziness and weakness.   Objective  Blood pressure (!) 175/57, pulse (!) 48, height '5\' 4"'  (1.626 m), SpO2 100 %.  Vitals with BMI 08/26/2020 08/05/2020 08/05/2020  Height '5\' 4"'  - '5\' 4"'   Weight - - 179 lbs  BMI - - 40.98  Systolic 119 147 829  Diastolic 57 57 62  Pulse 48 45 73     Physical Exam Vitals reviewed.  HENT:     Head: Normocephalic and atraumatic.  Cardiovascular:     Rate and Rhythm: Regular rhythm. Bradycardia present.     Pulses: Intact distal pulses.          Carotid pulses are 2+ on the right side and 2+ on the left side.      Radial pulses are 2+ on the right side and 2+ on the left side.       Posterior tibial pulses are 2+ on the right side and 2+ on the left side.     Heart sounds: Normal heart sounds, S1 normal and S2 normal. No murmur heard. No gallop.      Comments: No leg edema, no JVD. Pulmonary:     Effort: Pulmonary effort is normal. No respiratory distress.     Breath sounds: Normal breath sounds. No wheezing, rhonchi or rales.  Abdominal:     General: Bowel sounds are normal.     Palpations: Abdomen is soft.  Musculoskeletal:     Right lower leg: No edema.     Left lower leg: No edema.  Neurological:  Mental Status: She is alert.    Laboratory examination:   CMP Latest Ref Rng & Units 12/09/2019 11/21/2019 08/07/2014  Glucose 65 - 99 mg/dL 105(H) 100(H) 136(H)  BUN 8 - 27 mg/dL '22 25 16  ' Creatinine 0.57 - 1.00 mg/dL 1.23(H) 1.38(H) 1.17  Sodium 134 - 144 mmol/L 138 136 137  Potassium 3.5 - 5.2 mmol/L 4.6 4.7 3.4(L)  Chloride 96 - 106 mmol/L 101 100 101  CO2 20 - 29 mmol/L '22 20 29  ' Calcium 8.7 - 10.3 mg/dL 10.1 9.9 8.3(L)   CBC Latest Ref Rng & Units 08/07/2014 08/06/2014 07/22/2014  WBC 3.6 - 11.0 x10 3/mm 3 - - 10.5  Hemoglobin 12.0 - 16.0 g/dL 10.4(L) 10.7(L) 12.1  Hematocrit 35.0 - 47.0 % - - 37.1  Platelets 150 - 440 x10 3/mm 3 274 274 342   Lipid Panel  No  results found for: CHOL, TRIG, HDL, CHOLHDL, VLDL, LDLCALC, LDLDIRECT HEMOGLOBIN A1C No results found for: HGBA1C, MPG TSH Recent Labs    10/29/19 1128  TSH 2.060   External labs:  06/10/2020: HDL 43, LDL 90, total cholesterol 133, triglycerides 105 A1c 5.2% BUN 9.0, creatinine 1.23, EGFR 43 TSH 4.79  12/09/2019: BUN 22, SrCr 1.23  10/29/2019: TSH 2.06  Labs 10/01/2019: Hb 12.7/HCT 40.0, platelets 324.  Serum glucose 116 mg, BUN 18, creatinine 1.09, EGFR 50 mL.  Calcium minimally elevated at 10.4, CMP otherwise normal.    05/20/2019: HDL 53, LDL 103, triglycerides 121, total 180  Medications and allergies   Allergies  Allergen Reactions  . Atorvastatin Other (See Comments)    Fatigue  . Celecoxib Nausea And Vomiting  . Other Nausea And Vomiting, Other (See Comments) and Diarrhea  . Fosamax [Alendronate Sodium] Nausea And Vomiting  . Lipitor [Atorvastatin Calcium]     Fatigue  . Valsartan-Hydrochlorothiazide     Other reaction(s): cough  . Benazepril     Other reaction(s): Cough  . Nickel     Other reaction(s): Other (See Comments) Positive patch test  . Palladium Chloride     Other reaction(s): Other (See Comments) Positive patch test  . Simvastatin Diarrhea  . Valsartan     Other reaction(s): Cough    Current Outpatient Medications  Medication Instructions  . amiodarone (PACERONE) 100 mg, Oral, Daily  . amLODipine (NORVASC) 5 mg, Oral, Daily  . calcium carbonate (TUMS - DOSED IN MG ELEMENTAL CALCIUM) 500 MG chewable tablet 1 tablet, Oral, 2 times daily  . Cholecalciferol 1.25 MG (50000 UT) TABS 1 tablet, Oral, Daily  . ELIQUIS 5 MG TABS tablet TAKE 1 TABLET BY MOUTH TWICE A DAY  . ferrous sulfate 325 mg, Oral, Daily with breakfast  . furosemide (LASIX) 20 mg, Oral, As needed  . hydrochlorothiazide (MICROZIDE) 12.5 mg, Oral, Daily  . omeprazole (PRILOSEC) 20 MG capsule 1 capsule, Oral, Daily  . rosuvastatin (CRESTOR) 10 mg, Oral, Daily   Radiology:    Chest x-ray PA and lateral view 09/30/2019: There is minimal scarring versus atelectasis on the lateral left lung base. No focal infiltrate is seen. There is no pleural effusion or pneumothorax. Cardiac silhouette is borderline enlarged. Mediastinal contours are normal. There are mild degenerative changes at the shoulders and throughout the spine. Impression:  Borderline cardiomegaly. No visualized acute abnormality.   Chest x-ray AP 05/16/2020:  Stable left greater than right effusions and bibasilar atelectasis. Cannot exclude developing infection.    Cardiac Studies:  Lexiscan (Walking with mod Bruce)Tetrofosmin Stress Test  10/27/2019:  Nondiagnostic ECG stress.  Inferior and inferolateral breast tissue attenuation noted. Myocardial perfusion is normal. Overall LV systolic function is normal without regional wall motion abnormalities. Stress LV EF: 55%.  No previous exam available for comparison. Low risk study.   Echocardiogram 11/05/2019:  Normal LV systolic function with visual EF 50-55%. No obvious regional  wall motion abnormalities. Normal wall thickness. Left ventricle cavity is  normal in size. Normal global wall motion. Left atrial cavity is mildly dilated .  Mild (Grade I) mitral regurgitation. Mild tricuspid regurgitation. Mild pulmonic regurgitation.  No prior study for compairison.  Echocardiogram 05/17/2020:  MitralValve: There is mild posterior annular calcification.  MitralValve: Mitral valve structure is normal. The leaflets are mildly thickened and exhibit normal excursion.  LeftVentricle: Systolic function is normal. EF: 57-60%.  LeftVentricle: Wall motion is normal.  Pericardium: There is a pericardial effusion noted. Moderate pericardial effusion noted anteriorly around RV at the most 14 mm. Layer of echodense structure noted attached to RV from pericardial side.   Etiology is not clear but thrombus or mass or artifact are on differential. No RV or RA  collapse was noted. No evidence of pericardial tamponade.   Compared to previous study from September 14 pericardial effusion has decreased and now located mainly anteriorly around the RV and no evidence of pericardial effusion around LV or RA or LA. Echodense structure which was noted in pericardial space on previous study also has decreased in size. It was limited study to reevaluate pericardial effusion.  PCV ECHOCARDIOGRAM LIMITED 07/29/2020 Left ventricle cavity is normal in size. Mild concentric hypertrophy of the left ventricle. Normal global wall motion. Normal LV systolic function with visual EF 50-55%. Doppler evidence of grade I (impaired) diastolic dysfunction, elevated LAP. Left atrial cavity is at least moderately dilated. Moderate (Grade III) mitral regurgitation. Mild to moderate tricuspid regurgitation. Estimated pulmonary artery systolic pressure 44 mmHg. Compared to previous study on 11/05/2019, pulmonary hypertension is new.  EKG   EKG 08/26/2020: Marked sinus bradycardia with first-degree AV block at a rate of 49 bpm.  Left axis.  Incomplete right bundle branch block.  PRWP, cannot exclude anteroseptal infarct old.  Nonspecific T wave abnormality. Borderline QT interval.   EKG 08/05/2020: Marked sinus bradycardia at the rate of 45 bpm, left axis deviation, incomplete right bundle branch block.  Poor R wave progression, cannot exclude anteroseptal infarct old.  Nonspecific T abnormality.  Normal QT interval.    EKG 05/25/2020:Sinus bradycardia at a rate of 57 bpm with first-degree AV block, left atrial enlargement.  Normal axis.  Incomplete right bundle branch block.  Poor R wave progression, cannot exclude anterior infarct old.  Nonspecific T wave abnormality.  Compared to EKG 10/15/2019, first-degree AV block and left atrial enlargement new.  EKG 10/01/2019: Atrial fibrillation with rapid ventricular response with rate of 111 bpm, normal axis, nonspecific ST depression in V1  and V2, cannot exclude septal ischemia.  Low-voltage complexes.  Abnormal EKG.  Assessment     ICD-10-CM   1. Primary hypertension  I10 EKG 12-Lead    hydrochlorothiazide (MICROZIDE) 12.5 MG capsule    Basic metabolic panel    DISCONTINUED: metoprolol tartrate (LOPRESSOR) 25 MG tablet  2. Sinus bradycardia by electrocardiogram  R00.1 EKG 12-Lead  3. Paroxysmal atrial fibrillation (HCC)  I48.0 EKG 12-Lead  4. Stage 3a chronic kidney disease (HCC)  N18.31   5. Paroxysmal atrial fibrillation ([place]). CHA2DS2-VASc Score is 3.  Yearly risk of stroke: 3.2% (A, F, HTN)  I48.0 amiodarone (PACERONE) 200 MG tablet  DISCONTINUED: metoprolol tartrate (LOPRESSOR) 25 MG tablet   Meds ordered this encounter  Medications  . hydrochlorothiazide (MICROZIDE) 12.5 MG capsule    Sig: Take 1 capsule (12.5 mg total) by mouth daily.    Dispense:  30 capsule    Refill:  0  . DISCONTD: metoprolol tartrate (LOPRESSOR) 25 MG tablet    Sig: Take 0.5 tablets (12.5 mg total) by mouth 2 (two) times daily.    Dispense:  90 tablet    Refill:  3  . amiodarone (PACERONE) 200 MG tablet    Sig: Take 0.5 tablets (100 mg total) by mouth daily.    Dispense:  90 tablet    Refill:  3  . amLODipine (NORVASC) 5 MG tablet    Sig: Take 1 tablet (5 mg total) by mouth daily.    Dispense:  30 tablet    Refill:  3    Medications Discontinued During This Encounter  Medication Reason  . Dextromethorphan-guaiFENesin ([place] DM) 30-600 MG TB12 Completed Course  . metoprolol tartrate (LOPRESSOR) 25 MG tablet   . metoprolol tartrate (LOPRESSOR) 25 MG tablet Discontinued by provider  . amiodarone (PACERONE) 200 MG tablet      Recommendations:   Amanda Frazier  is a 74 y.o. Caucasian female with hypertension, hyperlipidemia, hyperglycemia seen in the emergency room on 10/01/2019 for atrial fibrillation.  She was admitted to the hospital on 03/30/2020 with Covid pneumonia, and again admitted on 05/10/2020 for A. fib with RVR.  She  was recently seen in the emergency department at [facility] 05/16/2021 in A. fib with RVR and heart failure, and underwent successful cardioversion on 05/18/2020. Etiology for atrial fibrillation includes uncontrolled hypertension, and obesity.    Patient presents for 3-week follow-up of bradycardia and hypertension.  She continues to feel well, and remains asymptomatic.  EKG today revealed continued marked bradycardia despite reduction of metoprolol.  Also in the office today blood pressure remains significantly elevated above goal.  Recommend stopping metoprolol and reducing amiodarone to 100 mg once daily.  In order to improve blood pressure control will add hydrochlorothiazide 12.5 mg and amlodipine 5 mg daily.  Will obtain repeat BMP in 1 week.  She will continue to monitor and keep a written log of her blood pressure and heart rate daily. Of note patient has lost weight since first occurrence of atrial fibrillation, suspect this has improved her risk atrial fibrillation recurrence.   As patient is presently being treated with amiodarone, they will need annual monitoring of PFTs, thyroid function, liver function, and ophthalmologic exam. Patient is aware of these monitoring parameters and agrees.   Follow-up in 4 weeks for bradycardia and hypertension.   Patient was seen in collaboration with Dr. Virgina Jock. He also reviewed patient's chart and Dr. Virgina Jock is in agreement of the plan.    Alethia Berthold, PA-C 08/26/2020, 12:21 PM Office: [phone]

## 2020-08-26 ENCOUNTER — Ambulatory Visit: Payer: Medicare HMO | Admitting: Student

## 2020-08-26 ENCOUNTER — Telehealth: Payer: Self-pay | Admitting: Student

## 2020-08-26 ENCOUNTER — Encounter: Payer: Self-pay | Admitting: Student

## 2020-08-26 ENCOUNTER — Other Ambulatory Visit: Payer: Self-pay

## 2020-08-26 VITALS — BP 175/57 | HR 48 | Ht 64.0 in

## 2020-08-26 DIAGNOSIS — N1831 Chronic kidney disease, stage 3a: Secondary | ICD-10-CM

## 2020-08-26 DIAGNOSIS — I480 Paroxysmal atrial fibrillation: Secondary | ICD-10-CM

## 2020-08-26 DIAGNOSIS — R001 Bradycardia, unspecified: Secondary | ICD-10-CM

## 2020-08-26 DIAGNOSIS — I10 Essential (primary) hypertension: Secondary | ICD-10-CM

## 2020-08-26 MED ORDER — METOPROLOL TARTRATE 25 MG PO TABS: 12.5000 mg | ORAL_TABLET | Freq: Two times a day (BID) | ORAL | 3 refills | Status: DC

## 2020-08-26 MED ORDER — HYDROCHLOROTHIAZIDE 12.5 MG PO CAPS: 12.5000 mg | ORAL_CAPSULE | Freq: Every day | ORAL | 0 refills | Status: DC

## 2020-08-26 MED ORDER — AMIODARONE HCL 200 MG PO TABS: 100.0000 mg | ORAL_TABLET | Freq: Every day | ORAL | 3 refills | Status: DC

## 2020-08-26 MED ORDER — AMLODIPINE BESYLATE 5 MG PO TABS: 5.0000 mg | ORAL_TABLET | Freq: Every day | ORAL | 3 refills | Status: DC

## 2020-08-26 NOTE — Telephone Encounter (Signed)
Error

## 2020-08-26 NOTE — Patient Instructions (Signed)
Labs 1 week

## 2020-09-02 ENCOUNTER — Other Ambulatory Visit: Payer: Self-pay | Admitting: Student

## 2020-09-03 LAB — BASIC METABOLIC PANEL
BUN/Creatinine Ratio: 11 — ABNORMAL LOW (ref 12–28)
BUN: 16 mg/dL (ref 8–27)
CO2: 25 mmol/L (ref 20–29)
Calcium: 9.8 mg/dL (ref 8.7–10.3)
Chloride: 103 mmol/L (ref 96–106)
Creatinine, Ser: 1.4 mg/dL — ABNORMAL HIGH (ref 0.57–1.00)
GFR calc Af Amer: 42 mL/min/{1.73_m2} — ABNORMAL LOW (ref >59)
GFR calc non Af Amer: 37 mL/min/{1.73_m2} — ABNORMAL LOW (ref >59)
Glucose: 97 mg/dL (ref 65–99)
Potassium: 4.5 mmol/L (ref 3.5–5.2)
Sodium: 142 mmol/L (ref 134–144)

## 2020-09-03 NOTE — Progress Notes (Signed)
Called and spoke with patient regarding her lab results. Patient aware to bring in BP log to next OV.

## 2020-09-03 NOTE — Progress Notes (Signed)
Please inform patient that her kidney function has trended down.  Advised her to stop hydrochlorothiazide continue to monitor her blood pressure on a regular basis.  Instructed patient to bring blood pressure log with her to her next visit at which time we will reevaluate hypertension control and medication assessments.

## 2020-09-06 ENCOUNTER — Other Ambulatory Visit: Payer: Self-pay | Admitting: Student

## 2020-09-06 DIAGNOSIS — I10 Essential (primary) hypertension: Secondary | ICD-10-CM

## 2020-09-06 DIAGNOSIS — I5022 Chronic systolic (congestive) heart failure: Secondary | ICD-10-CM

## 2020-09-09 ENCOUNTER — Ambulatory Visit: Payer: Medicare HMO | Admitting: Student

## 2020-09-13 NOTE — Progress Notes (Signed)
Primary Physician/Referring:  [name], MD  Patient ID: Amanda Frazier, female    DOB: 10/20/1945, 75 y.o.   MRN: [id number]  Chief Complaint  Patient presents with   Hypertension   Bradycardia   Follow-up   HPI:    Amanda Frazier  is a 75 y.o. Caucasian female with hypertension, hyperlipidemia, hyperglycemia seen in the emergency room on 10/01/2019 for atrial fibrillation.  She was admitted to the hospital on 03/30/2020 with Covid pneumonia, and again admitted on 05/10/2020 for A. fib with RVR.  She was recently seen in the emergency department at [facility] 05/16/2021 in A. fib with RVR and heart failure, and underwent successful cardioversion on 05/18/2020.    Patient presents for 4 week follow up for bradycardia and hypertension. At last visit stopped metoprolol and reduced amiodarone to 100 mg once daily and started HCTZ 12.5 mg and amlodipine 5 mg daily. In view of decreased renal function on repeat BMP, stopped HCTZ. She brings with her home blood pressure log, which reveals blood pressure under excellent control, despite stopping HCTZ. Patient is feeling well, reports improved energy level and increased activity. Denies chest pain, dizziness, dyspnea, fatigue, swelling. She has not taken Lasix recently. Tolerating anticoagulation without bleeding diathesis. Home heart rate log shows resting heart rate 55-65 bpm since reducing amiodarone.   Past Medical History:  Diagnosis Date   Atrial fibrillation ([place])    Hyperlipidemia    Hypertension    Past Surgical History:  Procedure Laterality Date   CARDIOVERSION  2021   REPLACEMENT TOTAL KNEE Left 2015   Social History   Tobacco Use   Smoking status: Never Smoker   Smokeless tobacco: Never Used  Substance Use Topics   Alcohol use: Never  Marital Status: Married  ROS  Review of Systems  Constitutional: Negative for malaise/fatigue and weight gain.  Cardiovascular: Negative for chest pain, claudication, dyspnea  on exertion, leg swelling, near-syncope, orthopnea, palpitations, paroxysmal nocturnal dyspnea and syncope.  Respiratory: Negative for shortness of breath.   Hematologic/Lymphatic: Does not bruise/bleed easily.  Gastrointestinal: Negative for melena.  Neurological: Negative for dizziness and weakness.   Objective  Blood pressure (!) 156/56, pulse 63, temperature 98.3 F (36.8 C), temperature source Temporal, resp. rate 16, height '5\' 4"'  (1.626 m), weight 176 lb (79.8 kg), SpO2 99 %.  Vitals with BMI 09/15/2020 08/26/2020 08/05/2020  Height '5\' 4"'  '5\' 4"'  -  Weight 176 lbs - -  BMI 92.4 - -  Systolic 462 863 817  Diastolic 56 57 57  Pulse 63 48 45     Physical Exam Vitals reviewed.  HENT:     Head: Normocephalic and atraumatic.  Cardiovascular:     Rate and Rhythm: Normal rate and regular rhythm.     Pulses: Intact distal pulses.          Carotid pulses are 2+ on the right side and 2+ on the left side.      Radial pulses are 2+ on the right side and 2+ on the left side.       Posterior tibial pulses are 2+ on the right side and 2+ on the left side.     Heart sounds: S1 normal and S2 normal. Murmur heard.  High-pitched blowing holosystolic murmur is present with a grade of 2/6 at the apex. No gallop.      Comments: No JVD. Trace bilateral lower leg edema.  Pulmonary:     Effort: Pulmonary effort is normal. No respiratory distress.  Breath sounds: Normal breath sounds. No wheezing, rhonchi or rales.  Abdominal:     General: Bowel sounds are normal.     Palpations: Abdomen is soft.  Musculoskeletal:     Right lower leg: No edema.     Left lower leg: No edema.  Neurological:     Mental Status: She is alert.    Laboratory examination:   CMP Latest Ref Rng & Units 09/02/2020 12/09/2019 11/21/2019  Glucose 65 - 99 mg/dL 97 105(H) 100(H)  BUN 8 - 27 mg/dL '16 22 25  ' Creatinine 0.57 - 1.00 mg/dL 1.40(H) 1.23(H) 1.38(H)  Sodium 134 - 144 mmol/L 142 138 136  Potassium 3.5 - 5.2 mmol/L  4.5 4.6 4.7  Chloride 96 - 106 mmol/L 103 101 100  CO2 20 - 29 mmol/L '25 22 20  ' Calcium 8.7 - 10.3 mg/dL 9.8 10.1 9.9   CBC Latest Ref Rng & Units 08/07/2014 08/06/2014 07/22/2014  WBC 3.6 - 11.0 x10 3/mm 3 - - 10.5  Hemoglobin 12.0 - 16.0 g/dL 10.4(L) 10.7(L) 12.1  Hematocrit 35.0 - 47.0 % - - 37.1  Platelets 150 - 440 x10 3/mm 3 274 274 342   Lipid Panel  No results found for: CHOL, TRIG, HDL, CHOLHDL, VLDL, LDLCALC, LDLDIRECT HEMOGLOBIN A1C No results found for: HGBA1C, MPG TSH Recent Labs    10/29/19 1128  TSH 2.060   External labs:  06/10/2020: HDL 43, LDL 90, total cholesterol 133, triglycerides 105 A1c 5.2% BUN 9.0, creatinine 1.23, EGFR 43 TSH 4.79  12/09/2019: BUN 22, SrCr 1.23  10/29/2019: TSH 2.06  Labs 10/01/2019: Hb 12.7/HCT 40.0, platelets 324.  Serum glucose 116 mg, BUN 18, creatinine 1.09, EGFR 50 mL.  Calcium minimally elevated at 10.4, CMP otherwise normal.    05/20/2019: HDL 53, LDL 103, triglycerides 121, total 180  Medications and allergies   Allergies  Allergen Reactions   Atorvastatin Other (See Comments)    Fatigue   Celecoxib Nausea And Vomiting   Other Nausea And Vomiting, Other (See Comments) and Diarrhea   Fosamax [Alendronate Sodium] Nausea And Vomiting   Lipitor [Atorvastatin Calcium]     Fatigue   Valsartan-Hydrochlorothiazide     Other reaction(s): cough   Benazepril     Other reaction(s): Cough   Nickel     Other reaction(s): Other (See Comments) Positive patch test   Palladium Chloride     Other reaction(s): Other (See Comments) Positive patch test   Simvastatin Diarrhea   Valsartan     Other reaction(s): Cough    Current Outpatient Medications  Medication Instructions   amiodarone (PACERONE) 100 mg, Oral, Daily   amLODipine (NORVASC) 5 mg, Oral, Daily   calcium carbonate (TUMS - DOSED IN MG ELEMENTAL CALCIUM) 500 MG chewable tablet 1 tablet, Oral, 2 times daily   Cholecalciferol 1.25 MG (50000 UT) TABS  1 tablet, Oral, Daily   ELIQUIS 5 MG TABS tablet TAKE 1 TABLET BY MOUTH TWICE A DAY   ferrous sulfate 325 mg, Oral, Daily with breakfast   furosemide (LASIX) 20 mg, Oral, As needed   psyllium (METAMUCIL) 0.52 g capsule 2 capsules with 8 ounces of liquid   rosuvastatin (CRESTOR) 10 mg, Oral, Daily   Radiology:   Chest x-ray PA and lateral view 09/30/2019: There is minimal scarring versus atelectasis on the lateral left lung base. No focal infiltrate is seen. There is no pleural effusion or pneumothorax. Cardiac silhouette is borderline enlarged. Mediastinal contours are normal. There are mild degenerative changes at the shoulders and  throughout the spine. Impression:  Borderline cardiomegaly. No visualized acute abnormality.   Chest x-ray AP 05/16/2020:  Stable left greater than right effusions and bibasilar atelectasis. Cannot exclude developing infection.    Cardiac Studies:  Lexiscan (Walking with mod Bruce)Tetrofosmin Stress Test  10/27/2019:  Nondiagnostic ECG stress. Inferior and inferolateral breast tissue attenuation noted. Myocardial perfusion is normal. Overall LV systolic function is normal without regional wall motion abnormalities. Stress LV EF: 55%.  No previous exam available for comparison. Low risk study.   Echocardiogram 11/05/2019:  Normal LV systolic function with visual EF 50-55%. No obvious regional  wall motion abnormalities. Normal wall thickness. Left ventricle cavity is  normal in size. Normal global wall motion. Left atrial cavity is mildly dilated .  Mild (Grade I) mitral regurgitation. Mild tricuspid regurgitation. Mild pulmonic regurgitation.  No prior study for compairison.  Echocardiogram 05/17/2020:  MitralValve: There is mild posterior annular calcification.  MitralValve: Mitral valve structure is normal. The leaflets are mildly thickened and exhibit normal excursion.  LeftVentricle: Systolic function is normal. EF: 57-60%.   LeftVentricle: Wall motion is normal.  Pericardium: There is a pericardial effusion noted. Moderate pericardial effusion noted anteriorly around RV at the most 14 mm. Layer of echodense structure noted attached to RV from pericardial side.   Etiology is not clear but thrombus or mass or artifact are on differential. No RV or RA collapse was noted. No evidence of pericardial tamponade.   Compared to previous study from September 14 pericardial effusion has decreased and now located mainly anteriorly around the RV and no evidence of pericardial effusion around LV or RA or LA. Echodense structure which was noted in pericardial space on previous study also has decreased in size. It was limited study to reevaluate pericardial effusion.  PCV ECHOCARDIOGRAM LIMITED 07/29/2020 Left ventricle cavity is normal in size. Mild concentric hypertrophy of the left ventricle. Normal global wall motion. Normal LV systolic function with visual EF 50-55%. Doppler evidence of grade I (impaired) diastolic dysfunction, elevated LAP. Left atrial cavity is at least moderately dilated. Moderate (Grade III) mitral regurgitation. Mild to moderate tricuspid regurgitation. Estimated pulmonary artery systolic pressure 44 mmHg. Compared to previous study on 11/05/2019, pulmonary hypertension is new.  EKG   EKG 08/26/2020: Marked sinus bradycardia with first-degree AV block at a rate of 49 bpm.  Left axis.  Incomplete right bundle branch block.  PRWP, cannot exclude anteroseptal infarct old.  Nonspecific T wave abnormality. Borderline QT interval.   EKG 08/05/2020: Marked sinus bradycardia at the rate of 45 bpm, left axis deviation, incomplete right bundle branch block.  Poor R wave progression, cannot exclude anteroseptal infarct old.  Nonspecific T abnormality.  Normal QT interval.    EKG 05/25/2020:Sinus bradycardia at a rate of 57 bpm with first-degree AV block, left atrial enlargement.  Normal axis.  Incomplete right  bundle branch block.  Poor R wave progression, cannot exclude anterior infarct old.  Nonspecific T wave abnormality.  Compared to EKG 10/15/2019, first-degree AV block and left atrial enlargement new.  EKG 10/01/2019: Atrial fibrillation with rapid ventricular response with rate of 111 bpm, normal axis, nonspecific ST depression in V1 and V2, cannot exclude septal ischemia.  Low-voltage complexes.  Abnormal EKG.  Assessment     ICD-10-CM   1. Primary hypertension  I10   2. Bradycardia  R00.1   3. Paroxysmal atrial fibrillation ([place]). CHA2DS2-VASc Score is 3.  Yearly risk of stroke: 3.2% (A, F, HTN)  I48.0 amiodarone (PACERONE) 200 MG tablet  Meds ordered this encounter  Medications   amiodarone (PACERONE) 200 MG tablet    Sig: Take 0.5 tablets (100 mg total) by mouth daily.    Dispense:  90 tablet    Refill:  3    Medications Discontinued During This Encounter  Medication Reason   hydrochlorothiazide (MICROZIDE) 12.5 MG capsule Discontinued by provider   omeprazole (PRILOSEC) 20 MG capsule Discontinued by provider   amiodarone (PACERONE) 200 MG tablet Reorder     Recommendations:   KHALI PERELLA  is a 75 y.o. Caucasian female with hypertension, hyperlipidemia, hyperglycemia seen in the emergency room on 10/01/2019 for atrial fibrillation.  She was admitted to the hospital on 03/30/2020 with Covid pneumonia, and again admitted on 05/10/2020 for A. fib with RVR.  She was recently seen in the emergency department at [facility] 05/16/2021 in A. fib with RVR and heart failure, and underwent successful cardioversion on 05/18/2020. Etiology for atrial fibrillation includes uncontrolled hypertension, and obesity.    Patient presents for 4-week follow-up of bradycardia and hypertension.  She is presently feeling well without known recurrence of atrial fibrillation.  Blood pressure is elevated in the office today, however patient brings with her home blood pressure log which is under excellent  control.  We will continue amlodipine 5 mg.  Heart rate is also significantly improved since reducing amiodarone to 100 mg once daily, will continue this.  Patient is presently being treated with amiodarone, most recent ophthalmologic exam was last month, and TSH and liver function labs were obtained 05/2020.  Patient will continue to need annual monitoring of PFTs, thyroid function, liver function, and ophthalmologic exam as she is being treated with amiodarone.  Encourage patient to continue to focus on diet and lifestyle modifications as well as weight loss.  Encouraged her to slowly increase physical activity as tolerated.  Follow-up in 3 months for atrial fibrillation, hypertension.   Alethia Berthold, PA-C 09/15/2020, 9:39 AM Office: [phone]

## 2020-09-15 ENCOUNTER — Other Ambulatory Visit: Payer: Self-pay

## 2020-09-15 ENCOUNTER — Ambulatory Visit: Payer: Medicare HMO | Admitting: Student

## 2020-09-15 ENCOUNTER — Encounter: Payer: Self-pay | Admitting: Student

## 2020-09-15 VITALS — BP 156/56 | HR 63 | Temp 98.3°F | Resp 16 | Ht 64.0 in | Wt 176.0 lb

## 2020-09-15 DIAGNOSIS — I10 Essential (primary) hypertension: Secondary | ICD-10-CM

## 2020-09-15 DIAGNOSIS — R001 Bradycardia, unspecified: Secondary | ICD-10-CM

## 2020-09-15 DIAGNOSIS — I480 Paroxysmal atrial fibrillation: Secondary | ICD-10-CM

## 2020-09-15 MED ORDER — AMIODARONE HCL 200 MG PO TABS: 100.0000 mg | ORAL_TABLET | Freq: Every day | ORAL | 3 refills | Status: DC

## 2020-09-17 ENCOUNTER — Other Ambulatory Visit: Payer: Self-pay | Admitting: Student

## 2020-09-17 DIAGNOSIS — I10 Essential (primary) hypertension: Secondary | ICD-10-CM

## 2020-11-18 ENCOUNTER — Other Ambulatory Visit: Payer: Self-pay | Admitting: Student

## 2020-12-15 ENCOUNTER — Other Ambulatory Visit: Payer: Self-pay | Admitting: Cardiology

## 2020-12-15 ENCOUNTER — Ambulatory Visit: Payer: Medicare HMO | Admitting: Student

## 2020-12-15 DIAGNOSIS — I480 Paroxysmal atrial fibrillation: Secondary | ICD-10-CM

## 2021-01-07 DIAGNOSIS — H906 Mixed conductive and sensorineural hearing loss, bilateral: Secondary | ICD-10-CM | POA: Insufficient documentation

## 2021-01-12 NOTE — Progress Notes (Signed)
Primary Physician/Referring:  [name], MD  Patient ID: Amanda Frazier, female    DOB: [birth year], 75 y.o.   MRN: [id number]  Chief Complaint  Patient presents with  . Hypertension  . Atrial Fibrillation  . Follow-up    3 month   HPI:    Amanda Frazier  is a 75 y.o. Caucasian female with hypertension, hyperlipidemia, hyperglycemia seen in the emergency room on 10/01/2019 for atrial fibrillation.  She was admitted to the hospital on 03/30/2020 with Covid pneumonia, and again admitted on 05/10/2020 for A. fib with RVR.  She was recently seen in the emergency department at [facility] 05/16/2021 in A. fib with RVR and heart failure, and underwent successful cardioversion on 05/18/2020.    Patient presents for 3 month follow up of atrial fibrillation and hypertension. At last visit no changes were made.  Is feeling the best she has in quite a long time.  She has made significant diet modifications, reducing calorie intake, reduced salt intake, avoiding caffeine.  She is also open slowly increasing her physical activity without issue.  She does state mild fatigue after exercise.  Reports no known recurrence of atrial fibrillation.  She has not needed Lasix since last visit.  States home pulse averages 55-65 bpm.  Reports home blood pressure readings averaging high 937J systolic and 69C diastolic.  He is tolerating anticoagulation without bleeding diathesis.  Presently taking amiodarone 100 mg once daily.  Notably hydrochlorothiazide was stopped previously due to renal dysfunction.  Patient denies chest pain, palpitations, dyspnea, dizziness, syncope, near syncope.  Denies leg swelling, orthopnea, PND.  Past Medical History:  Diagnosis Date  . Atrial fibrillation ([place])   . Hyperlipidemia   . Hypertension    Family History  Problem Relation Age of Onset  . Breast cancer Maternal Aunt   . Atrial fibrillation Mother   . Hyperlipidemia Mother   . Lung cancer Mother   . Atrial fibrillation  Father   . Colon cancer Father   . Breast cancer Sister   . Leukemia Brother    Past Surgical History:  Procedure Laterality Date  . CARDIOVERSION  2021  . REPLACEMENT TOTAL KNEE Left 2015   Social History   Tobacco Use  . Smoking status: Never Smoker  . Smokeless tobacco: Never Used  Substance Use Topics  . Alcohol use: Never  Marital Status: Married  ROS  Review of Systems  Constitutional: Negative for malaise/fatigue and weight gain.  Cardiovascular: Negative for chest pain, claudication, dyspnea on exertion, leg swelling, near-syncope, orthopnea, palpitations, paroxysmal nocturnal dyspnea and syncope.  Respiratory: Negative for shortness of breath.   Hematologic/Lymphatic: Does not bruise/bleed easily.  Gastrointestinal: Negative for melena.  Neurological: Negative for dizziness and weakness.   Objective  Blood pressure (!) 144/62, pulse 62, temperature 98.1 F (36.7 C), height _0  (1.626 m), weight 179 lb (81.2 kg), SpO2 98 %.  Vitals with BMI 01/13/2021 09/15/2020 08/26/2020  Height _1  _2  _3   Weight 179 lbs 176 lbs -  BMI 78.93 81.0 -  Systolic 175 102 585  Diastolic 62 56 57  Pulse 62 63 48     Physical Exam Vitals reviewed.  HENT:     Head: Normocephalic and atraumatic.  Cardiovascular:     Rate and Rhythm: Normal rate and regular rhythm.     Pulses: Intact distal pulses.          Carotid pulses are 2+ on the right side and 2+ on the left side.  Radial pulses are 2+ on the right side and 2+ on the left side.       Posterior tibial pulses are 2+ on the right side and 2+ on the left side.     Heart sounds: S1 normal and S2 normal. Murmur heard.  High-pitched blowing holosystolic murmur is present with a grade of 2/6 at the apex. No gallop.      Comments: No JVD. Trace bilateral lower leg edema.  Pulmonary:     Effort: Pulmonary effort is normal. No respiratory distress.     Breath sounds: Normal breath sounds. No wheezing, rhonchi or rales.   Abdominal:     General: Bowel sounds are normal.     Palpations: Abdomen is soft.  Musculoskeletal:     Right lower leg: No edema.     Left lower leg: No edema.  Neurological:     Mental Status: She is alert.    Laboratory examination:   CMP Latest Ref Rng & Units 09/02/2020 12/09/2019 11/21/2019  Glucose 65 - 99 mg/dL 97 105(H) 100(H)  BUN 8 - 27 mg/dL _0 Creatinine 0.57 - 1.00 mg/dL 1.40(H) 1.23(H) 1.38(H)  Sodium 134 - 144 mmol/L 142 138 136  Potassium 3.5 - 5.2 mmol/L 4.5 4.6 4.7  Chloride 96 - 106 mmol/L 103 101 100  CO2 20 - 29 mmol/L _1 Calcium 8.7 - 10.3 mg/dL 9.8 10.1 9.9   CBC Latest Ref Rng & Units 08/07/2014 08/06/2014 07/22/2014  WBC 3.6 - 11.0 x10 3/mm 3 - - 10.5  Hemoglobin 12.0 - 16.0 g/dL 10.4(L) 10.7(L) 12.1  Hematocrit 35.0 - 47.0 % - - 37.1  Platelets 150 - 440 x10 3/mm 3 274 274 342   Lipid Panel  No results found for: CHOL, TRIG, HDL, CHOLHDL, VLDL, LDLCALC, LDLDIRECT HEMOGLOBIN A1C No results found for: HGBA1C, MPG TSH No results for input(s): TSH in the last 8760 hours. External labs:   06/10/2020: HDL 43, LDL 90, total cholesterol 133, triglycerides 105 A1c 5.2% BUN 9.0, creatinine 1.23, EGFR 43 TSH 4.79  12/09/2019: BUN 22, SrCr 1.23  10/29/2019: TSH 2.06  Labs 10/01/2019: Hb 12.7/HCT 40.0, platelets 324.  Serum glucose 116 mg, BUN 18, creatinine 1.09, EGFR 50 mL.  Calcium minimally elevated at 10.4, CMP otherwise normal.    05/20/2019: HDL 53, LDL 103, triglycerides 121, total 180  Medications and allergies   Allergies  Allergen Reactions  . Atorvastatin Other (See Comments)    Fatigue  . Celecoxib Nausea And Vomiting  . Other Nausea And Vomiting, Other (See Comments) and Diarrhea  . Fosamax [Alendronate Sodium] Nausea And Vomiting  . Lipitor [Atorvastatin Calcium]     Fatigue  . Valsartan-Hydrochlorothiazide     Other reaction(s): cough  . Benazepril     Other reaction(s): Cough  . Nickel     Other reaction(s):  Other (See Comments) Positive patch test  . Palladium Chloride     Other reaction(s): Other (See Comments) Positive patch test  . Simvastatin Diarrhea  . Valsartan     Other reaction(s): Cough    Current Outpatient Medications  Medication Instructions  . amiodarone (PACERONE) 100 mg, Oral, Daily  . amLODipine (NORVASC) 10 mg, Oral, Daily  . calcium carbonate (TUMS - DOSED IN MG ELEMENTAL CALCIUM) 500 MG chewable tablet 1 tablet, Oral, 2 times daily  . Cholecalciferol 1.25 MG (50000 UT) TABS 1 tablet, Oral, Daily  . ELIQUIS 5 MG TABS tablet TAKE 1 TABLET BY MOUTH TWICE A DAY  .  ferrous sulfate 325 mg, Oral, Daily with breakfast  . furosemide (LASIX) 20 mg, Oral, As needed  . psyllium (METAMUCIL) 0.52 g capsule 2 capsules with 8 ounces of liquid  . rosuvastatin (CRESTOR) 10 mg, Oral, Daily   Radiology:   Chest x-ray PA and lateral view 09/30/2019: There is minimal scarring versus atelectasis on the lateral left lung base. No focal infiltrate is seen. There is no pleural effusion or pneumothorax. Cardiac silhouette is borderline enlarged. Mediastinal contours are normal. There are mild degenerative changes at the shoulders and throughout the spine. Impression:  Borderline cardiomegaly. No visualized acute abnormality.   Chest x-ray AP 05/16/2020:  Stable left greater than right effusions and bibasilar atelectasis. Cannot exclude developing infection.   Cardiac Studies:   Lexiscan (Walking with mod Bruce)Tetrofosmin Stress Test  10/27/2019:  Nondiagnostic ECG stress. Inferior and inferolateral breast tissue attenuation noted. Myocardial perfusion is normal. Overall LV systolic function is normal without regional wall motion abnormalities. Stress LV EF: 55%.  No previous exam available for comparison. Low risk study.   PCV ECHOCARDIOGRAM LIMITED 07/29/2020 Left ventricle cavity is normal in size. Mild concentric hypertrophy of the left ventricle. Normal global wall motion. Normal  LV systolic function with visual EF 50-55%. Doppler evidence of grade I (impaired) diastolic dysfunction, elevated LAP. Left atrial cavity is at least moderately dilated. Moderate (Grade III) mitral regurgitation. Mild to moderate tricuspid regurgitation. Estimated pulmonary artery systolic pressure 44 mmHg. Compared to previous study on 11/05/2019, pulmonary hypertension is new.  EKG  EKG 01/13/2021: Sinus rhythm at a rate of 60 bpm.  Left axis.  Incomplete right bundle branch block.  Poor refreshing, cannot exclude anteroseptal infarct old.  Low voltage complexes.  Compared to EKG 08/26/2020, nonspecific T wave abnormality less prominent.  EKG 08/26/2020: Marked sinus bradycardia with first-degree AV block at a rate of 49 bpm.  Left axis.  Incomplete right bundle branch block.  PRWP, cannot exclude anteroseptal infarct old.  Nonspecific T wave abnormality. Borderline QT interval.   EKG 08/05/2020: Marked sinus bradycardia at the rate of 45 bpm, left axis deviation, incomplete right bundle branch block.  Poor R wave progression, cannot exclude anteroseptal infarct old.  Nonspecific T abnormality.  Normal QT interval.    EKG 05/25/2020:Sinus bradycardia at a rate of 57 bpm with first-degree AV block, left atrial enlargement.  Normal axis.  Incomplete right bundle branch block.  Poor R wave progression, cannot exclude anterior infarct old.  Nonspecific T wave abnormality.  Compared to EKG 10/15/2019, first-degree AV block and left atrial enlargement new.  EKG 10/01/2019: Atrial fibrillation with rapid ventricular response with rate of 111 bpm, normal axis, nonspecific ST depression in V1 and V2, cannot exclude septal ischemia.  Low-voltage complexes.  Abnormal EKG.  Assessment     ICD-10-CM   1. Primary hypertension  I10 EKG 76-KGSU    Basic metabolic panel  2. Paroxysmal atrial fibrillation ([place]). CHA2DS2-VASc Score is 3.  Yearly risk of stroke: 3.2% (A, F, HTN)  I48.0 EKG 12-Lead    amiodarone  (PACERONE) 100 MG tablet  3. Hypercholesteremia  E78.00    Meds ordered this encounter  Medications  . amiodarone (PACERONE) 100 MG tablet    Sig: Take 1 tablet (100 mg total) by mouth daily.    Dispense:  90 tablet    Refill:  3  . amLODipine (NORVASC) 5 MG tablet    Sig: Take 2 tablets (10 mg total) by mouth daily.    Dispense:  180 tablet  Refill:  3    Medications Discontinued During This Encounter  Medication Reason  . hydrochlorothiazide (MICROZIDE) 12.5 MG capsule Discontinued by provider  . amiodarone (PACERONE) 200 MG tablet Reorder  . amLODipine (NORVASC) 5 MG tablet Reorder    This patients CHA2DS2-VASc Score 3 (A, F, HTN) and yearly risk of stroke 3.2%.   Recommendations:   Amanda Frazier  is a 75 y.o. Caucasian female with hypertension, hyperlipidemia, hyperglycemia seen in the emergency room on 10/01/2019 for atrial fibrillation.  She was admitted to the hospital on 03/30/2020 with Covid pneumonia, and again admitted on 05/10/2020 for A. fib with RVR.  She was recently seen in the emergency department at [facility] 05/16/2021 in A. fib with RVR and heart failure, and underwent successful cardioversion on 05/18/2020. Etiology for atrial fibrillation includes uncontrolled hypertension, and obesity.    Patient presents for 3 month follow up of atrial fibrillation and hypertension. At last visit no changes were made.  Patient is feeling well and remains asymptomatic.  She has had no known recurrence of atrial fibrillation and is tolerating present medications without issue.  Patient is congratulated regarding significant diet and lifestyle modifications, and encouraged to continue with these changes as well as weight loss.  Patient appears very motivated.  In regard to hypertension, blood pressure remains mildly elevated.  Patient has had multiple medication intolerances both to ACE inhibitor's and ARB's as well as renal dysfunction with hydrochlorothiazide.  Shared decision was to  proceed with increasing amlodipine from 5 mg to 10 mg daily.  Patient will notify our office if she has issues with this dosage increased or if blood pressure remains >130/80 mmHg consistently.  She is tolerating anticoagulation without bleeding diathesis, will continue this.  Lipids are well controlled.  We will repeat BMP as she has not had follow-up since discontinuation of hydrochlorothiazide.  Patient will continue to need annual monitoring of PFTs, thyroid function, liver function, and ophthalmologic exam as she is being treated with amiodarone.  Follow-up in 6 months, sooner if needed, for atrial fibrillation, hyperlipidemia, and hypertension.   Alethia Berthold, PA-C 01/13/2021, 9:19 AM Office: [phone]

## 2021-01-13 ENCOUNTER — Ambulatory Visit: Payer: Medicare HMO | Admitting: Student

## 2021-01-13 ENCOUNTER — Other Ambulatory Visit: Payer: Self-pay

## 2021-01-13 ENCOUNTER — Encounter: Payer: Self-pay | Admitting: Student

## 2021-01-13 VITALS — BP 144/62 | HR 62 | Temp 98.1°F | Ht 64.0 in | Wt 179.0 lb

## 2021-01-13 DIAGNOSIS — I10 Essential (primary) hypertension: Secondary | ICD-10-CM

## 2021-01-13 DIAGNOSIS — E7800 Pure hypercholesterolemia, unspecified: Secondary | ICD-10-CM

## 2021-01-13 DIAGNOSIS — I480 Paroxysmal atrial fibrillation: Secondary | ICD-10-CM

## 2021-01-13 MED ORDER — AMIODARONE HCL 100 MG PO TABS: 100.0000 mg | ORAL_TABLET | Freq: Every day | ORAL | 3 refills | Status: DC

## 2021-01-13 MED ORDER — AMLODIPINE BESYLATE 5 MG PO TABS: 10.0000 mg | ORAL_TABLET | Freq: Every day | ORAL | 3 refills | Status: DC

## 2021-01-14 ENCOUNTER — Other Ambulatory Visit: Payer: Self-pay | Admitting: Student

## 2021-01-15 LAB — BASIC METABOLIC PANEL
BUN/Creatinine Ratio: 14 (ref 12–28)
BUN: 19 mg/dL (ref 8–27)
CO2: 23 mmol/L (ref 20–29)
Calcium: 9.3 mg/dL (ref 8.7–10.3)
Chloride: 104 mmol/L (ref 96–106)
Creatinine, Ser: 1.34 mg/dL — ABNORMAL HIGH (ref 0.57–1.00)
Glucose: 113 mg/dL — ABNORMAL HIGH (ref 65–99)
Potassium: 4.7 mmol/L (ref 3.5–5.2)
Sodium: 140 mmol/L (ref 134–144)
eGFR: 41 mL/min/{1.73_m2} — ABNORMAL LOW (ref >59)

## 2021-01-21 DIAGNOSIS — H6593 Unspecified nonsuppurative otitis media, bilateral: Secondary | ICD-10-CM | POA: Insufficient documentation

## 2021-06-27 NOTE — Progress Notes (Signed)
06/23/2021: A1c 4.7% Hgb 8.3, HCT 25.7, MCV 80.2, platelet 238 BUN 24, creatinine 1.35, GFR 41, sodium 142, potassium 4.2 TSH 3.57 Total cholesterol 190, triglycerides 95, HDL 59, LDL 114

## 2021-07-01 ENCOUNTER — Telehealth: Payer: Self-pay | Admitting: Hematology

## 2021-07-01 NOTE — Telephone Encounter (Signed)
Scheduled appt per 11/3 referral. Pt is aware of appt date and time.  

## 2021-07-06 ENCOUNTER — Other Ambulatory Visit: Payer: Self-pay | Admitting: Gastroenterology

## 2021-07-07 ENCOUNTER — Other Ambulatory Visit: Payer: Self-pay | Admitting: Gastroenterology

## 2021-07-11 NOTE — Anesthesia Preprocedure Evaluation (Addendum)
Anesthesia Evaluation  Patient identified by MRN, date of birth, ID band Patient awake    Reviewed: Allergy & Precautions, NPO status , Patient's Chart, lab work & pertinent test results  Airway Mallampati: II  TM Distance: >3 FB Neck ROM: Full    Dental no notable dental hx.    Pulmonary neg pulmonary ROS,    Pulmonary exam normal        Cardiovascular hypertension, Pt. on medications + dysrhythmias Atrial Fibrillation  Rhythm:Regular Rate:Normal     Neuro/Psych negative neurological ROS  negative psych ROS   GI/Hepatic negative GI ROS, Neg liver ROS,   Endo/Other  negative endocrine ROS  Renal/GU negative Renal ROS  negative genitourinary   Musculoskeletal negative musculoskeletal ROS (+)   Abdominal Normal abdominal exam  (+)   Peds  Hematology  (+) anemia ,   Anesthesia Other Findings   Reproductive/Obstetrics                            Anesthesia Physical Anesthesia Plan  ASA: 3  Anesthesia Plan: MAC   Post-op Pain Management:    Induction: Intravenous  PONV Risk Score and Plan: 2 and Propofol infusion and Treatment may vary due to age or medical condition  Airway Management Planned: Simple Face Mask, Natural Airway and Nasal Cannula  Additional Equipment: None  Intra-op Plan:   Post-operative Plan:   Informed Consent: I have reviewed the patients History and Physical, chart, labs and discussed the procedure including the risks, benefits and alternatives for the proposed anesthesia with the patient or authorized representative who has indicated his/her understanding and acceptance.     Dental advisory given  Plan Discussed with: CRNA  Anesthesia Plan Comments:        Anesthesia Quick Evaluation

## 2021-07-12 ENCOUNTER — Other Ambulatory Visit: Payer: Self-pay

## 2021-07-12 ENCOUNTER — Ambulatory Visit (HOSPITAL_COMMUNITY): Payer: Medicare HMO | Admitting: Anesthesiology

## 2021-07-12 ENCOUNTER — Encounter (HOSPITAL_COMMUNITY): Payer: Self-pay | Admitting: Gastroenterology

## 2021-07-12 ENCOUNTER — Encounter (HOSPITAL_COMMUNITY)
Admission: RE | Disposition: A | Discharge status: Home / Self Care | Payer: Self-pay | Source: Ambulatory Visit | Attending: Gastroenterology

## 2021-07-12 ENCOUNTER — Ambulatory Visit (HOSPITAL_COMMUNITY)
Admission: RE | Admit: 2021-07-12 | Discharge: 2021-07-12 | Disposition: A | Discharge status: Home / Self Care | Payer: Medicare HMO | Source: Ambulatory Visit | Attending: Gastroenterology | Admitting: Gastroenterology

## 2021-07-12 DIAGNOSIS — K449 Diaphragmatic hernia without obstruction or gangrene: Secondary | ICD-10-CM | POA: Insufficient documentation

## 2021-07-12 DIAGNOSIS — I10 Essential (primary) hypertension: Secondary | ICD-10-CM | POA: Diagnosis not present

## 2021-07-12 DIAGNOSIS — K5730 Diverticulosis of large intestine without perforation or abscess without bleeding: Secondary | ICD-10-CM | POA: Diagnosis not present

## 2021-07-12 DIAGNOSIS — K640 First degree hemorrhoids: Secondary | ICD-10-CM | POA: Insufficient documentation

## 2021-07-12 DIAGNOSIS — Z7901 Long term (current) use of anticoagulants: Secondary | ICD-10-CM | POA: Diagnosis not present

## 2021-07-12 DIAGNOSIS — K317 Polyp of stomach and duodenum: Secondary | ICD-10-CM | POA: Diagnosis not present

## 2021-07-12 DIAGNOSIS — Z800 Family history of malignant neoplasm of digestive organs: Secondary | ICD-10-CM | POA: Insufficient documentation

## 2021-07-12 DIAGNOSIS — K222 Esophageal obstruction: Secondary | ICD-10-CM | POA: Insufficient documentation

## 2021-07-12 DIAGNOSIS — D125 Benign neoplasm of sigmoid colon: Secondary | ICD-10-CM | POA: Diagnosis not present

## 2021-07-12 DIAGNOSIS — K2900 Acute gastritis without bleeding: Secondary | ICD-10-CM | POA: Diagnosis not present

## 2021-07-12 DIAGNOSIS — I4891 Unspecified atrial fibrillation: Secondary | ICD-10-CM | POA: Diagnosis not present

## 2021-07-12 DIAGNOSIS — K922 Gastrointestinal hemorrhage, unspecified: Secondary | ICD-10-CM | POA: Diagnosis not present

## 2021-07-12 DIAGNOSIS — D509 Iron deficiency anemia, unspecified: Secondary | ICD-10-CM | POA: Diagnosis present

## 2021-07-12 HISTORY — PX: COLONOSCOPY WITH PROPOFOL: SHX5780

## 2021-07-12 HISTORY — PX: ESOPHAGOGASTRODUODENOSCOPY (EGD) WITH PROPOFOL: SHX5813

## 2021-07-12 HISTORY — PX: SUBMUCOSAL TATTOO INJECTION: SHX6856

## 2021-07-12 HISTORY — PX: POLYPECTOMY: SHX5525

## 2021-07-12 HISTORY — PX: SCLEROTHERAPY: SHX6841

## 2021-07-12 HISTORY — PX: BIOPSY: SHX5522

## 2021-07-12 LAB — GLUCOSE, CAPILLARY: Glucose-Capillary: 176 mg/dL — ABNORMAL HIGH (ref 70–99)

## 2021-07-12 SURGERY — COLONOSCOPY WITH PROPOFOL
Anesthesia: Monitor Anesthesia Care

## 2021-07-12 MED ORDER — SODIUM CHLORIDE 0.9 % IV SOLN: INTRAVENOUS | Status: DC

## 2021-07-12 MED ORDER — SPOT INK MARKER SYRINGE KIT
PACK | SUBMUCOSAL | Status: AC
  Filled 2021-07-12: qty 5

## 2021-07-12 MED ORDER — PROPOFOL 1000 MG/100ML IV EMUL
INTRAVENOUS | Status: AC
  Filled 2021-07-12: qty 100

## 2021-07-12 MED ORDER — PROPOFOL 500 MG/50ML IV EMUL
INTRAVENOUS | Status: DC | PRN
  Administered 2021-07-12: 125 ug/kg/min via INTRAVENOUS

## 2021-07-12 MED ORDER — SODIUM CHLORIDE (PF) 0.9 % IJ SOLN
PREFILLED_SYRINGE | INTRAMUSCULAR | Status: DC | PRN
  Administered 2021-07-12: 3 mL

## 2021-07-12 MED ORDER — PROPOFOL 10 MG/ML IV BOLUS
INTRAVENOUS | Status: DC | PRN
  Administered 2021-07-12 (×5): 20 mg via INTRAVENOUS

## 2021-07-12 MED ORDER — LACTATED RINGERS IV SOLN
INTRAVENOUS | Status: AC | PRN
  Administered 2021-07-12: 1000 mL via INTRAVENOUS

## 2021-07-12 MED ORDER — SPOT INK MARKER SYRINGE KIT
PACK | SUBMUCOSAL | Status: DC | PRN
  Administered 2021-07-12: 1 mL via SUBMUCOSAL

## 2021-07-12 MED ORDER — LIDOCAINE 2% (20 MG/ML) 5 ML SYRINGE
INTRAMUSCULAR | Status: DC | PRN
  Administered 2021-07-12: 80 mg via INTRAVENOUS

## 2021-07-12 MED ORDER — LACTATED RINGERS IV SOLN: INTRAVENOUS | Status: DC | PRN

## 2021-07-12 SURGICAL SUPPLY — 25 items

## 2021-07-12 NOTE — Op Note (Signed)
[facility] Patient Name: Amanda Frazier Procedure Date: 07/12/2021 MRN: [id number] Attending MD: Lear Ng , MD Date of Birth: [birth year] CSN: [id number] Age: 75 Admit Type: Inpatient Procedure:                Colonoscopy Indications:              Iron deficiency anemia Providers:                Lear Ng, MD, Carmie End, RN,                            [name], Technician Referring MD:             [name] Medicines:                Propofol per Anesthesia, Monitored Anesthesia Care Complications:            No immediate complications. Estimated Blood Loss:     Estimated blood loss: none. Procedure:                Pre-Anesthesia Assessment:                           - Prior to the procedure, a History and Physical                            was performed, and patient medications and                            allergies were reviewed. The patient's tolerance of                            previous anesthesia was also reviewed. The risks                            and benefits of the procedure and the sedation                            options and risks were discussed with the patient.                            All questions were answered, and informed consent                            was obtained. Prior Anticoagulants: The patient has                            taken Eliquis (apixaban), last dose was 2 days                            prior to procedure. ASA Grade Assessment: III - A                            patient with severe systemic disease. After  reviewing the risks and benefits, the patient was                            deemed in satisfactory condition to undergo the                            procedure.                           After obtaining informed consent, the colonoscope                            was passed under direct vision. Throughout the                            procedure, the  patient's blood pressure, pulse, and                            oxygen saturations were monitored continuously. The                            [id number]-HQ190L ([id number] Olympus colonoscope was                            introduced through the anus and advanced to the the                            cecum, identified by appendiceal orifice and                            ileocecal valve. The colonoscopy was performed with                            difficulty due to significant looping, a tortuous                            colon and fair prep. Successful completion of the                            procedure was aided by straightening and shortening                            the scope to obtain bowel loop reduction, using                            scope torsion, applying abdominal pressure and                            lavage. The patient tolerated the procedure well.                            The quality of the bowel preparation was fair and  fair but repeated irrigation led to a good and                            adequate prep. Scope In: 8:00:56 AM Scope Out: 8:18:07 AM Scope Withdrawal Time: 0 hours 10 minutes 34 seconds  Total Procedure Duration: 0 hours 17 minutes 11 seconds  Findings:      The perianal and digital rectal examinations were normal.      A 8 mm polyp was found in the sigmoid colon. The polyp was semi-sessile.       The polyp was removed with a hot snare. Resection and retrieval were       complete. Estimated blood loss: none.      Scattered small-mouthed diverticula were found in the sigmoid colon.      Internal hemorrhoids were found during retroflexion. The hemorrhoids       were small and Grade I (internal hemorrhoids that do not prolapse).      The terminal ileum appeared normal. Impression:               - Preparation of the colon was fair.                           - One 8 mm polyp in the sigmoid colon, removed with                             a hot snare. Resected and retrieved.                           - Diverticulosis in the sigmoid colon.                           - Internal hemorrhoids.                           - The examined portion of the ileum was normal. Moderate Sedation:      N/A - MAC procedure Recommendation:           - Patient has a contact number available for                            emergencies. The signs and symptoms of potential                            delayed complications were discussed with the                            patient. Return to normal activities tomorrow.                            Written discharge instructions were provided to the                            patient.                           - High fiber diet.                           -  Await pathology results.                           - Resume Eliquis (apixaban) at prior dose in 2 days. Procedure Code(s):        --- Professional ---                           [phone], Colonoscopy, flexible; with removal of                            tumor(s), polyp(s), or other lesion(s) by snare                            technique Diagnosis Code(s):        --- Professional ---                           D50.9, Iron deficiency anemia, unspecified                           K63.5, Polyp of colon                           K64.0, First degree hemorrhoids                           K57.30, Diverticulosis of large intestine without                            perforation or abscess without bleeding CPT copyright 2019 [employer]. All rights reserved. The codes documented in this report are preliminary and upon coder review may  be revised to meet current compliance requirements. Lear Ng, MD 07/12/2021 8:38:57 AM This report has been signed electronically. Number of Addenda: 0

## 2021-07-12 NOTE — Op Note (Signed)
[facility] Patient Name: Amanda Amanda Procedure Date: 07/12/2021 MRN: [id number] Attending MD: Lear Ng , MD Date of Birth: 1945-12-16 CSN: [id number] Age: 75 Admit Type: Outpatient Procedure:                Upper GI endoscopy Indications:              Iron deficiency anemia Providers:                Lear Ng, MD, Carmie End, RN, Cherylynn Ridges, Technician, Amanda Amanda, Technician Referring MD:             [name] Medicines:                Propofol per Anesthesia, Monitored Anesthesia Care Complications:            No immediate complications. Estimated Blood Loss:     Estimated blood loss was minimal. Procedure:                Pre-Anesthesia Assessment:                           - Prior to the procedure, a History and Physical                            was performed, and patient medications and                            allergies were reviewed. The patient's tolerance of                            previous anesthesia was also reviewed. The risks                            and benefits of the procedure and the sedation                            options and risks were discussed with the patient.                            All questions were answered, and informed consent                            was obtained. Prior Anticoagulants: The patient has                            taken Eliquis (apixaban), last dose was 2 days                            prior to procedure. ASA Grade Assessment: III - A                            patient with severe systemic disease. After  reviewing the risks and benefits, the patient was                            deemed in satisfactory condition to undergo the                            procedure.                           After obtaining informed consent, the endoscope was                            passed under direct vision. Throughout the                             procedure, the patient's blood pressure, pulse, and                            oxygen saturations were monitored continuously. The                            [id number]-H190 ([id number] Olympus endoscope was introduced                            through the mouth, and advanced to the second part                            of duodenum. The upper GI endoscopy was                            accomplished without difficulty. The patient                            tolerated the procedure well. Scope In: Scope Out: Findings:      The Z-line was regular and was found 40 cm from the incisors.      Multiple 5 to 30 mm pedunculated polyps with bleeding and stigmata of       recent bleeding were found in the gastric body. Polypectomy was not       attempted due to polyp size (too large to be excised).      A single 20 mm pedunculated polyp with bleeding and stigmata of recent       bleeding was found in the gastric body. Biopsies were taken with a cold       forceps for histology. Estimated blood loss: 5 mL requiring treatment       with epinephrine:saline mixture (3cc injected). Area was tattooed with       an injection of 1 mL of Spot (carbon black).      Localized mild inflammation characterized by congestion (edema) and       erythema was found in the gastric antrum. Biopsies were taken with a       cold forceps for histology. Estimated blood loss was minimal.      A small hiatal hernia was present.      The examined duodenum was normal.      Red blood was found  in the gastric body.      A low-grade of narrowing Schatzki ring was found at the gastroesophageal       junction. Impression:               - Z-line regular, 40 cm from the incisors.                           - Multiple gastric polyps. Resection not attempted.                           - A single gastric polyp. Biopsied. Tattooed.                           - Acute gastritis. Biopsied.                           - Small  hiatal hernia.                           - Normal examined duodenum.                           - Red blood in the gastric body.                           - Low-grade of narrowing Schatzki ring. Moderate Sedation:      N/A - MAC procedure Recommendation:           - Patient has a contact number available for                            emergencies. The signs and symptoms of potential                            delayed complications were discussed with the                            patient. Return to normal activities tomorrow.                            Written discharge instructions were provided to the                            patient.                           - Follow an antireflux regimen.                           - Await pathology results.                           - Resume Eliquis (apixaban) at prior dose in 2 days. Procedure Code(s):        --- Professional ---                           [phone],  Esophagogastroduodenoscopy, flexible,                            transoral; with biopsy, single or multiple Diagnosis Code(s):        --- Professional ---                           D50.9, Iron deficiency anemia, unspecified                           K92.2, Gastrointestinal hemorrhage, unspecified                           K29.00, Acute gastritis without bleeding                           K31.7, Polyp of stomach and duodenum                           K22.2, Esophageal obstruction                           K44.9, Diaphragmatic hernia without obstruction or                            gangrene CPT copyright 2019 [employer]. All rights reserved. The codes documented in this report are preliminary and upon coder review may  be revised to meet current compliance requirements. Lear Ng, MD 07/12/2021 8:34:38 AM This report has been signed electronically. Number of Addenda: 0

## 2021-07-12 NOTE — Transfer of Care (Signed)
Immediate Anesthesia Transfer of Care Note  Patient: Amanda Frazier  Procedure(s) Performed: COLONOSCOPY WITH PROPOFOL ESOPHAGOGASTRODUODENOSCOPY (EGD) WITH PROPOFOL BIOPSY SCLEROTHERAPY SUBMUCOSAL TATTOO INJECTION HOT HEMOSTASIS (ARGON PLASMA COAGULATION/BICAP)  Patient Location: PACU  Anesthesia Type:MAC  Level of Consciousness: awake, alert  and oriented  Airway & Oxygen Therapy: Patient Spontanous Breathing and Patient connected to face mask oxygen  Post-op Assessment: Report given to RN and Post -op Vital signs reviewed and stable  Post vital signs: Reviewed and stable  Last Vitals:  Vitals Value Taken Time  BP    Temp    Pulse 62 07/12/21 0825  Resp    SpO2 100 % 07/12/21 0825  Vitals shown include unvalidated device data.  Last Pain:  Vitals:   07/12/21 0718  TempSrc: Oral  PainSc: 0-No pain         Complications: No notable events documented.

## 2021-07-12 NOTE — Interval H&P Note (Signed)
History and Physical Interval Note:  07/12/2021 7:31 AM  Amanda Frazier  has presented today for surgery, with the diagnosis of Iron deficiency anemia.  The various methods of treatment have been discussed with the patient and family. After consideration of risks, benefits and other options for treatment, the patient has consented to  Procedure(s): COLONOSCOPY WITH PROPOFOL (N/A) ESOPHAGOGASTRODUODENOSCOPY (EGD) WITH PROPOFOL (N/A) as a surgical intervention.  The patient's history has been reviewed, patient examined, no change in status, stable for surgery.  I have reviewed the patient's chart and labs.  Questions were answered to the patient's satisfaction.     Lear Ng

## 2021-07-12 NOTE — Anesthesia Postprocedure Evaluation (Signed)
Anesthesia Post Note  Patient: Amanda Frazier  Procedure(s) Performed: COLONOSCOPY WITH PROPOFOL ESOPHAGOGASTRODUODENOSCOPY (EGD) WITH PROPOFOL BIOPSY SCLEROTHERAPY SUBMUCOSAL TATTOO INJECTION HOT HEMOSTASIS (ARGON PLASMA COAGULATION/BICAP) POLYPECTOMY     Patient location during evaluation: Endoscopy Anesthesia Type: MAC Level of consciousness: awake and alert Pain management: pain level controlled Vital Signs Assessment: post-procedure vital signs reviewed and stable Respiratory status: spontaneous breathing, nonlabored ventilation, respiratory function stable and patient connected to nasal cannula oxygen Cardiovascular status: stable and blood pressure returned to baseline Postop Assessment: no apparent nausea or vomiting Anesthetic complications: no   No notable events documented.  Last Vitals:  Vitals:   07/12/21 0836 07/12/21 0846  BP: (!) 126/41 (!) 134/46  Pulse:    Resp:    Temp:    SpO2:      Last Pain:  Vitals:   07/12/21 0846  TempSrc:   PainSc: 0-No pain                 [name] [name]

## 2021-07-12 NOTE — H&P (Signed)
Date of Initial H&P: 07/06/21  History reviewed, patient examined, no change in status, stable for surgery.

## 2021-07-12 NOTE — Discharge Instructions (Signed)
YOU HAD AN ENDOSCOPIC PROCEDURE TODAY: Refer to the procedure report and other information in the discharge instructions given to you for any specific questions about what was found during the examination. If this information does not answer your questions, please call [place] office at [phone] to clarify.   YOU SHOULD EXPECT: Some feelings of bloating in the abdomen. Passage of more gas than usual. Walking can help get rid of the air that was put into your GI tract during the procedure and reduce the bloating. If you had a lower endoscopy (such as a colonoscopy or flexible sigmoidoscopy) you may notice spotting of blood in your stool or on the toilet paper. Some abdominal soreness may be present for a day or two, also.  DIET: Your first meal following the procedure should be a light meal and then it is ok to progress to your normal diet. A half-sandwich or bowl of soup is an example of a good first meal. Heavy or fried foods are harder to digest and may make you feel nauseous or bloated. Drink plenty of fluids but you should avoid alcoholic beverages for 24 hours. If you had a esophageal dilation, please see attached instructions for diet.    ACTIVITY: Your care partner should take you home directly after the procedure. You should plan to take it easy, moving slowly for the rest of the day. You can resume normal activity the day after the procedure however YOU SHOULD NOT DRIVE, use power tools, machinery or perform tasks that involve climbing or major physical exertion for 24 hours (because of the sedation medicines used during the test).   SYMPTOMS TO REPORT IMMEDIATELY: A gastroenterologist can be reached at any hour. Please call [phone]  for any of the following symptoms:   Following lower endoscopy (colonoscopy, flexible sigmoidoscopy) Excessive amounts of blood in the stool  Significant tenderness, worsening of abdominal pains  Swelling of the abdomen that is new, acute  Fever of 100  or higher  Following upper endoscopy (EGD, EUS, ERCP, esophageal dilation) Vomiting of blood or coffee ground material  New, significant abdominal pain  New, significant chest pain or pain under the shoulder blades  Painful or persistently difficult swallowing  New shortness of breath  Black, tarry-looking or red, bloody stools  FOLLOW UP:  If any biopsies were taken you will be contacted by phone or by letter within the next 1-3 weeks. Call [phone]  if you have not heard about the biopsies in 3 weeks.  Please also call with any specific questions about appointments or follow up tests.   

## 2021-07-13 ENCOUNTER — Encounter (HOSPITAL_COMMUNITY): Payer: Self-pay | Admitting: Gastroenterology

## 2021-07-13 LAB — SURGICAL PATHOLOGY

## 2021-07-15 NOTE — Progress Notes (Signed)
Primary Physician/Referring:  [name], MD  Patient ID: Amanda Frazier, female    DOB: [birth year], 75 y.o.   MRN: [id number]  Chief Complaint  Patient presents with   Hypertension   Follow-up   Atrial Fibrillation   HPI:    Amanda Frazier  is a 75 y.o. Caucasian female with hypertension, hyperlipidemia, hyperglycemia seen in the emergency room on 10/01/2019 for atrial fibrillation.  She was admitted to the hospital on 03/30/2020 with Covid pneumonia, and again admitted on 05/10/2020 for A. fib with RVR.  She was recently seen in the emergency department at [facility] 05/16/2021 in A. fib with RVR and heart failure, and underwent successful cardioversion on 05/18/2020.    Patient presents for 11-monthfollow-up.  Last office visit increased amlodipine from 5 mg to 10 mg daily due to uncontrolled hypertension.  Patient continues to feel well overall, in fact she states she feels the best she has in quite a long time.  Patient reports she does "100% of what she has to do and about 80% of what she wants to do".  She continues to monitor blood pressure daily at home with average 130-135/60s mmHg.  She continues to tolerate anticoagulation without bleeding diathesis, however she did recently undergo evaluation for anemia with endoscopy which noted a large polyp [facility]  Notably since last office visit patient has gained approximately 5 pounds, she admits to eating poorly on vacation recently.  Notably hydrochlorothiazide was stopped previously due to renal dysfunction.  Patient denies chest pain, palpitations, dyspnea, dizziness, syncope, near syncope.  Denies leg swelling, orthopnea, PND.  Past Medical History:  Diagnosis Date   Atrial fibrillation ([place]    Hyperlipidemia    Hypertension    Family History  Problem Relation Age of Onset   Breast cancer Maternal Aunt    Atrial fibrillation Mother    Hyperlipidemia Mother     Lung cancer Mother    Atrial fibrillation Father    Colon cancer Father    Breast cancer Sister    Leukemia Brother    Past Surgical History:  Procedure Laterality Date   BIOPSY  07/12/2021   Procedure: BIOPSY;  Surgeon: SWilford Corner MD;  Location: [facility];  Service: Endoscopy;;   CARDIOVERSION  2021   COLONOSCOPY WITH PROPOFOL N/A 07/12/2021   Procedure: COLONOSCOPY WITH PROPOFOL;  Surgeon: SWilford Corner MD;  Location: [facility];  Service: Endoscopy;  Laterality: N/A;   ESOPHAGOGASTRODUODENOSCOPY (EGD) WITH PROPOFOL N/A 07/12/2021   Procedure: ESOPHAGOGASTRODUODENOSCOPY (EGD) WITH PROPOFOL;  Surgeon: SWilford Corner MD;  Location: [facility];  Service: Endoscopy;  Laterality: N/A;   POLYPECTOMY  07/12/2021   Procedure: POLYPECTOMY;  Surgeon: SWilford Corner MD;  Location: [facility];  Service: Endoscopy;;   REPLACEMENT TOTAL KNEE Left 2015   SCLEROTHERAPY  07/12/2021   Procedure: SCLEROTHERAPY;  Surgeon: SWilford Corner MD;  Location: [facility];  Service: Endoscopy;;   SUBMUCOSAL TATTOO INJECTION  07/12/2021   Procedure: SUBMUCOSAL TATTOO INJECTION;  Surgeon: SWilford Corner MD;  Location: [facility];  Service: Endoscopy;;   Social History   Tobacco Use   Smoking status: Never   Smokeless tobacco: Never  Substance Use Topics   Alcohol use: Never  Marital Status: Married  ROS  Review of Systems  Cardiovascular:  Negative for chest pain, claudication, dyspnea on exertion, leg swelling, near-syncope, orthopnea, palpitations, paroxysmal nocturnal dyspnea and syncope.  Hematologic/Lymphatic: Does not bruise/bleed easily.  Neurological:  Negative for  dizziness.  Objective  Blood pressure (!) 144/59, pulse 73, temperature 97.6 F (36.4 C), height _0  (1.6 m), weight 184 lb 3.2 oz (83.6 kg), SpO2 100 %.  Vitals with BMI 07/18/2021 07/18/2021 07/12/2021  Height - _1  -  Weight - 184 lbs 3 oz -  BMI - 21.19 -  Systolic 417 408 144   Diastolic 59 55 46  Pulse - 73 -     Physical Exam Vitals reviewed.  Cardiovascular:     Rate and Rhythm: Normal rate and regular rhythm.     Pulses: Intact distal pulses.          Carotid pulses are 2+ on the right side and 2+ on the left side.      Radial pulses are 2+ on the right side and 2+ on the left side.       Posterior tibial pulses are 2+ on the right side and 2+ on the left side.     Heart sounds: S1 normal and S2 normal. Murmur heard.  High-pitched blowing holosystolic murmur is present with a grade of 2/6 at the apex.    No gallop.     Comments: No JVD.  Pulmonary:     Effort: Pulmonary effort is normal. No respiratory distress.     Breath sounds: Normal breath sounds. No wheezing, rhonchi or rales.  Musculoskeletal:     Right lower leg: Edema (trace) present.     Left lower leg: Edema (trace) present.  Neurological:     Mental Status: She is alert.   Laboratory examination:   CMP Latest Ref Rng & Units 01/14/2021 09/02/2020 12/09/2019  Glucose 65 - 99 mg/dL 113(H) 97 105(H)  BUN 8 - 27 mg/dL _2 Creatinine 0.57 - 1.00 mg/dL 1.34(H) 1.40(H) 1.23(H)  Sodium 134 - 144 mmol/L 140 142 138  Potassium 3.5 - 5.2 mmol/L 4.7 4.5 4.6  Chloride 96 - 106 mmol/L 104 103 101  CO2 20 - 29 mmol/L _3 Calcium 8.7 - 10.3 mg/dL 9.3 9.8 10.1   CBC Latest Ref Rng & Units 08/07/2014 08/06/2014 07/22/2014  WBC 3.6 - 11.0 x10 3/mm 3 - - 10.5  Hemoglobin 12.0 - 16.0 g/dL 10.4(L) 10.7(L) 12.1  Hematocrit 35.0 - 47.0 % - - 37.1  Platelets 150 - 440 x10 3/mm 3 274 274 342   Lipid Panel  No results found for: CHOL, TRIG, HDL, CHOLHDL, VLDL, LDLCALC, LDLDIRECT HEMOGLOBIN A1C No results found for: HGBA1C, MPG TSH No results for input(s): TSH in the last 8760 hours.  External labs:  06/23/2021: HDL 59, LDL 114, total cholesterol 190, triglycerides 95 A1c 4.7% BUN 24, creatinine 1.35, GFR 41 TSH 3.57  06/10/2020: HDL 43, LDL 90, total cholesterol 133, triglycerides  105 A1c 5.2% BUN 9.0, creatinine 1.23, EGFR 43 TSH 4.79  12/09/2019: BUN 22, SrCr 1.23  10/29/2019: TSH 2.06  Labs 10/01/2019: Hb 12.7/HCT 40.0, platelets 324.  Serum glucose 116 mg, BUN 18, creatinine 1.09, EGFR 50 mL.  Calcium minimally elevated at 10.4, CMP otherwise normal.    05/20/2019: HDL 53, LDL 103, triglycerides 121, total 180  Allergies   Allergies  Allergen Reactions   Celecoxib Nausea And Vomiting   Fosamax [Alendronate Sodium] Nausea And Vomiting   Lipitor [Atorvastatin Calcium]     Fatigue   Valsartan-Hydrochlorothiazide     Other reaction(s): cough   Benazepril     Other reaction(s): Cough   Nickel     Other reaction(s): Other (See Comments)  Positive patch test   Palladium Chloride     Other reaction(s): Other (See Comments) Positive patch test   Simvastatin Diarrhea    Other reaction(s): diarrhea   Valsartan     Other reaction(s): Cough    Medications Prior to Visit:   Outpatient Medications Prior to Visit  Medication Sig Dispense Refill   amiodarone (PACERONE) 100 MG tablet Take 1 tablet (100 mg total) by mouth daily. 90 tablet 3   amLODipine (NORVASC) 5 MG tablet Take 2 tablets (10 mg total) by mouth daily. 180 tablet 3   calcium carbonate (TUMS - DOSED IN MG ELEMENTAL CALCIUM) 500 MG chewable tablet Chew 1 tablet by mouth 2 (two) times daily.     Cholecalciferol (VITAMIN D3) 125 MCG (5000 UT) CAPS Take 5,000 Units by mouth daily.     ELIQUIS 5 MG TABS tablet TAKE 1 TABLET BY MOUTH TWICE A DAY 60 tablet 6   Ferrous Sulfate (IRON) 325 (65 Fe) MG TABS 1 tablet     furosemide (LASIX) 20 MG tablet TAKE 1 TABLET (20 MG TOTAL) BY MOUTH AS NEEDED FOR FLUID OR EDEMA. 90 tablet 1   METAMUCIL FIBER PO Take 2 capsules by mouth daily as needed (regularity).     omeprazole (PRILOSEC OTC) 20 MG tablet Take 20 mg by mouth daily.     rosuvastatin (CRESTOR) 10 MG tablet Take 20 mg by mouth daily.     vitamin B-12 (CYANOCOBALAMIN) 1000 MCG tablet Take 1,000 mcg by  mouth daily.     ferrous sulfate 325 (65 FE) MG tablet Take 325 mg by mouth every other day.     fluticasone (FLONASE) 50 MCG/ACT nasal spray SMARTSIG:2 Puff(s) Both Nares Every Night     No facility-administered medications prior to visit.   Final Medications at End of Visit    Current Meds  Medication Sig   amiodarone (PACERONE) 100 MG tablet Take 1 tablet (100 mg total) by mouth daily.   amLODipine (NORVASC) 5 MG tablet Take 2 tablets (10 mg total) by mouth daily.   calcium carbonate (TUMS - DOSED IN MG ELEMENTAL CALCIUM) 500 MG chewable tablet Chew 1 tablet by mouth 2 (two) times daily.   Cholecalciferol (VITAMIN D3) 125 MCG (5000 UT) CAPS Take 5,000 Units by mouth daily.   ELIQUIS 5 MG TABS tablet TAKE 1 TABLET BY MOUTH TWICE A DAY   Ferrous Sulfate (IRON) 325 (65 Fe) MG TABS 1 tablet   furosemide (LASIX) 20 MG tablet TAKE 1 TABLET (20 MG TOTAL) BY MOUTH AS NEEDED FOR FLUID OR EDEMA.   METAMUCIL FIBER PO Take 2 capsules by mouth daily as needed (regularity).   omeprazole (PRILOSEC OTC) 20 MG tablet Take 20 mg by mouth daily.   rosuvastatin (CRESTOR) 10 MG tablet Take 20 mg by mouth daily.   vitamin B-12 (CYANOCOBALAMIN) 1000 MCG tablet Take 1,000 mcg by mouth daily.   Radiology:   Chest x-ray PA and lateral view 09/30/2019: There is minimal scarring versus atelectasis on the lateral left lung base. No focal infiltrate is seen. There is no pleural effusion or pneumothorax. Cardiac silhouette is borderline enlarged. Mediastinal contours are normal. There are mild degenerative changes at the shoulders and throughout the spine. Impression:  Borderline cardiomegaly. No visualized acute abnormality.   Chest x-ray AP 05/16/2020:  Stable left greater than right effusions and bibasilar atelectasis. Cannot exclude developing infection.   Cardiac Studies:   Lexiscan (Walking with mod Bruce)Tetrofosmin Stress Test  10/27/2019:  Nondiagnostic ECG stress. Inferior and  inferolateral breast  tissue attenuation noted. Myocardial perfusion is normal. Overall LV systolic function is normal without regional wall motion abnormalities. Stress LV EF: 55%.  No previous exam available for comparison. Low risk study.   PCV ECHOCARDIOGRAM LIMITED 07/29/2020 Left ventricle cavity is normal in size. Mild concentric hypertrophy of the left ventricle. Normal global wall motion. Normal LV systolic function with visual EF 50-55%. Doppler evidence of grade I (impaired) diastolic dysfunction, elevated LAP. Left atrial cavity is at least moderately dilated. Moderate (Grade III) mitral regurgitation. Mild to moderate tricuspid regurgitation. Estimated pulmonary artery systolic pressure 44 mmHg. Compared to previous study on 11/05/2019, pulmonary hypertension is new.  EKG  07/18/2021: Sinus rhythm at a rate of 58 bpm.  Left axis, left anterior fascicular block.  Incomplete right bundle branch block.  Low voltage complexes, consider pulmonary disease pattern.  Poor R wave progression, cannot exclude anteroseptal infarct old.  Compared to EKG 01/13/2021, no significant change.  08/26/2020: Marked sinus bradycardia with first-degree AV block at a rate of 49 bpm.  Left axis.  Incomplete right bundle branch block.  PRWP, cannot exclude anteroseptal infarct old.  Nonspecific T wave abnormality. Borderline QT interval.   08/05/2020: Marked sinus bradycardia at the rate of 45 bpm, left axis deviation, incomplete right bundle branch block.  Poor R wave progression, cannot exclude anteroseptal infarct old.  Nonspecific T abnormality.  Normal QT interval.    05/25/2020:Sinus bradycardia at a rate of 57 bpm with first-degree AV block, left atrial enlargement.  Normal axis.  Incomplete right bundle branch block.  Poor R wave progression, cannot exclude anterior infarct old.  Nonspecific T wave abnormality.  Compared to EKG 10/15/2019, first-degree AV block and left atrial enlargement new.  10/01/2019: Atrial fibrillation  with rapid ventricular response with rate of 111 bpm, normal axis, nonspecific ST depression in V1 and V2, cannot exclude septal ischemia.  Low-voltage complexes.  Abnormal EKG.  Assessment     ICD-10-CM   1. Primary hypertension  I10     2. Paroxysmal atrial fibrillation ([place]). CHA2DS2-VASc Score is 3.  Yearly risk of stroke: 3.2% (A, F, HTN)  I48.0 EKG 12-Lead    3. Hypercholesteremia  E78.00 Lipid Panel With LDL/HDL Ratio    Lipid Panel With LDL/HDL Ratio     No orders of the defined types were placed in this encounter.   Medications Discontinued During This Encounter  Medication Reason   ferrous sulfate 325 (65 FE) MG tablet Duplicate   fluticasone (FLONASE) 50 MCG/ACT nasal spray     This patients CHA2DS2-VASc Score 3 (A, F, HTN) and yearly risk of stroke 3.2%.   Recommendations:   Amanda Frazier  is a 75 y.o. Caucasian female with hypertension, hyperlipidemia, hyperglycemia seen in the emergency room on 10/01/2019 for atrial fibrillation.  She was admitted to the hospital on 03/30/2020 with Covid pneumonia, and again admitted on 05/10/2020 for A. fib with RVR.  She was recently seen in the emergency department at [facility] 05/16/2021 in A. fib with RVR and heart failure, and underwent successful cardioversion on 05/18/2020. Etiology for atrial fibrillation includes uncontrolled hypertension, and obesity.    Patient presents for 77-monthfollow-up.  Last office visit increased amlodipine from 5 mg to 10 mg daily due to uncontrolled hypertension.  Patient's EKG and physical exam remained unchanged compared to previous.  She is maintaining sinus rhythm and tolerating anticoagulation without bleeding diathesis.  Although patient's blood pressure is elevated in the office today, home readings are under excellent control,  therefore advised that she continue to monitor blood pressure regularly at home and notify our office if it is >130/80 mmHg consistently.  I personally reviewed external labs,  LDL is uncontrolled at 114.  Shared decision was to increase Crestor from 10 mg to 20 mg nightly and repeat lipid profile testing in 3 months.  Notably patient has had multiple medication intolerances both ACE inhibitor's and ARB use as well as renal dysfunction with hydrochlorothiazide. Patient and her PCP are aware she needs annual monitoring of PFTs, thyroid function, liver function, and ophthalmologic exam as she is being treated with amiodarone.  Follow up in 6 months, sooner if needed, for atrial fibrillation, hyperlipidemia, and hypertension.    Alethia Berthold, PA-C 07/18/2021, 9:12 AM Office: [phone]

## 2021-07-18 ENCOUNTER — Other Ambulatory Visit: Payer: Self-pay

## 2021-07-18 ENCOUNTER — Ambulatory Visit: Payer: Medicare HMO | Admitting: Student

## 2021-07-18 ENCOUNTER — Encounter: Payer: Self-pay | Admitting: Student

## 2021-07-18 VITALS — BP 144/59 | HR 73 | Temp 97.6°F | Ht 63.0 in | Wt 184.2 lb

## 2021-07-18 DIAGNOSIS — I10 Essential (primary) hypertension: Secondary | ICD-10-CM

## 2021-07-18 DIAGNOSIS — I480 Paroxysmal atrial fibrillation: Secondary | ICD-10-CM

## 2021-07-18 DIAGNOSIS — E7800 Pure hypercholesterolemia, unspecified: Secondary | ICD-10-CM

## 2021-07-19 ENCOUNTER — Inpatient Hospital Stay (HOSPITAL_BASED_OUTPATIENT_CLINIC_OR_DEPARTMENT_OTHER): Payer: Medicare HMO | Admitting: Hematology

## 2021-07-19 ENCOUNTER — Inpatient Hospital Stay: Payer: Medicare HMO | Attending: Hematology

## 2021-07-19 VITALS — BP 140/56 | HR 77 | Temp 97.9°F | Resp 17 | Ht 63.0 in | Wt 185.4 lb

## 2021-07-19 DIAGNOSIS — E538 Deficiency of other specified B group vitamins: Secondary | ICD-10-CM

## 2021-07-19 DIAGNOSIS — Z79899 Other long term (current) drug therapy: Secondary | ICD-10-CM | POA: Diagnosis not present

## 2021-07-19 DIAGNOSIS — D500 Iron deficiency anemia secondary to blood loss (chronic): Secondary | ICD-10-CM

## 2021-07-19 DIAGNOSIS — D649 Anemia, unspecified: Secondary | ICD-10-CM

## 2021-07-19 LAB — CBC WITH DIFFERENTIAL/PLATELET
Abs Immature Granulocytes: 0 10*3/uL (ref 0.00–0.07)
Basophils Absolute: 0 10*3/uL (ref 0.0–0.1)
Basophils Relative: 0 %
Eosinophils Absolute: 0 10*3/uL (ref 0.0–0.5)
Eosinophils Relative: 1 %
HCT: 24.3 % — ABNORMAL LOW (ref 36.0–46.0)
Hemoglobin: 7.3 g/dL — ABNORMAL LOW (ref 12.0–15.0)
Immature Granulocytes: 0 %
Lymphocytes Relative: 29 %
Lymphs Abs: 1.1 10*3/uL (ref 0.7–4.0)
MCH: 24.7 pg — ABNORMAL LOW (ref 26.0–34.0)
MCHC: 30 g/dL (ref 30.0–36.0)
MCV: 82.4 fL (ref 80.0–100.0)
Monocytes Absolute: 0.4 10*3/uL (ref 0.1–1.0)
Monocytes Relative: 9 %
Neutro Abs: 2.3 10*3/uL (ref 1.7–7.7)
Neutrophils Relative %: 61 %
Platelets: 236 10*3/uL (ref 150–400)
RBC: 2.95 MIL/uL — ABNORMAL LOW (ref 3.87–5.11)
RDW: 14.5 % (ref 11.5–15.5)
WBC: 3.9 10*3/uL — ABNORMAL LOW (ref 4.0–10.5)
nRBC: 0 % (ref 0.0–0.2)

## 2021-07-19 LAB — IRON AND TIBC
Iron: 15 ug/dL — ABNORMAL LOW (ref 41–142)
Saturation Ratios: 4 % — ABNORMAL LOW (ref 21–57)
TIBC: 359 ug/dL (ref 236–444)
UIBC: 343 ug/dL (ref 120–384)

## 2021-07-19 LAB — TSH: TSH: 2.122 u[IU]/mL (ref 0.308–3.960)

## 2021-07-19 LAB — FERRITIN: Ferritin: 22 ng/mL (ref 11–307)

## 2021-07-19 LAB — RETICULOCYTES
Immature Retic Fract: 12.1 % (ref 2.3–15.9)
RBC.: 2.91 MIL/uL — ABNORMAL LOW (ref 3.87–5.11)
Retic Count, Absolute: 30 10*3/uL (ref 19.0–186.0)
Retic Ct Pct: 1 % (ref 0.4–3.1)

## 2021-07-19 LAB — CMP (CANCER CENTER ONLY)
ALT: 11 U/L (ref 0–44)
AST: 17 U/L (ref 15–41)
Albumin: 3.5 g/dL (ref 3.5–5.0)
Alkaline Phosphatase: 55 U/L (ref 38–126)
Anion gap: 9 (ref 5–15)
BUN: 29 mg/dL — ABNORMAL HIGH (ref 8–23)
CO2: 24 mmol/L (ref 22–32)
Calcium: 8.8 mg/dL — ABNORMAL LOW (ref 8.9–10.3)
Chloride: 107 mmol/L (ref 98–111)
Creatinine: 1.44 mg/dL — ABNORMAL HIGH (ref 0.44–1.00)
GFR, Estimated: 38 mL/min — ABNORMAL LOW (ref >60)
Glucose, Bld: 103 mg/dL — ABNORMAL HIGH (ref 70–99)
Potassium: 4.4 mmol/L (ref 3.5–5.1)
Sodium: 140 mmol/L (ref 135–145)
Total Bilirubin: 0.3 mg/dL (ref 0.3–1.2)
Total Protein: 7 g/dL (ref 6.5–8.1)

## 2021-07-19 LAB — VITAMIN B12: Vitamin B-12: 370 pg/mL (ref 180–914)

## 2021-07-19 NOTE — Progress Notes (Addendum)
HEMATOLOGY/ONCOLOGY CONSULTATION NOTE  Date of Service: 07/19/2021  Patient Care Team: [name], MD as PCP - General (Family Medicine)  CHIEF COMPLAINTS/PURPOSE OF CONSULTATION:  Anemia  HISTORY OF PRESENTING ILLNESS:   Amanda Frazier is a wonderful 75 y.o. female who has been referred to [place] by Dr .[name], MD for evaluation and management of Anemia.  Patient notes she has a history of hypertension, dyslipidemia, atrial fibrillation on Eliquis, history of gastric ulcer as a teenager and has been on Prilosec for more than 30 years. Patient notes that she got Covid in 02/2021 and had developed anemia and CHF and was put on iron .   In October Oct 2022 -- labs with her primary care physician showed hgb 8.3 ? GI bleeding no FOBT Dr Michail Sermon - EGD and COlonoscopy -- gastric polyp - bleeding..  Patient notes that Dr. Michail Sermon is trying to set her up for specialty referral to an academic center to help address her remaining gastric polyps.  Helicobacter pylori testing was negative. Patient notes no hematuria, no epistaxis, no gum bleeding. No blood in the urine.  No other acute new symptoms. Omeprazole >20 yrs which could also decrease absorption of iron from the food and oral replacement. Patient notes no melena or hematemesis .  Patient emotional fevers chills night sweats or unexpected weight loss.  MEDICAL HISTORY:  Past Medical History:  Diagnosis Date   Atrial fibrillation ([place])    Hyperlipidemia    Hypertension   Gastric polyps - bleednig Tubular adenoma - on recent colonoscopy Gastric ulcer as a teenager -- on prilosec >30 yrs Covid 02/2021-- CHF after COVID 19  SURGICAL HISTORY: Past Surgical History:  Procedure Laterality Date   BIOPSY  07/12/2021   Procedure: BIOPSY;  Surgeon: Wilford Corner, MD;  Location: [facility];  Service: Endoscopy;;   CARDIOVERSION  2021   COLONOSCOPY WITH PROPOFOL N/A 07/12/2021   Procedure: COLONOSCOPY WITH PROPOFOL;   Surgeon: Wilford Corner, MD;  Location: [facility];  Service: Endoscopy;  Laterality: N/A;   ESOPHAGOGASTRODUODENOSCOPY (EGD) WITH PROPOFOL N/A 07/12/2021   Procedure: ESOPHAGOGASTRODUODENOSCOPY (EGD) WITH PROPOFOL;  Surgeon: Wilford Corner, MD;  Location: [facility];  Service: Endoscopy;  Laterality: N/A;   POLYPECTOMY  07/12/2021   Procedure: POLYPECTOMY;  Surgeon: Wilford Corner, MD;  Location: [facility];  Service: Endoscopy;;   REPLACEMENT TOTAL KNEE Left 2015   SCLEROTHERAPY  07/12/2021   Procedure: SCLEROTHERAPY;  Surgeon: Wilford Corner, MD;  Location: [facility];  Service: Endoscopy;;   SUBMUCOSAL TATTOO INJECTION  07/12/2021   Procedure: SUBMUCOSAL TATTOO INJECTION;  Surgeon: Wilford Corner, MD;  Location: [facility];  Service: Endoscopy;;    SOCIAL HISTORY: Social History   Socioeconomic History   Marital status: Married    Spouse name: Not on file   Number of children: 2   Years of education: Not on file   Highest education level: Not on file  Occupational History   Not on file  Tobacco Use   Smoking status: Never   Smokeless tobacco: Never  Vaping Use   Vaping Use: Never used  Substance and Sexual Activity   Alcohol use: Never   Drug use: Never   Sexual activity: Not on file  Other Topics Concern   Not on file  Social History Narrative   Not on file   Social Determinants of Health   Financial Resource Strain: Not on file  Food Insecurity: Not on file  Transportation Needs: Not on file  Physical Activity: Not on file  Stress: Not on file  Social Connections: Not on file  Intimate Partner Violence: Not on file    FAMILY HISTORY: Family History  Problem Relation Age of Onset   Breast cancer Maternal Aunt    Atrial fibrillation Mother    Hyperlipidemia Mother    Lung cancer Mother    Atrial fibrillation Father    Colon cancer Father    Breast cancer Sister    Leukemia Brother     ALLERGIES:  is allergic to celecoxib,  fosamax [alendronate sodium], lipitor [atorvastatin calcium], valsartan-hydrochlorothiazide, benazepril, nickel, palladium chloride, simvastatin, and valsartan.  MEDICATIONS:  Current Outpatient Medications  Medication Sig Dispense Refill   amiodarone (PACERONE) 100 MG tablet Take 1 tablet (100 mg total) by mouth daily. 90 tablet 3   amLODipine (NORVASC) 5 MG tablet Take 2 tablets (10 mg total) by mouth daily. 180 tablet 3   calcium carbonate (TUMS - DOSED IN MG ELEMENTAL CALCIUM) 500 MG chewable tablet Chew 1 tablet by mouth 2 (two) times daily.     Cholecalciferol (VITAMIN D3) 125 MCG (5000 UT) CAPS Take 5,000 Units by mouth daily.     ELIQUIS 5 MG TABS tablet TAKE 1 TABLET BY MOUTH TWICE A DAY 60 tablet 6   Ferrous Sulfate (IRON) 325 (65 Fe) MG TABS 1 tablet     furosemide (LASIX) 20 MG tablet TAKE 1 TABLET (20 MG TOTAL) BY MOUTH AS NEEDED FOR FLUID OR EDEMA. 90 tablet 1   METAMUCIL FIBER PO Take 2 capsules by mouth daily as needed (regularity).     omeprazole (PRILOSEC OTC) 20 MG tablet Take 20 mg by mouth daily.     rosuvastatin (CRESTOR) 10 MG tablet Take 20 mg by mouth daily.     vitamin B-12 (CYANOCOBALAMIN) 1000 MCG tablet Take 1,000 mcg by mouth daily.     No current facility-administered medications for this visit.    REVIEW OF SYSTEMS:    10 Point review of Systems was done is negative except as noted above.  PHYSICAL EXAMINATION: ECOG PERFORMANCE STATUS:   . Vitals:   07/19/21 1326  BP: (!) 140/56  Pulse: 77  Resp: 17  Temp: 97.9 F (36.6 C)  SpO2: 100%   Filed Weights   07/19/21 1326  Weight: 185 lb 6.4 oz (84.1 kg)   .Body mass index is 32.84 kg/m.  GENERAL:alert, in no acute distress and comfortable SKIN: no acute rashes, no significant lesions EYES: conjunctiva are pink and non-injected, sclera anicteric OROPHARYNX: MMM, no exudates, no oropharyngeal erythema or ulceration NECK: supple, no JVD LYMPH:  no palpable lymphadenopathy in the cervical,  axillary or inguinal regions LUNGS: clear to auscultation b/l with normal respiratory effort HEART: regular rate & rhythm ABDOMEN:  normoactive bowel sounds , non tender, not distended. Extremity: no pedal edema PSYCH: alert & oriented x 3 with fluent speech NEURO: no focal motor/sensory deficits  LABORATORY DATA:  I have reviewed the data as listed  . CBC Latest Ref Rng & Units 07/19/2021 07/19/2021  WBC 4.0 - 10.5 K/uL 3.9(L) -  Hemoglobin 12.0 - 15.0 g/dL 7.3(L) -  Hematocrit 36.0 - 46.0 % 24.3(L) 25.0(L)  Platelets 150 - 400 K/uL 236 -    . CMP Latest Ref Rng & Units 07/19/2021 01/14/2021 09/02/2020  Glucose 70 - 99 mg/dL 103(H) 113(H) 97  BUN 8 - 23 mg/dL 29(H) 19 16  Creatinine 0.44 - 1.00 mg/dL 1.44(H) 1.34(H) 1.40(H)  Sodium 135 - 145 mmol/L 140 140 142  Potassium 3.5 - 5.1 mmol/L  4.4 4.7 4.5  Chloride 98 - 111 mmol/L 107 104 103  CO2 22 - 32 mmol/L '24 23 25  ' Calcium 8.9 - 10.3 mg/dL 8.8(L) 9.3 9.8  Total Protein 6.5 - 8.1 g/dL 7.0 - -  Total Bilirubin 0.3 - 1.2 mg/dL 0.3 - -  Alkaline Phos 38 - 126 U/L 55 - -  AST 15 - 41 U/L 17 - -  ALT 0 - 44 U/L 11 - -     RADIOGRAPHIC STUDIES: I have personally reviewed the radiological images as listed and agreed with the findings in the report. No results found.  ASSESSMENT & PLAN:   75 year old female with  #1 normocytic/microcytic Anemia Likely due to GI bleeding from gastric polyps in the context of being on anticoagulation . Patient also has been on chronic PPI therapy for more than 20 years which would reduce oral iron absorption from food.  #2 gastric polyps with a bleeding  #3 atrial fibrillation on Eliquis PLAN -Outside available labs reviewed with the patient and lab work-up ordered for evaluation of anemia as noted below. -Follow-up with Dr. Schooler/gastroenterology to address the remaining gastric polyps that could be a source of blood loss. -Patient is on Eliquis for her atrial fibrillation and will  continue to monitor and manage this with her primary care physician and cardiology. -We will consider IV iron replacement if iron deficient. -We will check antiintrinsic factor and antiparietal cell antibodies to rule out pernicious anemia.   Follow-up Labs today 1 visit with Dr. Irene Limbo in 1 week . Orders Placed This Encounter  Procedures   CBC with Differential/Platelet    Standing Status:   Future    Number of Occurrences:   1    Standing Expiration Date:   07/19/2022   CMP ([place] only)    Standing Status:   Future    Number of Occurrences:   1    Standing Expiration Date:   07/19/2022   Ferritin    Standing Status:   Future    Number of Occurrences:   1    Standing Expiration Date:   07/19/2022   Iron and TIBC    Standing Status:   Future    Number of Occurrences:   1    Standing Expiration Date:   07/19/2022   Vitamin B12    Standing Status:   Future    Number of Occurrences:   1    Standing Expiration Date:   07/19/2022   Reticulocytes    Standing Status:   Future    Number of Occurrences:   1    Standing Expiration Date:   07/19/2022   Folate RBC    Standing Status:   Future    Number of Occurrences:   1    Standing Expiration Date:   07/19/2022   Multiple Myeloma Panel (SPEP&IFE w/QIG)    Standing Status:   Future    Number of Occurrences:   1    Standing Expiration Date:   07/19/2022   TSH    Standing Status:   Future    Number of Occurrences:   1    Standing Expiration Date:   07/19/2022   Anti-parietal antibody    Standing Status:   Future    Number of Occurrences:   1    Standing Expiration Date:   07/19/2022   Intrinsic factor antibodies    Standing Status:   Future    Number of Occurrences:   1  Standing Expiration Date:   07/19/2022     all of the patients questions were answered with apparent satisfaction. The patient knows to call the clinic with any problems, questions or concerns.  I spent 35 mins counseling the patient face to  face. The total time spent in the appointment was 45 mins and more than 50% was on counseling and direct patient cares.    Sullivan Lone MD [place] [facility] Hematology/Oncology Physician [facility]  (Office):       [phone] (Work cell):  [phone] (Fax):           [phone]  07/19/2021 1:41 PM

## 2021-07-20 LAB — FOLATE RBC
Folate, Hemolysate: 399 ng/mL
Folate, RBC: 1596 ng/mL (ref >498)
Hematocrit: 25 % — ABNORMAL LOW (ref 34.0–46.6)

## 2021-07-20 LAB — ANTI-PARIETAL ANTIBODY: Parietal Cell Antibody-IgG: 1.6 Units (ref 0.0–20.0)

## 2021-07-20 LAB — INTRINSIC FACTOR ANTIBODIES: Intrinsic Factor: 1 AU/mL (ref 0.0–1.1)

## 2021-07-22 ENCOUNTER — Telehealth: Payer: Self-pay | Admitting: Hematology

## 2021-07-22 NOTE — Telephone Encounter (Signed)
Scheduled follow-up appointment per 11/22 los. Patient is aware. 

## 2021-07-25 ENCOUNTER — Other Ambulatory Visit: Payer: Self-pay | Admitting: Cardiology

## 2021-07-25 DIAGNOSIS — I480 Paroxysmal atrial fibrillation: Secondary | ICD-10-CM

## 2021-07-25 LAB — MULTIPLE MYELOMA PANEL, SERUM
Albumin SerPl Elph-Mcnc: 3.4 g/dL (ref 2.9–4.4)
Albumin/Glob SerPl: 1.1 (ref 0.7–1.7)
Alpha 1: 0.4 g/dL (ref 0.0–0.4)
Alpha2 Glob SerPl Elph-Mcnc: 0.7 g/dL (ref 0.4–1.0)
B-Globulin SerPl Elph-Mcnc: 1.1 g/dL (ref 0.7–1.3)
Gamma Glob SerPl Elph-Mcnc: 1 g/dL (ref 0.4–1.8)
Globulin, Total: 3.2 g/dL (ref 2.2–3.9)
IgA: 279 mg/dL (ref 64–422)
IgG (Immunoglobin G), Serum: 1125 mg/dL (ref 586–1602)
IgM (Immunoglobulin M), Srm: 95 mg/dL (ref 26–217)
Total Protein ELP: 6.6 g/dL (ref 6.0–8.5)

## 2021-07-26 ENCOUNTER — Other Ambulatory Visit: Payer: Self-pay

## 2021-07-26 ENCOUNTER — Inpatient Hospital Stay (HOSPITAL_BASED_OUTPATIENT_CLINIC_OR_DEPARTMENT_OTHER): Payer: Medicare HMO | Admitting: Hematology

## 2021-07-26 ENCOUNTER — Inpatient Hospital Stay: Payer: Medicare HMO

## 2021-07-26 DIAGNOSIS — D500 Iron deficiency anemia secondary to blood loss (chronic): Secondary | ICD-10-CM

## 2021-07-26 DIAGNOSIS — K922 Gastrointestinal hemorrhage, unspecified: Secondary | ICD-10-CM

## 2021-07-26 DIAGNOSIS — E538 Deficiency of other specified B group vitamins: Secondary | ICD-10-CM | POA: Diagnosis not present

## 2021-07-26 DIAGNOSIS — D649 Anemia, unspecified: Secondary | ICD-10-CM | POA: Diagnosis not present

## 2021-07-26 LAB — CBC WITH DIFFERENTIAL (CANCER CENTER ONLY)
Abs Immature Granulocytes: 0.01 10*3/uL (ref 0.00–0.07)
Basophils Absolute: 0 10*3/uL (ref 0.0–0.1)
Basophils Relative: 0 %
Eosinophils Absolute: 0 10*3/uL (ref 0.0–0.5)
Eosinophils Relative: 0 %
HCT: 23.9 % — ABNORMAL LOW (ref 36.0–46.0)
Hemoglobin: 7.1 g/dL — ABNORMAL LOW (ref 12.0–15.0)
Immature Granulocytes: 0 %
Lymphocytes Relative: 20 %
Lymphs Abs: 1 10*3/uL (ref 0.7–4.0)
MCH: 24.3 pg — ABNORMAL LOW (ref 26.0–34.0)
MCHC: 29.7 g/dL — ABNORMAL LOW (ref 30.0–36.0)
MCV: 81.8 fL (ref 80.0–100.0)
Monocytes Absolute: 0.5 10*3/uL (ref 0.1–1.0)
Monocytes Relative: 9 %
Neutro Abs: 3.5 10*3/uL (ref 1.7–7.7)
Neutrophils Relative %: 71 %
Platelet Count: 279 10*3/uL (ref 150–400)
RBC: 2.92 MIL/uL — ABNORMAL LOW (ref 3.87–5.11)
RDW: 14.4 % (ref 11.5–15.5)
WBC Count: 5 10*3/uL (ref 4.0–10.5)
nRBC: 0 % (ref 0.0–0.2)

## 2021-07-26 LAB — PREPARE RBC (CROSSMATCH)

## 2021-07-26 LAB — ABO/RH: ABO/RH(D): A POS

## 2021-07-26 LAB — SAMPLE TO BLOOD BANK

## 2021-07-27 ENCOUNTER — Other Ambulatory Visit: Payer: Self-pay | Admitting: Hematology

## 2021-07-27 ENCOUNTER — Inpatient Hospital Stay: Payer: Medicare HMO

## 2021-07-27 DIAGNOSIS — D500 Iron deficiency anemia secondary to blood loss (chronic): Secondary | ICD-10-CM

## 2021-07-27 DIAGNOSIS — D649 Anemia, unspecified: Secondary | ICD-10-CM | POA: Diagnosis not present

## 2021-07-27 MED ORDER — SODIUM CHLORIDE 0.9% IV SOLUTION
250.0000 mL | Freq: Once | INTRAVENOUS | Status: AC
  Administered 2021-07-27: 250 mL via INTRAVENOUS

## 2021-07-27 MED ORDER — ACETAMINOPHEN 325 MG PO TABS
650.0000 mg | ORAL_TABLET | Freq: Once | ORAL | Status: AC
  Administered 2021-07-27: 650 mg via ORAL
  Filled 2021-07-27: qty 2

## 2021-07-27 MED ORDER — METHYLPREDNISOLONE SODIUM SUCC 40 MG IJ SOLR
40.0000 mg | Freq: Once | INTRAMUSCULAR | Status: AC
  Administered 2021-07-27: 40 mg via INTRAVENOUS
  Filled 2021-07-27: qty 1

## 2021-07-27 NOTE — Patient Instructions (Signed)
Blood Transfusion, Adult A blood transfusion is a procedure in which you receive blood or a type of blood cell (blood component) through an IV. You may need a blood transfusion when your blood level is low. This may result from a bleeding disorder, illness, injury, or surgery. The blood may come from a donor. You may also be able to donate blood for yourself (autologous blood donation) before a planned surgery. The blood given in a transfusion is made up of different blood components. You may receive: Red blood cells. These carry oxygen to the cells in the body. Platelets. These help your blood to clot. Plasma. This is the liquid part of your blood. It carries proteins and other substances throughout the body. White blood cells. These help you fight infections. If you have hemophilia or another clotting disorder, you may also receive other types of blood products. Tell a health care provider about: Any blood disorders you have. Any previous reactions you have had during a blood transfusion. Any allergies you have. All medicines you are taking, including vitamins, herbs, eye drops, creams, and over-the-counter medicines. Any surgeries you have had. Any medical conditions you have, including any recent fever or cold symptoms. Whether you are pregnant or may be pregnant. What are the risks? Generally, this is a safe procedure. However, problems may occur. The most common problems include: A mild allergic reaction, such as red, swollen areas of skin (hives) and itching. Fever or chills. This may be the body's response to new blood cells received. This may occur during or up to 4 hours after the transfusion. More serious problems may include: Transfusion-associated circulatory overload (TACO), or too much fluid in the lungs. This may cause breathing problems. A serious allergic reaction, such as difficulty breathing or swelling around the face and lips. Transfusion-related acute lung injury  (TRALI), which causes breathing difficulty and low oxygen in the blood. This can occur within hours of the transfusion or several days later. Iron overload. This can happen after receiving many blood transfusions over a period of time. Infection or virus being transmitted. This is rare because donated blood is carefully tested before it is given. Hemolytic transfusion reaction. This is rare. It happens when your body's defense system (immune system)tries to attack the new blood cells. Symptoms may include fever, chills, nausea, low blood pressure, and low back or chest pain. Transfusion-associated graft-versus-host disease (TAGVHD). This is rare. It happens when donated cells attack your body's healthy tissues. What happens before the procedure? Medicines Ask your health care provider about: Changing or stopping your regular medicines. This is especially important if you are taking diabetes medicines or blood thinners. Taking medicines such as aspirin and ibuprofen. These medicines can thin your blood. Do not take these medicines unless your health care provider tells you to take them. Taking over-the-counter medicines, vitamins, herbs, and supplements. General instructions Follow instructions from your health care provider about eating and drinking restrictions. You will have a blood test to determine your blood type. This is necessary to know what kind of blood your body will accept and to match it to the donor blood. If you are going to have a planned surgery, you may be able to do an autologous blood donation. This may be done in case you need to have a transfusion. You will have your temperature, blood pressure, and pulse monitored before the transfusion. If you have had an allergic reaction to a transfusion in the past, you may be given medicine to help prevent   a reaction. This medicine may be given to you by mouth (orally) or through an IV. Set aside time for the blood transfusion. This  procedure generally takes 1-4 hours to complete. What happens during the procedure?  An IV will be inserted into one of your veins. The bag of donated blood will be attached to your IV. The blood will then enter through your vein. Your temperature, blood pressure, and pulse will be monitored regularly during the transfusion. This monitoring is done to detect early signs of a transfusion reaction. Tell your nurse right away if you have any of these symptoms during the transfusion: Shortness of breath or trouble breathing. Chest or back pain. Fever or chills. Hives or itching. If you have any signs or symptoms of a reaction, your transfusion will be stopped and you may be given medicine. When the transfusion is complete, your IV will be removed. Pressure may be applied to the IV site for a few minutes. A bandage (dressing)will be applied. The procedure may vary among health care providers and hospitals. What happens after the procedure? Your temperature, blood pressure, pulse, breathing rate, and blood oxygen level will be monitored until you leave the hospital or clinic. Your blood may be tested to see how you are responding to the transfusion. You may be warmed with fluids or blankets to maintain a normal body temperature. If you receive your blood transfusion in an outpatient setting, you will be told whom to contact to report any reactions. Where to find more information For more information on blood transfusions, visit the [employer]: redcross.org Summary A blood transfusion is a procedure in which you receive blood or a type of blood cell (blood component) through an IV. The blood you receive may come from a donor or be donated by yourself (autologous blood donation) before a planned surgery. The blood given in a transfusion is made up of different blood components. You may receive red blood cells, platelets, plasma, or white blood cells depending on the condition treated. Your  temperature, blood pressure, and pulse will be monitored before, during, and after the transfusion. After the transfusion, your blood may be tested to see how your body has responded. This information is not intended to replace advice given to you by your health care provider. Make sure you discuss any questions you have with your health care provider. Document Revised: 06/19/2019 Document Reviewed: 02/06/2019 Elsevier Patient Education  2022 [employer]  

## 2021-07-28 ENCOUNTER — Telehealth: Payer: Self-pay | Admitting: Hematology

## 2021-07-28 LAB — TYPE AND SCREEN
ABO/RH(D): A POS
Antibody Screen: NEGATIVE
Unit division: 0

## 2021-07-28 LAB — BPAM RBC
Blood Product Expiration Date: 202212142359
ISSUE DATE / TIME: 202211300855
Unit Type and Rh: 6200

## 2021-07-28 NOTE — Telephone Encounter (Signed)
Scheduled follow-up appointments per 11/29 los. Patient is aware. 

## 2021-08-01 NOTE — Progress Notes (Signed)
HEMATOLOGY/ONCOLOGY PHONE VISIT NOTE  Date of Service .07/26/2021   Patient Care Team: [name], MD as PCP - General (Family Medicine)  CHIEF COMPLAINTS/PURPOSE OF CONSULTATION:  Phone visit to discuss lab evaluation of Anemia  HISTORY OF PRESENTING ILLNESS:   .I connected with Amanda Frazier on 08/01/21 at 10:00 AM EST by telephone visit and verified that I am speaking with the correct person using two identifiers.   I discussed the limitations, risks, security and privacy concerns of performing an evaluation and management service by telemedicine and the availability of in-person appointments. I also discussed with the patient that there may be a patient responsible charge related to this service. The patient expressed understanding and agreed to proceed.   Other persons participating in the visit and their role in the encounter: None  Patient's location: Home Provider's location: [place]  Patient was called to discuss her lab results from evaluation done for her anemia. Patient notes no evidence of overt GI bleeding at this time.  No melena. Labs from 07/19/2021 CBC shows hemoglobin of 7.3 with an MCV of 82, WBC count of 3.9k and platelets of 236k. CMP shows stable creatinine of 1.44 otherwise unremarkable Ferritin of 22 with an iron saturation of 4% Reticulocyte count 1% with an absolute reticulocyte count of 30k which is low. 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  SURGICAL HISTORY: Past Surgical History:   Procedure Laterality Date   BIOPSY  07/12/2021   Procedure: BIOPSY;  Surgeon: Wilford Corner, MD;  Location: [facility];  Service: Endoscopy;;   CARDIOVERSION  2021   COLONOSCOPY WITH PROPOFOL N/A 07/12/2021   Procedure: COLONOSCOPY WITH PROPOFOL;  Surgeon: Wilford Corner, MD;  Location: [facility];  Service: Endoscopy;  Laterality: N/A;   ESOPHAGOGASTRODUODENOSCOPY (EGD) WITH PROPOFOL N/A 07/12/2021   Procedure: ESOPHAGOGASTRODUODENOSCOPY (EGD) WITH PROPOFOL;  Surgeon: Wilford Corner, MD;  Location[facility];  Service: Endoscopy;  Laterality: N/A;   POLYPECTOMY  07/12/2021   Procedure: POLYPECTOMY;  Surgeon: Wilford Corner, MD;  Location: [facility];  Service: Endoscopy;;   REPLACEMENT TOTAL KNEE Left 2015   SCLEROTHERAPY  07/12/2021   Procedure: SCLEROTHERAPY;  Surgeon: Wilford Corner, MD;  [facility];  Service: Endoscopy;;   SUBMUCOSAL TATTOO INJECTION  07/12/2021   Procedure: SUBMUCOSAL TATTOO INJECTION;  Surgeon: Wilford Corner, MD;  [facility];  Service: Endoscopy;;    SOCIAL HISTORY: Social History   Socioeconomic History   Marital status: Married    Spouse name: Not on file   Number of children: 2   Years of education: Not on file   Highest education level: Not on file  Occupational History   Not on file  Tobacco Use   Smoking status: Never   Smokeless tobacco: Never  Vaping Use   Vaping Use: Never used  Substance and Sexual Activity   Alcohol use: Never   Drug use: Never   Sexual activity: Not on file  Other Topics Concern   Not on file  Social History Narrative   Not on file   Social  Determinants of Health   Financial Resource Strain: Not on file  Food Insecurity: Not on file  Transportation Needs: Not on file  Physical Activity: Not on file  Stress: Not on file  Social Connections: Not on file  Intimate Partner Violence: Not on file    FAMILY HISTORY: Family History  Problem Relation Age of Onset    Breast cancer Maternal Aunt    Atrial fibrillation Mother    Hyperlipidemia Mother    Lung cancer Mother    Atrial fibrillation Father    Colon cancer Father    Breast cancer Sister    Leukemia Brother     ALLERGIES:  is allergic to celecoxib, fosamax [alendronate sodium], lipitor [atorvastatin calcium], valsartan-hydrochlorothiazide, benazepril, nickel, palladium chloride, simvastatin, and valsartan.  MEDICATIONS:  Current Outpatient Medications  Medication Sig Dispense Refill   amiodarone (PACERONE) 100 MG tablet Take 1 tablet (100 mg total) by mouth daily. 90 tablet 3   amLODipine (NORVASC) 5 MG tablet Take 2 tablets (10 mg total) by mouth daily. 180 tablet 3   calcium carbonate (TUMS - DOSED IN MG ELEMENTAL CALCIUM) 500 MG chewable tablet Chew 1 tablet by mouth 2 (two) times daily.     Cholecalciferol (VITAMIN D3) 125 MCG (5000 UT) CAPS Take 5,000 Units by mouth daily.     ELIQUIS 5 MG TABS tablet TAKE 1 TABLET BY MOUTH TWICE A DAY 60 tablet 6   Ferrous Sulfate (IRON) 325 (65 Fe) MG TABS 1 tablet     furosemide (LASIX) 20 MG tablet TAKE 1 TABLET (20 MG TOTAL) BY MOUTH AS NEEDED FOR FLUID OR EDEMA. 90 tablet 1   METAMUCIL FIBER PO Take 2 capsules by mouth daily as needed (regularity).     omeprazole (PRILOSEC OTC) 20 MG tablet Take 20 mg by mouth daily.     rosuvastatin (CRESTOR) 10 MG tablet Take 20 mg by mouth daily.     vitamin B-12 (CYANOCOBALAMIN) 1000 MCG tablet Take 1,000 mcg by mouth daily.     No current facility-administered medications for this visit.    REVIEW OF SYSTEMS:    10 Point review of Systems was done is negative except as noted above.  PHYSICAL EXAMINATION: Telemedicine visit  LABORATORY DATA:  . CBC Latest Ref Rng & Units 07/19/2021 07/19/2021  WBC 4.0 - 10.5 K/uL 3.9(L) -  Hemoglobin 12.0 - 15.0 g/dL 7.3(L) -  Hematocrit 36.0 - 46.0 % 24.3(L) 25.0(L)  Platelets 150 - 400 K/uL 236 -   . CMP Latest Ref Rng & Units 07/19/2021 01/14/2021 09/02/2020   Glucose 70 - 99 mg/dL 103(H) 113(H) 97  BUN 8 - 23 mg/dL 29(H) 19 16  Creatinine 0.44 - 1.00 mg/dL 1.44(H) 1.34(H) 1.40(H)  Sodium 135 - 145 mmol/L 140 140 142  Potassium 3.5 - 5.1 mmol/L 4.4 4.7 4.5  Chloride 98 - 111 mmol/L 107 104 103  CO2 22 - 32 mmol/L 24 23 25   Calcium 8.9 - 10.3 mg/dL 8.8(L) 9.3 9.8  Total Protein 6.5 - 8.1 g/dL 7.0 - -  Total Bilirubin 0.3 - 1.2 mg/dL 0.3 - -  Alkaline Phos 38 - 126 U/L 55 - -  AST 15 - 41 U/L 17 - -  ALT 0 - 44 U/L 11 - -   . Lab Results  Component Value Date   IRON 15 (L) 07/19/2021   TIBC 359 07/19/2021   IRONPCTSAT 4 (L) 07/19/2021   (Iron and TIBC)  Lab Results  Component Value Date   FERRITIN 22 07/19/2021  B12 -- 370  Component     Latest Ref Rng & Units 07/19/2021  IgG (Immunoglobin G), Serum     586 - 1,602 mg/dL 1,125  IgA     64 - 422 mg/dL 279  IgM (Immunoglobulin M), Srm     26 - 217 mg/dL 95  Total Protein ELP     6.0 - 8.5 g/dL 6.6  Albumin SerPl Elph-Mcnc     2.9 - 4.4 g/dL 3.4  Alpha 1     0.0 - 0.4 g/dL 0.4  Alpha2 Glob SerPl Elph-Mcnc     0.4 - 1.0 g/dL 0.7  B-Globulin SerPl Elph-Mcnc     0.7 - 1.3 g/dL 1.1  Gamma Glob SerPl Elph-Mcnc     0.4 - 1.8 g/dL 1.0  M Protein SerPl Elph-Mcnc     Not Observed g/dL Not Observed  Globulin, Total     2.2 - 3.9 g/dL 3.2  Albumin/Glob SerPl     0.7 - 1.7 1.1  IFE 1      Comment  Please Note (HCV):      Comment  Retic Ct Pct     0.4 - 3.1 % 1.0  RBC.     3.87 - 5.11 MIL/uL 2.91 (L)  Retic Count, Absolute     19.0 - 186.0 K/uL 30.0  Immature Retic Fract     2.3 - 15.9 % 12.1  Folate, Hemolysate     Not Estab. ng/mL 399.0  HCT     34.0 - 46.6 % 25.0 (L)  Folate, RBC     >498 ng/mL 1,596  Intrinsic Factor     0.0 - 1.1 AU/mL 1.0  Parietal Cell Antibody-IgG     0.0 - 20.0 Units 1.6  TSH     0.308 - 3.960 uIU/mL 2.122  Vitamin B12     180 - 914 pg/mL 370      RADIOGRAPHIC STUDIES: I have personally reviewed the radiological images as  listed and agreed with the findings in the report. No results found.  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 Patient also has been on chronic PPI therapy for more than 20 years which would reduce oral iron absorption from food.  #3 gastric polyps with a bleeding  #4 atrial fibrillation on Eliquis PLAN -Lab results were discussed in details which showed -labs from 07/19/2021 CBC shows hemoglobin of 7.3 with an MCV of 82, WBC count of 3.9k and platelets of 236k. CMP shows stable creatinine of 1.44 otherwise unremarkable Ferritin of 22 with an iron saturation of 4% Reticulocyte count 1% with an absolute reticulocyte count of 30k which is low. B12 370 RBC folate within normal limits Myeloma panel shows no M spike TSH 2.12 with a normal limits Antiparietal cell and antiintrinsic factor antibodies negative ruling out pernicious anemia. -Discussed that the patient's hemoglobin is low enough and she is symptomatic enough that we would recommend 1 unit of PRBC as soon as possible. -We shall set her up for IV Injectafer weekly x2 doses as soon as possible -orders placed -Patient to continue her vitamin B complex to support accelerated hematopoiesis as well as her B12 -May stop oral iron once she receives IV iron. -She was recommended to call and follow-up with Dr. Schooler/gastroenterology regarding plan to manage her gastric polyps and anticoagulation. -Patient is on  Eliquis for her atrial fibrillation and will continue to monitor and manage this with her primary care physician and cardiology and will connect with her primary care physician regarding recommendations about managing her Eliquis in the context of GI bleeding from polyps.  Follow-up PRBC transfusion 1 unit ASAP IV Injectafer weekly x 2 doses  ASAP Labs and 1 unit of PRBC if needed in 3 weeks RTC with Dr Irene Limbo with labs in 4 weeks    all of the patients questions were answered with apparent satisfaction. The patient knows to call the clinic with any problems, questions or concerns.  . The total time spent in the appointment was 20 minutes in discussing lab results PRBC transfusions and iron infusions.  Sullivan Lone MD [place] AAHIVMS [facility] Hematology/Oncology Physician [facility]

## 2021-08-03 ENCOUNTER — Other Ambulatory Visit: Payer: Self-pay | Admitting: Hematology

## 2021-08-04 ENCOUNTER — Telehealth: Payer: Self-pay | Admitting: Hematology

## 2021-08-04 NOTE — Telephone Encounter (Signed)
Scheduled per sch msg. Called and spoke with patient. Confirmed appt  

## 2021-08-05 ENCOUNTER — Inpatient Hospital Stay: Payer: Medicare HMO | Attending: Hematology

## 2021-08-05 ENCOUNTER — Other Ambulatory Visit: Payer: Self-pay

## 2021-08-05 VITALS — BP 139/55 | HR 58 | Temp 98.2°F | Resp 16

## 2021-08-05 DIAGNOSIS — D500 Iron deficiency anemia secondary to blood loss (chronic): Secondary | ICD-10-CM

## 2021-08-05 DIAGNOSIS — D509 Iron deficiency anemia, unspecified: Secondary | ICD-10-CM | POA: Diagnosis not present

## 2021-08-05 MED ORDER — LORATADINE 10 MG PO TABS
10.0000 mg | ORAL_TABLET | Freq: Once | ORAL | Status: AC
  Administered 2021-08-05: 10 mg via ORAL
  Filled 2021-08-05: qty 1

## 2021-08-05 MED ORDER — ACETAMINOPHEN 325 MG PO TABS
650.0000 mg | ORAL_TABLET | Freq: Once | ORAL | Status: AC
  Administered 2021-08-05: 650 mg via ORAL
  Filled 2021-08-05: qty 2

## 2021-08-05 MED ORDER — SODIUM CHLORIDE 0.9 % IV SOLN: Freq: Once | INTRAVENOUS | Status: AC

## 2021-08-05 MED ORDER — SODIUM CHLORIDE 0.9 % IV SOLN
300.0000 mg | Freq: Once | INTRAVENOUS | Status: AC
  Administered 2021-08-05: 300 mg via INTRAVENOUS
  Filled 2021-08-05: qty 300

## 2021-08-05 NOTE — Patient Instructions (Signed)

## 2021-08-09 ENCOUNTER — Telehealth: Payer: Self-pay

## 2021-08-09 LAB — LIPID PANEL WITH LDL/HDL RATIO
Cholesterol, Total: 160 mg/dL (ref 100–199)
HDL: 58 mg/dL (ref >39)
LDL Chol Calc (NIH): 85 mg/dL (ref 0–99)
LDL/HDL Ratio: 1.5 ratio (ref 0.0–3.2)
Triglycerides: 93 mg/dL (ref 0–149)
VLDL Cholesterol Cal: 17 mg/dL (ref 5–40)

## 2021-08-09 NOTE — Telephone Encounter (Signed)
Pre-op clearance for Bleeding Polyps on 08/15/21.  Clearance to STOP Eliquis for 48hrs prior to procedure on 08/19/21.  Date of procedure: 08/19/21 Procedure: Endoscopy Physician : Dr. Rachell Cipro @ [facility]  Please advise.

## 2021-08-10 NOTE — Telephone Encounter (Signed)
Cardiac risk stratification has been sent for endoscopy.

## 2021-08-10 NOTE — Telephone Encounter (Signed)
Called to inform patient, NA, no VM box to leave message

## 2021-08-11 NOTE — Telephone Encounter (Signed)
Patient is aware that her request for Cardiac risk stratification has been sent for endoscopy.

## 2021-08-12 ENCOUNTER — Inpatient Hospital Stay: Payer: Medicare HMO

## 2021-08-12 ENCOUNTER — Other Ambulatory Visit: Payer: Self-pay

## 2021-08-12 VITALS — BP 148/50 | HR 61 | Temp 98.0°F | Resp 18

## 2021-08-12 DIAGNOSIS — D500 Iron deficiency anemia secondary to blood loss (chronic): Secondary | ICD-10-CM

## 2021-08-12 DIAGNOSIS — D509 Iron deficiency anemia, unspecified: Secondary | ICD-10-CM | POA: Diagnosis not present

## 2021-08-12 MED ORDER — SODIUM CHLORIDE 0.9 % IV SOLN: Freq: Once | INTRAVENOUS | Status: AC

## 2021-08-12 MED ORDER — ACETAMINOPHEN 325 MG PO TABS
650.0000 mg | ORAL_TABLET | Freq: Once | ORAL | Status: AC
  Administered 2021-08-12: 650 mg via ORAL

## 2021-08-12 MED ORDER — LORATADINE 10 MG PO TABS
10.0000 mg | ORAL_TABLET | Freq: Once | ORAL | Status: AC
  Administered 2021-08-12: 10 mg via ORAL

## 2021-08-12 MED ORDER — SODIUM CHLORIDE 0.9 % IV SOLN
300.0000 mg | Freq: Once | INTRAVENOUS | Status: AC
  Administered 2021-08-12: 300 mg via INTRAVENOUS
  Filled 2021-08-12: qty 300

## 2021-08-12 NOTE — Patient Instructions (Signed)

## 2021-08-12 NOTE — Progress Notes (Signed)
Patient waited 30 minutes post iron observation. No issues. Vss upon discharge.

## 2021-08-15 ENCOUNTER — Other Ambulatory Visit: Payer: Self-pay

## 2021-08-15 DIAGNOSIS — N1831 Chronic kidney disease, stage 3a: Secondary | ICD-10-CM | POA: Insufficient documentation

## 2021-08-15 DIAGNOSIS — I509 Heart failure, unspecified: Secondary | ICD-10-CM | POA: Insufficient documentation

## 2021-08-15 DIAGNOSIS — I272 Pulmonary hypertension, unspecified: Secondary | ICD-10-CM | POA: Insufficient documentation

## 2021-08-15 DIAGNOSIS — D500 Iron deficiency anemia secondary to blood loss (chronic): Secondary | ICD-10-CM

## 2021-08-15 DIAGNOSIS — D649 Anemia, unspecified: Secondary | ICD-10-CM

## 2021-08-16 ENCOUNTER — Inpatient Hospital Stay: Payer: Medicare HMO

## 2021-08-16 ENCOUNTER — Other Ambulatory Visit: Payer: Self-pay

## 2021-08-16 DIAGNOSIS — D649 Anemia, unspecified: Secondary | ICD-10-CM

## 2021-08-16 DIAGNOSIS — D509 Iron deficiency anemia, unspecified: Secondary | ICD-10-CM | POA: Diagnosis not present

## 2021-08-16 DIAGNOSIS — D500 Iron deficiency anemia secondary to blood loss (chronic): Secondary | ICD-10-CM

## 2021-08-16 LAB — CMP (CANCER CENTER ONLY)
ALT: 15 U/L (ref 0–44)
AST: 23 U/L (ref 15–41)
Albumin: 3.7 g/dL (ref 3.5–5.0)
Alkaline Phosphatase: 53 U/L (ref 38–126)
Anion gap: 6 (ref 5–15)
BUN: 27 mg/dL — ABNORMAL HIGH (ref 8–23)
CO2: 26 mmol/L (ref 22–32)
Calcium: 9.2 mg/dL (ref 8.9–10.3)
Chloride: 108 mmol/L (ref 98–111)
Creatinine: 1.3 mg/dL — ABNORMAL HIGH (ref 0.44–1.00)
GFR, Estimated: 43 mL/min — ABNORMAL LOW (ref >60)
Glucose, Bld: 127 mg/dL — ABNORMAL HIGH (ref 70–99)
Potassium: 4.2 mmol/L (ref 3.5–5.1)
Sodium: 140 mmol/L (ref 135–145)
Total Bilirubin: 0.5 mg/dL (ref 0.3–1.2)
Total Protein: 6.4 g/dL — ABNORMAL LOW (ref 6.5–8.1)

## 2021-08-16 LAB — CBC WITH DIFFERENTIAL (CANCER CENTER ONLY)
Abs Immature Granulocytes: 0.01 10*3/uL (ref 0.00–0.07)
Basophils Absolute: 0 10*3/uL (ref 0.0–0.1)
Basophils Relative: 0 %
Eosinophils Absolute: 0.1 10*3/uL (ref 0.0–0.5)
Eosinophils Relative: 2 %
HCT: 27.8 % — ABNORMAL LOW (ref 36.0–46.0)
Hemoglobin: 8.5 g/dL — ABNORMAL LOW (ref 12.0–15.0)
Immature Granulocytes: 0 %
Lymphocytes Relative: 25 %
Lymphs Abs: 1.2 10*3/uL (ref 0.7–4.0)
MCH: 24.9 pg — ABNORMAL LOW (ref 26.0–34.0)
MCHC: 30.6 g/dL (ref 30.0–36.0)
MCV: 81.5 fL (ref 80.0–100.0)
Monocytes Absolute: 0.4 10*3/uL (ref 0.1–1.0)
Monocytes Relative: 10 %
Neutro Abs: 2.9 10*3/uL (ref 1.7–7.7)
Neutrophils Relative %: 63 %
Platelet Count: 224 10*3/uL (ref 150–400)
RBC: 3.41 MIL/uL — ABNORMAL LOW (ref 3.87–5.11)
RDW: 16.4 % — ABNORMAL HIGH (ref 11.5–15.5)
WBC Count: 4.6 10*3/uL (ref 4.0–10.5)
nRBC: 0 % (ref 0.0–0.2)

## 2021-08-16 LAB — SAMPLE TO BLOOD BANK

## 2021-08-18 ENCOUNTER — Encounter: Payer: Self-pay | Admitting: Hematology

## 2021-08-20 ENCOUNTER — Other Ambulatory Visit: Payer: Self-pay

## 2021-08-20 ENCOUNTER — Inpatient Hospital Stay: Payer: Medicare HMO

## 2021-08-20 VITALS — BP 134/43 | HR 53 | Temp 98.2°F | Resp 16

## 2021-08-20 DIAGNOSIS — D509 Iron deficiency anemia, unspecified: Secondary | ICD-10-CM | POA: Diagnosis not present

## 2021-08-20 DIAGNOSIS — D500 Iron deficiency anemia secondary to blood loss (chronic): Secondary | ICD-10-CM

## 2021-08-20 MED ORDER — SODIUM CHLORIDE 0.9 % IV SOLN: Freq: Once | INTRAVENOUS | Status: AC

## 2021-08-20 MED ORDER — SODIUM CHLORIDE 0.9 % IV SOLN
300.0000 mg | Freq: Once | INTRAVENOUS | Status: AC
  Administered 2021-08-20: 10:00:00 300 mg via INTRAVENOUS
  Filled 2021-08-20: qty 300

## 2021-08-20 MED ORDER — LORATADINE 10 MG PO TABS
10.0000 mg | ORAL_TABLET | Freq: Once | ORAL | Status: AC
  Administered 2021-08-20: 10 mg via ORAL

## 2021-08-20 MED ORDER — ACETAMINOPHEN 325 MG PO TABS
650.0000 mg | ORAL_TABLET | Freq: Once | ORAL | Status: AC
  Administered 2021-08-20: 10:00:00 650 mg via ORAL

## 2021-08-20 NOTE — Patient Instructions (Signed)

## 2021-08-20 NOTE — Progress Notes (Signed)
Patient declined to stay for 30 minute observation. Vitals stable and patient in no distress upon leaving infusion room.

## 2021-08-26 ENCOUNTER — Other Ambulatory Visit: Payer: Self-pay

## 2021-08-26 DIAGNOSIS — D500 Iron deficiency anemia secondary to blood loss (chronic): Secondary | ICD-10-CM

## 2021-08-27 ENCOUNTER — Other Ambulatory Visit: Payer: Self-pay

## 2021-08-27 ENCOUNTER — Inpatient Hospital Stay: Payer: Medicare HMO

## 2021-08-27 VITALS — BP 140/54 | HR 56 | Temp 98.2°F | Resp 16

## 2021-08-27 DIAGNOSIS — D509 Iron deficiency anemia, unspecified: Secondary | ICD-10-CM | POA: Diagnosis not present

## 2021-08-27 DIAGNOSIS — D500 Iron deficiency anemia secondary to blood loss (chronic): Secondary | ICD-10-CM

## 2021-08-27 MED ORDER — SODIUM CHLORIDE 0.9 % IV SOLN: Freq: Once | INTRAVENOUS | Status: AC

## 2021-08-27 MED ORDER — LORATADINE 10 MG PO TABS
10.0000 mg | ORAL_TABLET | Freq: Once | ORAL | Status: AC
  Administered 2021-08-27: 10 mg via ORAL
  Filled 2021-08-27: qty 1

## 2021-08-27 MED ORDER — ACETAMINOPHEN 325 MG PO TABS
650.0000 mg | ORAL_TABLET | Freq: Once | ORAL | Status: AC
  Administered 2021-08-27: 650 mg via ORAL
  Filled 2021-08-27: qty 2

## 2021-08-27 MED ORDER — SODIUM CHLORIDE 0.9 % IV SOLN
300.0000 mg | Freq: Once | INTRAVENOUS | Status: AC
  Administered 2021-08-27: 300 mg via INTRAVENOUS
  Filled 2021-08-27: qty 300

## 2021-08-27 NOTE — Patient Instructions (Signed)

## 2021-08-27 NOTE — Progress Notes (Signed)
Pt declined 30 minute post Venofer infusion observation. Pt tolerated treatment well without incident. VSS at discharge.  Ambulatory to lobby.

## 2021-08-30 ENCOUNTER — Telehealth: Payer: Self-pay | Admitting: Cardiology

## 2021-08-30 ENCOUNTER — Inpatient Hospital Stay: Payer: Medicare HMO

## 2021-08-30 ENCOUNTER — Inpatient Hospital Stay: Payer: Medicare HMO | Attending: Hematology | Admitting: Hematology

## 2021-08-30 ENCOUNTER — Other Ambulatory Visit: Payer: Self-pay

## 2021-08-30 VITALS — BP 146/45 | HR 64 | Temp 97.7°F | Resp 17 | Ht 63.0 in | Wt 180.2 lb

## 2021-08-30 DIAGNOSIS — Z7901 Long term (current) use of anticoagulants: Secondary | ICD-10-CM | POA: Insufficient documentation

## 2021-08-30 DIAGNOSIS — Z8616 Personal history of COVID-19: Secondary | ICD-10-CM | POA: Insufficient documentation

## 2021-08-30 DIAGNOSIS — Z79899 Other long term (current) drug therapy: Secondary | ICD-10-CM | POA: Diagnosis not present

## 2021-08-30 DIAGNOSIS — D500 Iron deficiency anemia secondary to blood loss (chronic): Secondary | ICD-10-CM | POA: Diagnosis not present

## 2021-08-30 DIAGNOSIS — I110 Hypertensive heart disease with heart failure: Secondary | ICD-10-CM | POA: Diagnosis not present

## 2021-08-30 DIAGNOSIS — D509 Iron deficiency anemia, unspecified: Secondary | ICD-10-CM | POA: Insufficient documentation

## 2021-08-30 DIAGNOSIS — I509 Heart failure, unspecified: Secondary | ICD-10-CM | POA: Insufficient documentation

## 2021-08-30 LAB — CMP (CANCER CENTER ONLY)
ALT: 10 U/L (ref 0–44)
AST: 17 U/L (ref 15–41)
Albumin: 3.9 g/dL (ref 3.5–5.0)
Alkaline Phosphatase: 52 U/L (ref 38–126)
Anion gap: 4 — ABNORMAL LOW (ref 5–15)
BUN: 24 mg/dL — ABNORMAL HIGH (ref 8–23)
CO2: 27 mmol/L (ref 22–32)
Calcium: 9.3 mg/dL (ref 8.9–10.3)
Chloride: 109 mmol/L (ref 98–111)
Creatinine: 1.3 mg/dL — ABNORMAL HIGH (ref 0.44–1.00)
GFR, Estimated: 43 mL/min — ABNORMAL LOW (ref >60)
Glucose, Bld: 91 mg/dL (ref 70–99)
Potassium: 4.1 mmol/L (ref 3.5–5.1)
Sodium: 140 mmol/L (ref 135–145)
Total Bilirubin: 0.8 mg/dL (ref 0.3–1.2)
Total Protein: 6.8 g/dL (ref 6.5–8.1)

## 2021-08-30 LAB — IRON AND IRON BINDING CAPACITY (CC-WL,HP ONLY)
Iron: 42 ug/dL (ref 28–170)
Saturation Ratios: 14 % (ref 10.4–31.8)
TIBC: 305 ug/dL (ref 250–450)
UIBC: 263 ug/dL (ref 148–442)

## 2021-08-30 LAB — CBC WITH DIFFERENTIAL (CANCER CENTER ONLY)
Abs Immature Granulocytes: 0 K/uL (ref 0.00–0.07)
Basophils Absolute: 0 K/uL (ref 0.0–0.1)
Basophils Relative: 0 %
Eosinophils Absolute: 0 K/uL (ref 0.0–0.5)
Eosinophils Relative: 1 %
HCT: 31.9 % — ABNORMAL LOW (ref 36.0–46.0)
Hemoglobin: 9.8 g/dL — ABNORMAL LOW (ref 12.0–15.0)
Immature Granulocytes: 0 %
Lymphocytes Relative: 21 %
Lymphs Abs: 0.9 K/uL (ref 0.7–4.0)
MCH: 25.9 pg — ABNORMAL LOW (ref 26.0–34.0)
MCHC: 30.7 g/dL (ref 30.0–36.0)
MCV: 84.2 fL (ref 80.0–100.0)
Monocytes Absolute: 0.3 K/uL (ref 0.1–1.0)
Monocytes Relative: 8 %
Neutro Abs: 2.8 K/uL (ref 1.7–7.7)
Neutrophils Relative %: 70 %
Platelet Count: 205 K/uL (ref 150–400)
RBC: 3.79 MIL/uL — ABNORMAL LOW (ref 3.87–5.11)
RDW: 19.2 % — ABNORMAL HIGH (ref 11.5–15.5)
WBC Count: 4.1 K/uL (ref 4.0–10.5)
nRBC: 0 % (ref 0.0–0.2)

## 2021-08-30 LAB — SAMPLE TO BLOOD BANK

## 2021-08-30 LAB — FERRITIN: Ferritin: 395 ng/mL — ABNORMAL HIGH (ref 11–307)

## 2021-08-30 NOTE — Telephone Encounter (Signed)
Patient says Anderson Malta wanted her to take rosuvastatin 10 MG 2x per day until it ran out, then start doing 20 MG instead. She says her pharmacy won't fill it right now, so she needs a new prescription for 20 MG.

## 2021-08-31 ENCOUNTER — Other Ambulatory Visit: Payer: Self-pay

## 2021-08-31 MED ORDER — ROSUVASTATIN CALCIUM 20 MG PO TABS: 20.0000 mg | ORAL_TABLET | Freq: Every day | ORAL | 1 refills | Status: DC

## 2021-08-31 NOTE — Telephone Encounter (Signed)
error 

## 2021-08-31 NOTE — Telephone Encounter (Signed)
sent 

## 2021-09-02 ENCOUNTER — Telehealth: Payer: Self-pay | Admitting: Hematology

## 2021-09-02 NOTE — Telephone Encounter (Signed)
Left message with follow-up appointments per 1/3 los.

## 2021-09-05 ENCOUNTER — Encounter: Payer: Self-pay | Admitting: Hematology

## 2021-09-05 NOTE — Progress Notes (Signed)
HEMATOLOGY/ONCOLOGY CLINIC VISIT NOTE  Date of Service .08/30/2021   Patient Care Team: [name], MD as PCP - General (Family Medicine)  CHIEF COMPLAINTS/PURPOSE OF CONSULTATION:  Follow-up for iron deficiency anemia  HISTORY OF PRESENTING ILLNESS:   Amanda Frazier is here for follow-up of her iron deficiency anemia.  She notes she tolerated her 2 doses of IV Injectafer without any toxicities.  She was seen by gastro[facility] and notes that she had her remaining gastric polyps resected.  She notes that the procedure went well and her upper abdominal discomfort has resolved and she has not noted any further melena or hematochezia.  She notes that she has follow-up with her gastroenterologist for repeat interval EGD. She notes improving energy level.  Hemoglobin is up from 7.1 to 9.8.  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  SURGICAL HISTORY: Past Surgical History:  Procedure Laterality Date   BIOPSY  07/12/2021   Procedure: BIOPSY;  Surgeon: Wilford Corner, MD;  [facility];  Service: Endoscopy;;   CARDIOVERSION  2021   COLONOSCOPY WITH PROPOFOL N/A 07/12/2021   Procedure: COLONOSCOPY WITH PROPOFOL;  Surgeon: Wilford Corner, MD;  [facility];  Service: Endoscopy;  Laterality: N/A;   ESOPHAGOGASTRODUODENOSCOPY (EGD) WITH PROPOFOL N/A 07/12/2021   Procedure: ESOPHAGOGASTRODUODENOSCOPY (EGD) WITH PROPOFOL;  Surgeon: Wilford Corner, MD;  [facility];  Service: Endoscopy;  Laterality: N/A;   POLYPECTOMY  07/12/2021   Procedure: POLYPECTOMY;  Surgeon: Wilford Corner, MD;  [facility];  Service: Endoscopy;;   REPLACEMENT TOTAL KNEE Left 2015    SCLEROTHERAPY  07/12/2021   Procedure: SCLEROTHERAPY;  Surgeon: Wilford Corner, MD;  [facility];  Service: Endoscopy;;   SUBMUCOSAL TATTOO INJECTION  07/12/2021   Procedure: SUBMUCOSAL TATTOO INJECTION;  Surgeon: Wilford Corner, MD;  [facility];  Service: Endoscopy;;    SOCIAL HISTORY: Social History   Socioeconomic History   Marital status: Married    Spouse name: Not on file   Number of children: 2   Years of education: Not on file   Highest education level: Not on file  Occupational History   Not on file  Tobacco Use   Smoking status: Never   Smokeless tobacco: Never  Vaping Use   Vaping Use: Never used  Substance and Sexual Activity   Alcohol use: Never   Drug use: Never   Sexual activity: Not on file  Other Topics Concern   Not on file  Social History Narrative   Not on file   Social Determinants of Health   Financial Resource Strain: Not on file  Food Insecurity: Not on file  Transportation Needs: Not on file  Physical Activity: Not on file  Stress: Not on file  Social Connections: Not on file  Intimate Partner Violence: Not on file    FAMILY HISTORY: Family History  Problem Relation Age of Onset   Breast cancer Maternal Aunt    Atrial fibrillation Mother    Hyperlipidemia Mother    Lung cancer Mother    Atrial fibrillation Father    Colon cancer Father    Breast cancer Sister    Leukemia Brother     ALLERGIES:  is allergic to celecoxib, fosamax [alendronate sodium],  lipitor [atorvastatin calcium], valsartan-hydrochlorothiazide, benazepril, nickel, palladium chloride, simvastatin, and valsartan.  MEDICATIONS:  Current Outpatient Medications  Medication Sig Dispense Refill   amiodarone (PACERONE) 100 MG tablet Take 1 tablet (100 mg total) by mouth daily. 90 tablet 3   amLODipine (NORVASC) 5 MG tablet Take 2 tablets (10 mg total) by mouth daily. 180 tablet 3   B Complex Vitamins (B-COMPLEX/B-12) LIQD Place under the  tongue.     calcium carbonate (TUMS - DOSED IN MG ELEMENTAL CALCIUM) 500 MG chewable tablet Chew 1 tablet by mouth 2 (two) times daily.     Cholecalciferol (VITAMIN D3) 125 MCG (5000 UT) CAPS Take 5,000 Units by mouth daily.     ELIQUIS 5 MG TABS tablet TAKE 1 TABLET BY MOUTH TWICE A DAY 60 tablet 6   furosemide (LASIX) 20 MG tablet TAKE 1 TABLET (20 MG TOTAL) BY MOUTH AS NEEDED FOR FLUID OR EDEMA. 90 tablet 1   METAMUCIL FIBER PO Take 2 capsules by mouth daily as needed (regularity).     omeprazole (PRILOSEC OTC) 20 MG tablet Take 20 mg by mouth daily.     Ferrous Sulfate (IRON) 325 (65 Fe) MG TABS 1 tablet (Patient not taking: Reported on 08/30/2021)     rosuvastatin (CRESTOR) 20 MG tablet Take 1 tablet (20 mg total) by mouth daily. 90 tablet 1   vitamin B-12 (CYANOCOBALAMIN) 1000 MCG tablet Take 1,000 mcg by mouth daily. (Patient not taking: Reported on 08/30/2021)     No current facility-administered medications for this visit.    REVIEW OF SYSTEMS:    .10 Point review of Systems was done is negative except as noted above.   PHYSICAL EXAMINATION:  .BP (!) 146/45 (BP Location: Right Arm, Patient Position: Sitting)    Pulse 64    Temp 97.7 F (36.5 C) (Oral)    Resp 17    Ht 5\' 3"  (1.6 m)    Wt 180 lb 3.2 oz (81.7 kg)    SpO2 100%    BMI 31.92 kg/m  . GENERAL:alert, in no acute distress and comfortable SKIN: no acute rashes, no significant lesions EYES: conjunctiva are pink and non-injected, sclera anicteric OROPHARYNX: MMM, no exudates, no oropharyngeal erythema or ulceration NECK: supple, no JVD LYMPH:  no palpable lymphadenopathy in the cervical, axillary or inguinal regions LUNGS: clear to auscultation b/l with normal respiratory effort HEART: regular rate & rhythm ABDOMEN:  normoactive bowel sounds , non tender, not distended. Extremity: no pedal edema PSYCH: alert & oriented x 3 with fluent speech NEURO: no focal motor/sensory deficits   LABORATORY DATA:   . CBC Latest  Ref Rng & Units 08/30/2021 08/16/2021 07/26/2021  WBC 4.0 - 10.5 K/uL 4.1 4.6 5.0  Hemoglobin 12.0 - 15.0 g/dL 9.8(L) 8.5(L) 7.1(L)  Hematocrit 36.0 - 46.0 % 31.9(L) 27.8(L) 23.9(L)  Platelets 150 - 400 K/uL 205 224 279   CMP Latest Ref Rng & Units 08/30/2021 08/16/2021 07/19/2021  Glucose 70 - 99 mg/dL 91 127(H) 103(H)  BUN 8 - 23 mg/dL 24(H) 27(H) 29(H)  Creatinine 0.44 - 1.00 mg/dL 1.30(H) 1.30(H) 1.44(H)  Sodium 135 - 145 mmol/L 140 140 140  Potassium 3.5 - 5.1 mmol/L 4.1 4.2 4.4  Chloride 98 - 111 mmol/L 109 108 107  CO2 22 - 32 mmol/L 27 26 24   Calcium 8.9 - 90.3 mg/dL 9.3 9.2 8.8(L)  Total Protein 6.5 - 8.1 g/dL 6.8 6.4(L) 7.0  Total Bilirubin 0.3 - 1.2 mg/dL 0.8 0.5 0.3  Alkaline Phos 38 - 126  U/L 52 53 55  AST 15 - 41 U/L 17 23 17   ALT 0 - 44 U/L 10 15 11     Lab Results  Component Value Date   IRON 42 08/30/2021   TIBC 305 08/30/2021   IRONPCTSAT 14 08/30/2021   (Iron and TIBC)  Lab Results  Component Value Date   FERRITIN 395 (H) 08/30/2021   Final Pathologic Diagnosis      A. STOMACH, POLYPS, POLYPECTOMY:  Hyperplastic polyps. Negative for H. pylori by immunohistochemical stain. Negative for intestinal metaplasia, dysplasia, or malignancy.    Electronically signed by Payton Spark, MD on 08/25/2021 at  1:15 PM    RADIOGRAPHIC STUDIES: I have personally reviewed the radiological images as listed and agreed with the findings in the report. No results found.  ASSESSMENT & PLAN:   76 year old female with  #1 Normocytic/microcytic Anemia -due to severe iron deficiency.  #2 Severe iron deficiency. Likely due to GI bleeding from gastric polyps in the context of being on anticoagulation . Patient also has been on chronic PPI therapy for more than 20 years which would reduce oral iron absorption from food.  #3 Gastric polyps with a bleeding  #4 Atrial fibrillation on Eliquis PLAN -Patient has had resection of her remaining gastric polyps at [facility] on 08/19/2021.  Pathology is negative for Helicobacter pylori and showed hyperplastic polyps. -Patient's hemoglobin has been improved and is up from 7.1 to 9.8 -Ferritin more than 100 and she has no indication for additional IV iron at this time. -He tolerated IV Injectafer without any notable toxicities. -Continue p.o. vitamin B complex. -Anticoagulation per primary care physician and cardiology. -Continue follow-up with GI for management of remaining gastric polyps.    Follow-up Phone visit with Dr. Irene Limbo in 2 months Labs 1 day prior to phone visit  All of the patients questions were answered with apparent satisfaction. The patient knows to call the clinic with any problems, questions or concerns.   Sullivan Lone MD [place] AAHIVMS [facility] Hematology/Oncology Physician [facility]

## 2021-09-28 ENCOUNTER — Other Ambulatory Visit: Payer: Self-pay | Admitting: Nephrology

## 2021-09-28 DIAGNOSIS — N1831 Chronic kidney disease, stage 3a: Secondary | ICD-10-CM

## 2021-10-05 ENCOUNTER — Other Ambulatory Visit: Payer: Self-pay

## 2021-10-05 ENCOUNTER — Ambulatory Visit
Admission: RE | Admit: 2021-10-05 | Discharge: 2021-10-05 | Disposition: A | Discharge status: Home / Self Care | Payer: Medicare HMO | Source: Ambulatory Visit | Attending: Nephrology | Admitting: Nephrology

## 2021-10-05 DIAGNOSIS — N1831 Chronic kidney disease, stage 3a: Secondary | ICD-10-CM

## 2021-10-26 ENCOUNTER — Other Ambulatory Visit: Payer: Self-pay

## 2021-10-26 DIAGNOSIS — D500 Iron deficiency anemia secondary to blood loss (chronic): Secondary | ICD-10-CM

## 2021-10-27 ENCOUNTER — Inpatient Hospital Stay: Payer: Medicare HMO | Attending: Hematology

## 2021-10-27 ENCOUNTER — Other Ambulatory Visit: Payer: Self-pay

## 2021-10-27 DIAGNOSIS — I4891 Unspecified atrial fibrillation: Secondary | ICD-10-CM | POA: Insufficient documentation

## 2021-10-27 DIAGNOSIS — Z79899 Other long term (current) drug therapy: Secondary | ICD-10-CM | POA: Insufficient documentation

## 2021-10-27 DIAGNOSIS — D500 Iron deficiency anemia secondary to blood loss (chronic): Secondary | ICD-10-CM

## 2021-10-27 DIAGNOSIS — D509 Iron deficiency anemia, unspecified: Secondary | ICD-10-CM | POA: Insufficient documentation

## 2021-10-27 DIAGNOSIS — Z7901 Long term (current) use of anticoagulants: Secondary | ICD-10-CM | POA: Diagnosis not present

## 2021-10-27 LAB — CBC WITH DIFFERENTIAL/PLATELET
Abs Immature Granulocytes: 0.01 10*3/uL (ref 0.00–0.07)
Basophils Absolute: 0 10*3/uL (ref 0.0–0.1)
Basophils Relative: 1 %
Eosinophils Absolute: 0.3 10*3/uL (ref 0.0–0.5)
Eosinophils Relative: 6 %
HCT: 33 % — ABNORMAL LOW (ref 36.0–46.0)
Hemoglobin: 10.5 g/dL — ABNORMAL LOW (ref 12.0–15.0)
Immature Granulocytes: 0 %
Lymphocytes Relative: 21 %
Lymphs Abs: 1.1 10*3/uL (ref 0.7–4.0)
MCH: 27.3 pg (ref 26.0–34.0)
MCHC: 31.8 g/dL (ref 30.0–36.0)
MCV: 85.9 fL (ref 80.0–100.0)
Monocytes Absolute: 0.6 10*3/uL (ref 0.1–1.0)
Monocytes Relative: 12 %
Neutro Abs: 3.1 10*3/uL (ref 1.7–7.7)
Neutrophils Relative %: 60 %
Platelets: 199 10*3/uL (ref 150–400)
RBC: 3.84 MIL/uL — ABNORMAL LOW (ref 3.87–5.11)
RDW: 16.3 % — ABNORMAL HIGH (ref 11.5–15.5)
WBC: 5.2 10*3/uL (ref 4.0–10.5)
nRBC: 0 % (ref 0.0–0.2)

## 2021-10-27 LAB — IRON AND IRON BINDING CAPACITY (CC-WL,HP ONLY)
Iron: 28 ug/dL (ref 28–170)
Saturation Ratios: 10 % — ABNORMAL LOW (ref 10.4–31.8)
TIBC: 283 ug/dL (ref 250–450)
UIBC: 255 ug/dL

## 2021-10-27 LAB — FERRITIN: Ferritin: 118 ng/mL (ref 11–307)

## 2021-10-27 LAB — VITAMIN B12: Vitamin B-12: 1905 pg/mL — ABNORMAL HIGH (ref 180–914)

## 2021-10-28 ENCOUNTER — Inpatient Hospital Stay (HOSPITAL_BASED_OUTPATIENT_CLINIC_OR_DEPARTMENT_OTHER): Payer: Medicare HMO | Admitting: Hematology

## 2021-10-28 DIAGNOSIS — D500 Iron deficiency anemia secondary to blood loss (chronic): Secondary | ICD-10-CM | POA: Diagnosis not present

## 2021-10-28 DIAGNOSIS — E538 Deficiency of other specified B group vitamins: Secondary | ICD-10-CM

## 2021-10-28 DIAGNOSIS — D509 Iron deficiency anemia, unspecified: Secondary | ICD-10-CM | POA: Diagnosis not present

## 2021-10-31 ENCOUNTER — Telehealth: Payer: Self-pay | Admitting: Hematology

## 2021-10-31 NOTE — Telephone Encounter (Signed)
Scheduled follow-up appointments per 3/3 los. Patient is aware. 

## 2021-11-04 ENCOUNTER — Encounter: Payer: Self-pay | Admitting: Hematology

## 2021-11-04 NOTE — Progress Notes (Signed)
? ? ?HEMATOLOGY/ONCOLOGY CLINIC VISIT NOTE ? ?Date of Service .10/28/2021 ? ? ?Patient Care Team: ?[name], MD as PCP - General (Family Medicine) ? ?CHIEF COMPLAINTS/PURPOSE OF CONSULTATION:  ?Follow-up for continued evaluation and management of iron deficiency ? ?HISTORY OF PRESENTING ILLNESS: See previous note for details ? ?Interval history ? ?Amanda Frazier is here for continued evaluation and management of her iron deficiency anemia.  She notes that she is no longer having the epigastric discomfort she was having prior to her gastric polypectomies. ?She notes she is in the process of confirming her follow-up with her gastroenterologist at [facility] to reevaluate her remaining gastric polyps in a few months. ?She notes no overt GI bleeding since her last visit. ?Labs done today were reviewed with her in details. ? ? ?MEDICAL HISTORY:  ?Past Medical History:  ?Diagnosis Date  ? Atrial fibrillation ([place])   ? Hyperlipidemia   ? 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 ?SURGICAL HISTORY: ?Past Surgical History:  ?Procedure Laterality Date  ? BIOPSY  07/12/2021  ? Procedure: BIOPSY;  Surgeon: Wilford Corner, MD;  Location: [facility];  Service: Endoscopy;;  ? CARDIOVERSION  2021  ? COLONOSCOPY WITH PROPOFOL N/A 07/12/2021  ? Procedure: COLONOSCOPY WITH PROPOFOL;  Surgeon: Wilford Corner, MD;  Location: [facility];  Service: Endoscopy;  Laterality: N/A;  ? ESOPHAGOGASTRODUODENOSCOPY (EGD) WITH PROPOFOL N/A 07/12/2021  ? Procedure: ESOPHAGOGASTRODUODENOSCOPY (EGD) WITH PROPOFOL;  Surgeon: Wilford Corner, MD;  Location: [facility];  Service: Endoscopy;  Laterality: N/A;  ? POLYPECTOMY  07/12/2021  ? Procedure: POLYPECTOMY;  Surgeon: Wilford Corner, MD;  Location: [facility];  Service: Endoscopy;;  ? REPLACEMENT TOTAL KNEE Left 2015  ? SCLEROTHERAPY  07/12/2021  ?  Procedure: SCLEROTHERAPY;  Surgeon: Wilford Corner, MD;  Location: [facility];  Service: Endoscopy;;  ? SUBMUCOSAL TATTOO INJECTION  07/12/2021  ? Procedure: SUBMUCOSAL TATTOO INJECTION;  Surgeon: Wilford Corner, MD;  Location: [facility];  Service: Endoscopy;;  ? ? ?SOCIAL HISTORY: ?Social History  ? ?Socioeconomic History  ? Marital status: Married  ?  Spouse name: Not on file  ? Number of children: 2  ? Years of education: Not on file  ? Highest education level: Not on file  ?Occupational History  ? Not on file  ?Tobacco Use  ? Smoking status: Never  ? Smokeless tobacco: Never  ?Vaping Use  ? Vaping Use: Never used  ?Substance and Sexual Activity  ? Alcohol use: Never  ? Drug use: Never  ? Sexual activity: Not on file  ?Other Topics Concern  ? Not on file  ?Social History Narrative  ? Not on file  ? ?Social Determinants of Health  ? ?Financial Resource Strain: Not on file  ?Food Insecurity: Not on file  ?Transportation Needs: Not on file  ?Physical Activity: Not on file  ?Stress: Not on file  ?Social Connections: Not on file  ?Intimate Partner Violence: Not on file  ? ? ?FAMILY HISTORY: ?Family History  ?Problem Relation Age of Onset  ? Breast cancer Maternal Aunt   ? Atrial fibrillation Mother   ? Hyperlipidemia Mother   ? Lung cancer Mother   ? Atrial fibrillation Father   ? Colon cancer Father   ? Breast cancer Sister   ? Leukemia Brother   ? ? ?ALLERGIES:  is allergic to celecoxib, fosamax [alendronate sodium], lipitor [atorvastatin calcium], valsartan-hydrochlorothiazide, benazepril, nickel, palladium  chloride, simvastatin, and valsartan. ? ?MEDICATIONS:  ?Current Outpatient Medications  ?Medication Sig Dispense Refill  ? amiodarone (PACERONE) 100 MG tablet Take 1 tablet (100 mg total) by mouth daily. 90 tablet 3  ? amLODipine (NORVASC) 5 MG tablet Take 2 tablets (10 mg total) by mouth daily. 180 tablet 3  ? B Complex Vitamins (B-COMPLEX/B-12) LIQD Place under the tongue.    ? calcium carbonate  (TUMS - DOSED IN MG ELEMENTAL CALCIUM) 500 MG chewable tablet Chew 1 tablet by mouth 2 (two) times daily.    ? Cholecalciferol (VITAMIN D3) 125 MCG (5000 UT) CAPS Take 5,000 Units by mouth daily.    ? ELIQUIS 5 MG TABS tablet TAKE 1 TABLET BY MOUTH TWICE A DAY 60 tablet 6  ? furosemide (LASIX) 20 MG tablet TAKE 1 TABLET (20 MG TOTAL) BY MOUTH AS NEEDED FOR FLUID OR EDEMA. 90 tablet 1  ? METAMUCIL FIBER PO Take 2 capsules by mouth daily as needed (regularity).    ? omeprazole (PRILOSEC OTC) 20 MG tablet Take 20 mg by mouth daily.    ? rosuvastatin (CRESTOR) 20 MG tablet Take 1 tablet (20 mg total) by mouth daily. 90 tablet 1  ? vitamin B-12 (CYANOCOBALAMIN) 1000 MCG tablet Take 1,000 mcg by mouth daily. (Patient not taking: Reported on 08/30/2021)    ? ?No current facility-administered medications for this visit.  ? ? ?REVIEW OF SYSTEMS:   ?10 Point review of Systems was done is negative except as noted above. ? ? ?PHYSICAL EXAMINATION:  ?Telemedicine visit. ? ?LABORATORY DATA:  ? ?. ?CBC Latest Ref Rng & Units 10/27/2021 08/30/2021 08/16/2021  ?WBC 4.0 - 10.5 K/uL 5.2 4.1 4.6  ?Hemoglobin 12.0 - 15.0 g/dL 10.5(L) 9.8(L) 8.5(L)  ?Hematocrit 36.0 - 46.0 % 33.0(L) 31.9(L) 27.8(L)  ?Platelets 150 - 400 K/uL 199 205 224  ? ?CMP Latest Ref Rng & Units 08/30/2021 08/16/2021 07/19/2021  ?Glucose 70 - 99 mg/dL 91 127(H) 103(H)  ?BUN 8 - 23 mg/dL 24(H) 27(H) 29(H)  ?Creatinine 0.44 - 1.00 mg/dL 1.30(H) 1.30(H) 1.44(H)  ?Sodium 135 - 145 mmol/L 140 140 140  ?Potassium 3.5 - 5.1 mmol/L 4.1 4.2 4.4  ?Chloride 98 - 111 mmol/L 109 108 107  ?CO2 22 - 32 mmol/L '27 26 24  '$ ?Calcium 8.9 - 10.3 mg/dL 9.3 9.2 8.8(L)  ?Total Protein 6.5 - 8.1 g/dL 6.8 6.4(L) 7.0  ?Total Bilirubin 0.3 - 1.2 mg/dL 0.8 0.5 0.3  ?Alkaline Phos 38 - 126 U/L 52 53 55  ?AST 15 - 41 U/L '17 23 17  '$ ?ALT 0 - 44 U/L '10 15 11  '$ ? ? ?Lab Results  ?Component Value Date  ? IRON 28 10/27/2021  ? TIBC 283 10/27/2021  ? IRONPCTSAT 10 (L) 10/27/2021  ? ?(Iron and TIBC) ? ?Lab Results   ?Component Value Date  ? FERRITIN 118 10/27/2021  ? ?Final Pathologic Diagnosis    ?  ?A. STOMACH, POLYPS, POLYPECTOMY:  ?Hyperplastic polyps. ?Negative for H. pylori by immunohistochemical stain. ?Negative for intestinal metaplasia, dysplasia, or malignancy. ?   ?Electronically signed by Payton Spark, MD on 08/25/2021 at  1:15 PM  ? ? ?RADIOGRAPHIC STUDIES: ?I have personally reviewed the radiological images as listed and agreed with the findings in the report. ? ?ASSESSMENT & PLAN:  ? ?76 year old female with ? ?#1 Normocytic/microcytic Anemia -due to severe iron deficiency. ? ?#2 Severe iron deficiency. ?Likely due to GI bleeding from gastric polyps in the context of being on anticoagulation . ?Patient also has been  on chronic PPI therapy for more than 20 years which would reduce oral iron absorption from food. ? ?#3  History of gastric polyps with a bleeding ?Patient has had resection of her remaining gastric polyps at [facility] on 08/19/2021.  Pathology is negative for Helicobacter pylori and showed hyperplastic polyps. ? ?#4 Atrial fibrillation on Eliquis ?PLAN ?-Patient notes no clinically overt bleeding since her last visit. ?-CBC shows improvement in her hemoglobin to 10.5 with normal WBC count and platelets ?Ferritin at 118 with an iron saturation of 10% ?B12 adequate at 1905 ? ?-We discussed and she will set her up for 1 additional dose of IV Venofer to build up her iron stores ?-Decision for continued anticoagulation per PCP and cardiology. ?-Continue follow-up with GI at [facility] for consideration of repeat EGD ?-Continue vitamin B complex 1 capsule p.o. daily ? ? ?Follow-up ?note: Please schedule for additional dose of IV Venofer 300 mg x 1 dose in the next 1 to 2 weeks ?Return to clinic with Dr. Irene Limbo with labs in 12 weeks  ? ? ?All of the patients questions were answered with apparent satisfaction. The patient knows to call the clinic with any problems, questions or  concerns. ? ? ?Sullivan Lone MD MS AAHIVMS [facility] ?Hematology/Oncology Physician ?[place] ? ? ? ? ?

## 2021-11-11 ENCOUNTER — Other Ambulatory Visit: Payer: Self-pay

## 2021-11-11 ENCOUNTER — Inpatient Hospital Stay: Payer: Medicare HMO

## 2021-11-11 VITALS — BP 135/59 | HR 59 | Temp 98.2°F | Resp 16 | Wt 179.1 lb

## 2021-11-11 DIAGNOSIS — D509 Iron deficiency anemia, unspecified: Secondary | ICD-10-CM | POA: Diagnosis not present

## 2021-11-11 DIAGNOSIS — D500 Iron deficiency anemia secondary to blood loss (chronic): Secondary | ICD-10-CM

## 2021-11-11 MED ORDER — LORATADINE 10 MG PO TABS
10.0000 mg | ORAL_TABLET | Freq: Once | ORAL | Status: AC
  Administered 2021-11-11: 10 mg via ORAL
  Filled 2021-11-11: qty 1

## 2021-11-11 MED ORDER — SODIUM CHLORIDE 0.9 % IV SOLN
300.0000 mg | Freq: Once | INTRAVENOUS | Status: AC
  Administered 2021-11-11: 300 mg via INTRAVENOUS
  Filled 2021-11-11: qty 300

## 2021-11-11 MED ORDER — ACETAMINOPHEN 325 MG PO TABS
650.0000 mg | ORAL_TABLET | Freq: Once | ORAL | Status: AC
  Administered 2021-11-11: 650 mg via ORAL
  Filled 2021-11-11: qty 2

## 2021-11-11 NOTE — Patient Instructions (Signed)

## 2021-12-15 ENCOUNTER — Telehealth: Payer: Self-pay | Admitting: Hematology

## 2021-12-15 NOTE — Telephone Encounter (Signed)
Per provider reschedule called and left message for pt about appointment change call back number left if changes are needed ?

## 2022-01-10 NOTE — Progress Notes (Signed)
Primary Physician/Referring:  [name], MD  Patient ID: Amanda Frazier, female    DOB: [birth year], 76 y.o.   MRN: [id number]  Chief Complaint  Patient presents with   Hypertension   Atrial Fibrillation   Follow-up    6 month   HPI:    Amanda Frazier  is a 76 y.o. Caucasian female with hypertension, hyperlipidemia, hyperglycemia seen in the emergency room on 10/01/2019 for atrial fibrillation.  She was admitted to the hospital on 03/30/2020 with Covid pneumonia, and again admitted on 05/10/2020 for A. fib with RVR.  She was recently seen in the emergency department at [facility] 05/16/2021 in A. fib with RVR and heart failure, and underwent successful cardioversion on 05/18/2020.    Patient was last seen in our office 07/18/2021 at which time increase Crestor from 10 mg to 20 mg, repeat lipid profile was well controlled with LDL 85.  Patient now presents for 10-monthfollow-up.  Patient is feeling quite well without complaints today.  She continues to follow with GI and hematology for management and work-up of anemia.  She remains on Eliquis without significant bleeding diathesis.  She continues to walk regularly and exercises 2 to 3 days/week without issue.  Reports her energy level is quite good.  She monitors her blood pressure regularly at home reporting average readings 135/60s mmHg.  She has had no known recurrence of atrial fibrillation and has not needed Lasix in the last several months.  She remains careful with her diet.  Denies chest pain, dyspnea, palpitations, orthopnea, PND, leg edema.  Past Medical History:  Diagnosis Date   [place]    Hyperlipidemia    Hypertension    Family History  Problem Relation Age of Onset   Breast cancer Maternal Aunt    Atrial fibrillation Mother    Hyperlipidemia Mother    Lung cancer Mother    Atrial fibrillation Father    Colon cancer Father    Breast cancer Sister    Leukemia Brother    Past Surgical History:   Procedure Laterality Date   BIOPSY  07/12/2021   Procedure: BIOPSY;  Surgeon: SWilford Corner MD;  [facility]: [facility];  Service: Endoscopy;;   CARDIOVERSION  2021   COLONOSCOPY WITH PROPOFOL N/A 07/12/2021   Procedure: COLONOSCOPY WITH PROPOFOL;  Surgeon: SWilford Corner MD;  Location: [facility];  Service: Endoscopy;  Laterality: N/A;   ESOPHAGOGASTRODUODENOSCOPY (EGD) WITH PROPOFOL N/A 07/12/2021   Procedure: ESOPHAGOGASTRODUODENOSCOPY (EGD) WITH PROPOFOL;  Surgeon: SWilford Corner MD;  Location: [facility];  Service: Endoscopy;  Laterality: N/A;   POLYPECTOMY  07/12/2021   Procedure: POLYPECTOMY;  Surgeon: SWilford Corner MD;  [facility]: [facility];  Service: Endoscopy;;   REPLACEMENT TOTAL KNEE Left 2015   SCLEROTHERAPY  07/12/2021   Procedure: SCLEROTHERAPY;  Surgeon: SWilford Corner MD;  [facility]: [facility];  Service: Endoscopy;;   SUBMUCOSAL TATTOO INJECTION  07/12/2021   Procedure: SUBMUCOSAL TATTOO INJECTION;  Surgeon: SWilford Corner MD;  Location: [facility];  Service: Endoscopy;;   Social History   Tobacco Use   Smoking status: Never   Smokeless tobacco: Never  Substance Use Topics   Alcohol use: Never  Marital Status: Married  ROS  Review of Systems  Cardiovascular:  Negative for chest pain, claudication, dyspnea on exertion, leg swelling, near-syncope, orthopnea, palpitations, paroxysmal nocturnal dyspnea and syncope.  Hematologic/Lymphatic: Does not bruise/bleed easily.  Neurological:  Negative for dizziness.   Objective  Blood pressure (!) 146/54, pulse (!) 58, temperature 98 F (36.7  C), resp. rate 16, height '5\' 3"'  (1.6 m), weight 190 lb (86.2 kg), SpO2 99 %.     01/16/2022    8:23 AM 11/11/2021    4:45 PM 11/11/2021    2:34 PM  Vitals with BMI  Height '5\' 3"'     Weight 190 lbs  179 lbs 2 oz  BMI 49.67    Systolic 591 638 466  Diastolic 54 59 55  Pulse 58 59 62     Physical Exam Vitals reviewed.  Constitutional:       Appearance: She is obese.  Cardiovascular:     Rate and Rhythm: Normal rate and regular rhythm.     Pulses: Intact distal pulses.          Carotid pulses are 2+ on the right side and 2+ on the left side.      Radial pulses are 2+ on the right side and 2+ on the left side.       Posterior tibial pulses are 2+ on the right side and 2+ on the left side.     Heart sounds: S1 normal and S2 normal. Murmur heard.  High-pitched blowing holosystolic murmur is present with a grade of 2/6 at the apex.    No gallop.     Comments: No JVD.  Pulmonary:     Effort: Pulmonary effort is normal. No respiratory distress.     Breath sounds: Normal breath sounds. No wheezing, rhonchi or rales.  Musculoskeletal:     Right lower leg: Edema (trace) present.     Left lower leg: Edema (trace) present.   Laboratory examination:      Latest Ref Rng & Units 08/30/2021   10:55 AM 08/16/2021    1:02 PM 07/19/2021    2:19 PM  CMP  Glucose 70 - 99 mg/dL 91   127   103    BUN 8 - 23 mg/dL '24   27   29    ' Creatinine 0.44 - 1.00 mg/dL 1.30   1.30   1.44    Sodium 135 - 145 mmol/L 140   140   140    Potassium 3.5 - 5.1 mmol/L 4.1   4.2   4.4    Chloride 98 - 111 mmol/L 109   108   107    CO2 22 - 32 mmol/L '27   26   24    ' Calcium 8.9 - 10.3 mg/dL 9.3   9.2   8.8    Total Protein 6.5 - 8.1 g/dL 6.8   6.4   7.0    Total Bilirubin 0.3 - 1.2 mg/dL 0.8   0.5   0.3    Alkaline Phos 38 - 126 U/L 52   53   55    AST 15 - 41 U/L '17   23   17    ' ALT 0 - 44 U/L '10   15   11        ' Latest Ref Rng & Units 10/27/2021    3:45 PM 08/30/2021   10:55 AM 08/16/2021    1:02 PM  CBC  WBC 4.0 - 10.5 K/uL 5.2   4.1   4.6    Hemoglobin 12.0 - 15.0 g/dL 10.5   9.8   8.5    Hematocrit 36.0 - 46.0 % 33.0   31.9   27.8    Platelets 150 - 400 K/uL 199   205   224     Lipid Panel  Component Value Date/Time   CHOL 160 08/08/2021 1019   TRIG 93 08/08/2021 1019   HDL 58 08/08/2021 1019   LDLCALC 85 08/08/2021 1019   HEMOGLOBIN  A1C No results found for: HGBA1C, MPG TSH Recent Labs    07/19/21 1419  TSH 2.122    External labs:  06/23/2021: HDL 59, LDL 114, total cholesterol 190, triglycerides 95 A1c 4.7% BUN 24, creatinine 1.35, GFR 41 TSH 3.57  06/10/2020: HDL 43, LDL 90, total cholesterol 133, triglycerides 105 A1c 5.2% BUN 9.0, creatinine 1.23, EGFR 43 TSH 4.79  12/09/2019: BUN 22, SrCr 1.23  10/29/2019: TSH 2.06  Labs 10/01/2019: Hb 12.7/HCT 40.0, platelets 324.  Serum glucose 116 mg, BUN 18, creatinine 1.09, EGFR 50 mL.  Calcium minimally elevated at 10.4, CMP otherwise normal.    05/20/2019: HDL 53, LDL 103, triglycerides 121, total 180  Allergies   Allergies  Allergen Reactions   Celecoxib Nausea And Vomiting   Fosamax [Alendronate Sodium] Nausea And Vomiting   Lipitor [Atorvastatin Calcium]     Fatigue   Valsartan-Hydrochlorothiazide     Other reaction(s): cough   Benazepril     Other reaction(s): Cough   Nickel     Other reaction(s): Other (See Comments) Positive patch test   Palladium Chloride     Other reaction(s): Other (See Comments) Positive patch test   Simvastatin Diarrhea    Other reaction(s): diarrhea   Valsartan     Other reaction(s): Cough    Medications Prior to Visit:   Outpatient Medications Prior to Visit  Medication Sig Dispense Refill   amiodarone (PACERONE) 100 MG tablet Take 1 tablet (100 mg total) by mouth daily. 90 tablet 3   amLODipine (NORVASC) 5 MG tablet Take 2 tablets (10 mg total) by mouth daily. 180 tablet 3   B Complex Vitamins (B-COMPLEX/B-12) LIQD Place under the tongue.     calcium carbonate (TUMS - DOSED IN MG ELEMENTAL CALCIUM) 500 MG chewable tablet Chew 1 tablet by mouth 2 (two) times daily.     Cholecalciferol (VITAMIN D3) 125 MCG (5000 UT) CAPS Take 5,000 Units by mouth daily.     ELIQUIS 5 MG TABS tablet TAKE 1 TABLET BY MOUTH TWICE A DAY 60 tablet 6   furosemide (LASIX) 20 MG tablet TAKE 1 TABLET (20 MG TOTAL) BY MOUTH AS NEEDED  FOR FLUID OR EDEMA. 90 tablet 1   METAMUCIL FIBER PO Take 2 capsules by mouth daily as needed (regularity).     omeprazole (PRILOSEC OTC) 20 MG tablet Take 20 mg by mouth daily.     rosuvastatin (CRESTOR) 20 MG tablet Take 1 tablet (20 mg total) by mouth daily. 90 tablet 1   vitamin B-12 (CYANOCOBALAMIN) 1000 MCG tablet Take 1,000 mcg by mouth daily. (Patient not taking: Reported on 08/30/2021)     No facility-administered medications prior to visit.   Final Medications at End of Visit    No outpatient medications have been marked as taking for the 01/16/22 encounter (Office Visit) with Rayetta Pigg, Owais Pruett C, PA-C.   Radiology:   Chest x-ray PA and lateral view 09/30/2019: There is minimal scarring versus atelectasis on the lateral left lung base. No focal infiltrate is seen. There is no pleural effusion or pneumothorax. Cardiac silhouette is borderline enlarged. Mediastinal contours are normal. There are mild degenerative changes at the shoulders and throughout the spine. Impression:  Borderline cardiomegaly. No visualized acute abnormality.   Chest x-ray AP 05/16/2020:  Stable left greater than right effusions and bibasilar atelectasis. Cannot  exclude developing infection.   Cardiac Studies:   Lexiscan (Walking with mod Bruce)Tetrofosmin Stress Test  10/27/2019:  Nondiagnostic ECG stress. Inferior and inferolateral breast tissue attenuation noted. Myocardial perfusion is normal. Overall LV systolic function is normal without regional wall motion abnormalities. Stress LV EF: 55%.  No previous exam available for comparison. Low risk study.   PCV ECHOCARDIOGRAM LIMITED 07/29/2020 Left ventricle cavity is normal in size. Mild concentric hypertrophy of the left ventricle. Normal global wall motion. Normal LV systolic function with visual EF 50-55%. Doppler evidence of grade I (impaired) diastolic dysfunction, elevated LAP. Left atrial cavity is at least moderately dilated. Moderate (Grade  III) mitral regurgitation. Mild to moderate tricuspid regurgitation. Estimated pulmonary artery systolic pressure 44 mmHg. Compared to previous study on 11/05/2019, pulmonary hypertension is new.  EKG  01/16/2022: Sinus rhythm at a rate of 57 bpm.  Left axis, left anterior fascicular block.  Incomplete right bundle branch block.  Poor R wave pression, cannot exclude anteroseptal infarct old.  Low voltage complexes, consider pulmonary disease pattern.  Compared EKG 07/18/2021, no significant change.  08/26/2020: Marked sinus bradycardia with first-degree AV block at a rate of 49 bpm.  Left axis.  Incomplete right bundle branch block.  PRWP, cannot exclude anteroseptal infarct old.  Nonspecific T wave abnormality. Borderline QT interval.   08/05/2020: Marked sinus bradycardia at the rate of 45 bpm, left axis deviation, incomplete right bundle branch block.  Poor R wave progression, cannot exclude anteroseptal infarct old.  Nonspecific T abnormality.  Normal QT interval.    05/25/2020:Sinus bradycardia at a rate of 57 bpm with first-degree AV block, left atrial enlargement.  Normal axis.  Incomplete right bundle branch block.  Poor R wave progression, cannot exclude anterior infarct old.  Nonspecific T wave abnormality.  Compared to EKG 10/15/2019, first-degree AV block and left atrial enlargement new.  10/01/2019: Atrial fibrillation with rapid ventricular response with rate of 111 bpm, normal axis, nonspecific ST depression in V1 and V2, cannot exclude septal ischemia.  Low-voltage complexes.  Abnormal EKG.  Assessment     ICD-10-CM   1. Paroxysmal atrial fibrillation ([place]). CHA2DS2-VASc Score is 3.  Yearly risk of stroke: 3.2% (A, F, HTN)  I48.0 EKG 12-Lead    2. Hypercholesteremia  E78.00     3. Primary hypertension  I10      No orders of the defined types were placed in this encounter.   Medications Discontinued During This Encounter  Medication Reason   vitamin B-12 (CYANOCOBALAMIN) 1000  MCG tablet     This patients CHA2DS2-VASc Score 3 (A, F, HTN) and yearly risk of stroke 3.2%.   Recommendations:   Amanda Frazier  is a 76 y.o. Caucasian female with hypertension, hyperlipidemia, hyperglycemia seen in the emergency room on 10/01/2019 for atrial fibrillation.  She was admitted to the hospital on 03/30/2020 with Covid pneumonia, and again admitted on 05/10/2020 for A. fib with RVR.  She was recently seen in the emergency department at [facility] 05/16/2021 in A. fib with RVR and heart failure, and underwent successful cardioversion on 05/18/2020. Etiology for atrial fibrillation includes uncontrolled hypertension, and obesity.    Patient was last seen in our office 07/18/2021 at which time increase Crestor from 10 mg to 20 mg, repeat lipid profile was well controlled with LDL 85.  Patient now presents for 77-monthfollow-up.  Patient is doing quite well and remains asymptomatic from a cardiovascular standpoint.  She continues to tolerate anticoagulation without significant bleeding diathesis and anemia has  been stable per GI and hematology.  Patient's blood pressure is mildly elevated in the office today, however she monitors it regularly at home and readings are well controlled.  Will not make changes to antihypertensive medications at this time.  Patient has had no known recurrence of atrial fibrillation.  Lipids are well controlled.  Continue amiodarone, amlodipine, Eliquis, rosuvastatin, and as needed Lasix.  Notably patient has had multiple medication intolerances both ACE inhibitor's and ARB use as well as renal dysfunction with hydrochlorothiazide. Patient and her PCP are aware she needs annual monitoring of PFTs, thyroid function, liver function, and ophthalmologic exam as she is being treated with amiodarone.  Follow up in 6 months, sooner if needed.    Alethia Berthold, PA-C 01/16/2022, 8:54 AM Office: [phone]

## 2022-01-16 ENCOUNTER — Other Ambulatory Visit: Payer: Medicare HMO

## 2022-01-16 ENCOUNTER — Encounter: Payer: Self-pay | Admitting: Student

## 2022-01-16 ENCOUNTER — Ambulatory Visit: Payer: Medicare HMO | Admitting: Hematology

## 2022-01-16 ENCOUNTER — Ambulatory Visit: Payer: Medicare HMO | Admitting: Student

## 2022-01-16 ENCOUNTER — Other Ambulatory Visit: Payer: Self-pay

## 2022-01-16 VITALS — BP 146/54 | HR 58 | Temp 98.0°F | Resp 16 | Ht 63.0 in | Wt 190.0 lb

## 2022-01-16 DIAGNOSIS — D500 Iron deficiency anemia secondary to blood loss (chronic): Secondary | ICD-10-CM

## 2022-01-16 DIAGNOSIS — I480 Paroxysmal atrial fibrillation: Secondary | ICD-10-CM

## 2022-01-16 DIAGNOSIS — I10 Essential (primary) hypertension: Secondary | ICD-10-CM

## 2022-01-16 DIAGNOSIS — E7800 Pure hypercholesterolemia, unspecified: Secondary | ICD-10-CM

## 2022-01-17 ENCOUNTER — Inpatient Hospital Stay: Payer: Medicare HMO | Attending: Hematology

## 2022-01-17 ENCOUNTER — Inpatient Hospital Stay (HOSPITAL_BASED_OUTPATIENT_CLINIC_OR_DEPARTMENT_OTHER): Payer: Medicare HMO | Admitting: Hematology

## 2022-01-17 VITALS — BP 148/53 | HR 60 | Temp 97.5°F | Resp 18 | Wt 179.4 lb

## 2022-01-17 DIAGNOSIS — I509 Heart failure, unspecified: Secondary | ICD-10-CM | POA: Diagnosis not present

## 2022-01-17 DIAGNOSIS — D500 Iron deficiency anemia secondary to blood loss (chronic): Secondary | ICD-10-CM

## 2022-01-17 DIAGNOSIS — E538 Deficiency of other specified B group vitamins: Secondary | ICD-10-CM

## 2022-01-17 DIAGNOSIS — D509 Iron deficiency anemia, unspecified: Secondary | ICD-10-CM | POA: Insufficient documentation

## 2022-01-17 DIAGNOSIS — Z79899 Other long term (current) drug therapy: Secondary | ICD-10-CM | POA: Diagnosis not present

## 2022-01-17 DIAGNOSIS — Z7901 Long term (current) use of anticoagulants: Secondary | ICD-10-CM | POA: Diagnosis not present

## 2022-01-17 LAB — IRON AND IRON BINDING CAPACITY (CC-WL,HP ONLY)
Iron: 39 ug/dL (ref 28–170)
Saturation Ratios: 13 % (ref 10.4–31.8)
TIBC: 295 ug/dL (ref 250–450)
UIBC: 256 ug/dL (ref 148–442)

## 2022-01-17 LAB — CMP (CANCER CENTER ONLY)
ALT: 16 U/L (ref 0–44)
AST: 21 U/L (ref 15–41)
Albumin: 3.8 g/dL (ref 3.5–5.0)
Alkaline Phosphatase: 59 U/L (ref 38–126)
Anion gap: 4 — ABNORMAL LOW (ref 5–15)
BUN: 29 mg/dL — ABNORMAL HIGH (ref 8–23)
CO2: 29 mmol/L (ref 22–32)
Calcium: 9.3 mg/dL (ref 8.9–10.3)
Chloride: 106 mmol/L (ref 98–111)
Creatinine: 1.3 mg/dL — ABNORMAL HIGH (ref 0.44–1.00)
GFR, Estimated: 43 mL/min — ABNORMAL LOW (ref >60)
Glucose, Bld: 123 mg/dL — ABNORMAL HIGH (ref 70–99)
Potassium: 4.3 mmol/L (ref 3.5–5.1)
Sodium: 139 mmol/L (ref 135–145)
Total Bilirubin: 0.6 mg/dL (ref 0.3–1.2)
Total Protein: 6.9 g/dL (ref 6.5–8.1)

## 2022-01-17 LAB — CBC WITH DIFFERENTIAL (CANCER CENTER ONLY)
Abs Immature Granulocytes: 0.01 10*3/uL (ref 0.00–0.07)
Basophils Absolute: 0 10*3/uL (ref 0.0–0.1)
Basophils Relative: 0 %
Eosinophils Absolute: 0.1 10*3/uL (ref 0.0–0.5)
Eosinophils Relative: 2 %
HCT: 32.5 % — ABNORMAL LOW (ref 36.0–46.0)
Hemoglobin: 10.8 g/dL — ABNORMAL LOW (ref 12.0–15.0)
Immature Granulocytes: 0 %
Lymphocytes Relative: 27 %
Lymphs Abs: 1.2 10*3/uL (ref 0.7–4.0)
MCH: 29.9 pg (ref 26.0–34.0)
MCHC: 33.2 g/dL (ref 30.0–36.0)
MCV: 90 fL (ref 80.0–100.0)
Monocytes Absolute: 0.4 10*3/uL (ref 0.1–1.0)
Monocytes Relative: 8 %
Neutro Abs: 2.8 10*3/uL (ref 1.7–7.7)
Neutrophils Relative %: 63 %
Platelet Count: 179 10*3/uL (ref 150–400)
RBC: 3.61 MIL/uL — ABNORMAL LOW (ref 3.87–5.11)
RDW: 14 % (ref 11.5–15.5)
WBC Count: 4.4 10*3/uL (ref 4.0–10.5)
nRBC: 0 % (ref 0.0–0.2)

## 2022-01-17 LAB — FERRITIN: Ferritin: 120 ng/mL (ref 11–307)

## 2022-01-17 LAB — VITAMIN B12: Vitamin B-12: 2056 pg/mL — ABNORMAL HIGH (ref 180–914)

## 2022-01-18 ENCOUNTER — Telehealth: Payer: Self-pay | Admitting: Hematology

## 2022-01-18 NOTE — Telephone Encounter (Signed)
Scheduled per 5/23 los, message has been left with pt

## 2022-01-22 ENCOUNTER — Other Ambulatory Visit: Payer: Self-pay | Admitting: Student

## 2022-01-23 ENCOUNTER — Encounter: Payer: Self-pay | Admitting: Hematology

## 2022-01-23 NOTE — Progress Notes (Signed)
HEMATOLOGY/ONCOLOGY CLINIC VISIT NOTE  Date of Service .01/17/2022   Patient Care Team: [name], MD as PCP - General (Family Medicine)  CHIEF COMPLAINTS/PURPOSE OF CONSULTATION:  Follow-up for continued evaluation and management of iron deficiency anemia  HISTORY OF PRESENTING ILLNESS: See previous note for details  Interval history  Amanda Frazier is here for continued evaluation and management of iron deficiency anemia.  She notes no abdominal pain and no overt evidence of GI bleeding.  No black stools or blood in the stools. Continues to be on Eliquis for her atrial fibrillation. Labs done today were reviewed in detail with the patient.  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  SURGICAL HISTORY: Past Surgical History:  Procedure Laterality Date   BIOPSY  07/12/2021   Procedure: BIOPSY;  Surgeon: Wilford Corner, MD;  Location: [facility];  Service: Endoscopy;;   CARDIOVERSION  2021   COLONOSCOPY WITH PROPOFOL N/A 07/12/2021   Procedure: COLONOSCOPY WITH PROPOFOL;  Surgeon: Wilford Corner, MD;  Location: [facility];  Service: Endoscopy;  Laterality: N/A;   ESOPHAGOGASTRODUODENOSCOPY (EGD) WITH PROPOFOL N/A 07/12/2021   Procedure: ESOPHAGOGASTRODUODENOSCOPY (EGD) WITH PROPOFOL;  Surgeon: Wilford Corner, MD;  [facility]: [facility];  Service: Endoscopy;  Laterality: N/A;   POLYPECTOMY  07/12/2021   Procedure: POLYPECTOMY;  Surgeon: Wilford Corner, MD;  [facility];  Service: Endoscopy;;   REPLACEMENT TOTAL KNEE Left 2015   SCLEROTHERAPY  07/12/2021   Procedure: SCLEROTHERAPY;  Surgeon: Wilford Corner, MD;  [facility];  Service: Endoscopy;;   SUBMUCOSAL TATTOO INJECTION  07/12/2021    Procedure: SUBMUCOSAL TATTOO INJECTION;  Surgeon: Wilford Corner, MD;  [facility];  Service: Endoscopy;;    SOCIAL HISTORY: Social History   Socioeconomic History   Marital status: Married    Spouse name: Not on file   Number of children: 2   Years of education: Not on file   Highest education level: Not on file  Occupational History   Not on file  Tobacco Use   Smoking status: Never   Smokeless tobacco: Never  Vaping Use   Vaping Use: Never used  Substance and Sexual Activity   Alcohol use: Never   Drug use: Never   Sexual activity: Not on file  Other Topics Concern   Not on file  Social History Narrative   Not on file   Social Determinants of Health   Financial Resource Strain: Not on file  Food Insecurity: Not on file  Transportation Needs: Not on file  Physical Activity: Not on file  Stress: Not on file  Social Connections: Not on file  Intimate Partner Violence: Not on file    FAMILY HISTORY: Family History  Problem Relation Age of Onset   Breast cancer Maternal Aunt    Atrial fibrillation Mother    Hyperlipidemia Mother    Lung cancer Mother    Atrial fibrillation Father    Colon cancer Father    Breast cancer Sister    Leukemia Brother     ALLERGIES:  is allergic to celecoxib, fosamax [alendronate sodium], lipitor [atorvastatin calcium], valsartan-hydrochlorothiazide, benazepril, nickel, palladium chloride, simvastatin, and valsartan.  MEDICATIONS:  Current Outpatient Medications  Medication Sig Dispense Refill   amiodarone (PACERONE) 100 MG tablet Take 1 tablet (  100 mg total) by mouth daily. 90 tablet 3   amLODipine (NORVASC) 5 MG tablet Take 2 tablets (10 mg total) by mouth daily. 180 tablet 3   B Complex Vitamins (B-COMPLEX/B-12) LIQD Place under the tongue.     calcium carbonate (TUMS - DOSED IN MG ELEMENTAL CALCIUM) 500 MG chewable tablet Chew 1 tablet by mouth 2 (two) times daily.     Cholecalciferol (VITAMIN D3) 125 MCG (5000  UT) CAPS Take 5,000 Units by mouth daily.     ELIQUIS 5 MG TABS tablet TAKE 1 TABLET BY MOUTH TWICE A DAY 60 tablet 6   furosemide (LASIX) 20 MG tablet TAKE 1 TABLET (20 MG TOTAL) BY MOUTH AS NEEDED FOR FLUID OR EDEMA. 90 tablet 1   METAMUCIL FIBER PO Take 2 capsules by mouth daily as needed (regularity).     omeprazole (PRILOSEC OTC) 20 MG tablet Take 20 mg by mouth daily.     rosuvastatin (CRESTOR) 20 MG tablet Take 1 tablet (20 mg total) by mouth daily. 90 tablet 1   No current facility-administered medications for this visit.    REVIEW OF SYSTEMS:   10 Point review of Systems was done is negative except as noted above.  PHYSICAL EXAMINATION:  Telemedicine visit.  LABORATORY DATA:   .    Latest Ref Rng & Units 01/17/2022    1:25 PM 10/27/2021    3:45 PM 08/30/2021   10:55 AM  CBC  WBC 4.0 - 10.5 K/uL 4.4   5.2   4.1    Hemoglobin 12.0 - 15.0 g/dL 10.8   10.5   9.8    Hematocrit 36.0 - 46.0 % 32.5   33.0   31.9    Platelets 150 - 400 K/uL 179   199   205        Latest Ref Rng & Units 01/17/2022    1:25 PM 08/30/2021   10:55 AM 08/16/2021    1:02 PM  CMP  Glucose 70 - 99 mg/dL 123   91   127    BUN 8 - 23 mg/dL '29   24   27    '$ Creatinine 0.44 - 1.00 mg/dL 1.30   1.30   1.30    Sodium 135 - 145 mmol/L 139   140   140    Potassium 3.5 - 5.1 mmol/L 4.3   4.1   4.2    Chloride 98 - 111 mmol/L 106   109   108    CO2 22 - 32 mmol/L '29   27   26    '$ Calcium 8.9 - 10.3 mg/dL 9.3   9.3   9.2    Total Protein 6.5 - 8.1 g/dL 6.9   6.8   6.4    Total Bilirubin 0.3 - 1.2 mg/dL 0.6   0.8   0.5    Alkaline Phos 38 - 126 U/L 59   52   53    AST 15 - 41 U/L '21   17   23    '$ ALT 0 - 44 U/L '16   10   15      '$ Lab Results  Component Value Date   IRON 39 01/17/2022   TIBC 295 01/17/2022   IRONPCTSAT 13 01/17/2022   (Iron and TIBC)  Lab Results  Component Value Date   FERRITIN 120 01/17/2022   Final Pathologic Diagnosis      A. STOMACH, POLYPS, POLYPECTOMY:  Hyperplastic  polyps. Negative for H. pylori by immunohistochemical  stain. Negative for intestinal metaplasia, dysplasia, or malignancy.    Electronically signed by Payton Spark, MD on 08/25/2021 at  1:15 PM    RADIOGRAPHIC STUDIES: I have personally reviewed the radiological images as listed and agreed with the findings in the report.  ASSESSMENT & PLAN:   76 year old female with  #1 Normocytic/microcytic Anemia -due to severe iron deficiency.  #2 Severe iron deficiency. Likely due to GI bleeding from gastric polyps in the context of being on anticoagulation . Patient also has been on chronic PPI therapy for more than 20 years which would reduce oral iron absorption from food.  #3  History of gastric polyps with a bleeding Patient has had resection of her remaining gastric polyps at [facility] on 08/19/2021.  Pathology is negative for Helicobacter pylori and showed hyperplastic polyps.  #4 Atrial fibrillation on Eliquis PLAN Patient notes no overt visible GI bleeding since her last clinic visit. Labs done today show improvement in hemoglobin to 10.8 with normal WBC count and platelets. Ferritin 120 with an iron saturation of 13% We will set her up for IV Venofer 200 mg weekly x2 more doses to build up her iron stores to maintain ferritin closer to 200 and iron saturation -continue vitamin B complex to support accelerated hematopoiesis -Decision for continued anticoagulation per PCP and cardiology. -Continue follow-up with GI at [facility] for consideration of repeat EGD   Follow-up Plz schedule for 2 doses of venofer '300mg'$  weekly Labs in 3 months RTC with Dr Irene Limbo with labs in 6 months  The total time spent in the appointment was 21 minutes*.  All of the patient's questions were answered with apparent satisfaction. The patient knows to call the clinic with any problems, questions or concerns.   Sullivan Lone MD MS AAHIVMS [facility] Hematology/Oncology Physician [facility]  .*Total Encounter Time as defined by the Centers for Medicare and Medicaid Services includes, in addition to the face-to-face time of a patient visit (documented in the note above) non-face-to-face time: obtaining and reviewing outside history, ordering and reviewing medications, tests or procedures, care coordination (communications with other health care professionals or caregivers) and documentation in the medical record.

## 2022-01-27 ENCOUNTER — Inpatient Hospital Stay: Payer: Medicare HMO | Attending: Hematology

## 2022-01-27 ENCOUNTER — Other Ambulatory Visit: Payer: Self-pay | Admitting: Hematology

## 2022-01-27 ENCOUNTER — Other Ambulatory Visit: Payer: Self-pay

## 2022-01-27 VITALS — BP 140/78 | HR 51 | Temp 97.8°F | Resp 17

## 2022-01-27 DIAGNOSIS — D509 Iron deficiency anemia, unspecified: Secondary | ICD-10-CM | POA: Insufficient documentation

## 2022-01-27 DIAGNOSIS — D500 Iron deficiency anemia secondary to blood loss (chronic): Secondary | ICD-10-CM

## 2022-01-27 MED ORDER — ACETAMINOPHEN 325 MG PO TABS
650.0000 mg | ORAL_TABLET | Freq: Once | ORAL | Status: AC
  Administered 2022-01-27: 650 mg via ORAL
  Filled 2022-01-27: qty 2

## 2022-01-27 MED ORDER — LORATADINE 10 MG PO TABS
10.0000 mg | ORAL_TABLET | Freq: Once | ORAL | Status: AC
  Administered 2022-01-27: 10 mg via ORAL
  Filled 2022-01-27: qty 1

## 2022-01-27 MED ORDER — SODIUM CHLORIDE 0.9 % IV SOLN
300.0000 mg | Freq: Once | INTRAVENOUS | Status: AC
  Administered 2022-01-27: 300 mg via INTRAVENOUS
  Filled 2022-01-27: qty 15

## 2022-01-27 MED ORDER — SODIUM CHLORIDE 0.9 % IV SOLN: INTRAVENOUS | Status: DC

## 2022-01-27 NOTE — Progress Notes (Signed)
Patient tolerated IV iron infusion well. Declined to stay for 30 minute post observation period. VSS, ambulatory to lobby.  

## 2022-01-27 NOTE — Patient Instructions (Signed)

## 2022-02-03 ENCOUNTER — Other Ambulatory Visit: Payer: Self-pay

## 2022-02-03 ENCOUNTER — Inpatient Hospital Stay (HOSPITAL_BASED_OUTPATIENT_CLINIC_OR_DEPARTMENT_OTHER): Payer: Medicare HMO

## 2022-02-03 DIAGNOSIS — D500 Iron deficiency anemia secondary to blood loss (chronic): Secondary | ICD-10-CM

## 2022-02-03 DIAGNOSIS — D509 Iron deficiency anemia, unspecified: Secondary | ICD-10-CM | POA: Diagnosis not present

## 2022-02-03 MED ORDER — SODIUM CHLORIDE 0.9 % IV SOLN: Freq: Once | INTRAVENOUS | Status: AC

## 2022-02-03 MED ORDER — LORATADINE 10 MG PO TABS
10.0000 mg | ORAL_TABLET | Freq: Once | ORAL | Status: AC
  Administered 2022-02-03: 10 mg via ORAL
  Filled 2022-02-03: qty 1

## 2022-02-03 MED ORDER — ACETAMINOPHEN 325 MG PO TABS
650.0000 mg | ORAL_TABLET | Freq: Once | ORAL | Status: AC
  Administered 2022-02-03: 650 mg via ORAL
  Filled 2022-02-03: qty 2

## 2022-02-03 MED ORDER — SODIUM CHLORIDE 0.9 % IV SOLN
300.0000 mg | Freq: Once | INTRAVENOUS | Status: AC
  Administered 2022-02-03: 300 mg via INTRAVENOUS
  Filled 2022-02-03: qty 300

## 2022-02-03 NOTE — Patient Instructions (Signed)

## 2022-02-21 ENCOUNTER — Other Ambulatory Visit: Payer: Self-pay | Admitting: Student

## 2022-02-23 ENCOUNTER — Other Ambulatory Visit: Payer: Self-pay | Admitting: Student

## 2022-02-23 DIAGNOSIS — I480 Paroxysmal atrial fibrillation: Secondary | ICD-10-CM

## 2022-02-23 NOTE — Telephone Encounter (Signed)
Refill request

## 2022-03-11 ENCOUNTER — Other Ambulatory Visit: Payer: Self-pay | Admitting: Student

## 2022-03-11 DIAGNOSIS — I480 Paroxysmal atrial fibrillation: Secondary | ICD-10-CM

## 2022-04-17 ENCOUNTER — Other Ambulatory Visit: Payer: Self-pay | Admitting: *Deleted

## 2022-04-17 DIAGNOSIS — D500 Iron deficiency anemia secondary to blood loss (chronic): Secondary | ICD-10-CM

## 2022-04-17 DIAGNOSIS — E538 Deficiency of other specified B group vitamins: Secondary | ICD-10-CM

## 2022-04-18 ENCOUNTER — Inpatient Hospital Stay: Payer: Medicare HMO | Attending: Hematology

## 2022-04-18 DIAGNOSIS — D500 Iron deficiency anemia secondary to blood loss (chronic): Secondary | ICD-10-CM

## 2022-04-18 DIAGNOSIS — E538 Deficiency of other specified B group vitamins: Secondary | ICD-10-CM

## 2022-04-18 DIAGNOSIS — D509 Iron deficiency anemia, unspecified: Secondary | ICD-10-CM | POA: Insufficient documentation

## 2022-04-18 LAB — IRON AND IRON BINDING CAPACITY (CC-WL,HP ONLY)
Iron: 30 ug/dL (ref 28–170)
Saturation Ratios: 11 % (ref 10.4–31.8)
TIBC: 277 ug/dL (ref 250–450)
UIBC: 247 ug/dL (ref 148–442)

## 2022-04-18 LAB — CMP (CANCER CENTER ONLY)
ALT: 10 U/L (ref 0–44)
AST: 18 U/L (ref 15–41)
Albumin: 4 g/dL (ref 3.5–5.0)
Alkaline Phosphatase: 59 U/L (ref 38–126)
Anion gap: 5 (ref 5–15)
BUN: 25 mg/dL — ABNORMAL HIGH (ref 8–23)
CO2: 29 mmol/L (ref 22–32)
Calcium: 9.6 mg/dL (ref 8.9–10.3)
Chloride: 105 mmol/L (ref 98–111)
Creatinine: 1.33 mg/dL — ABNORMAL HIGH (ref 0.44–1.00)
GFR, Estimated: 41 mL/min — ABNORMAL LOW (ref >60)
Glucose, Bld: 95 mg/dL (ref 70–99)
Potassium: 4.5 mmol/L (ref 3.5–5.1)
Sodium: 139 mmol/L (ref 135–145)
Total Bilirubin: 0.9 mg/dL (ref 0.3–1.2)
Total Protein: 6.7 g/dL (ref 6.5–8.1)

## 2022-04-18 LAB — CBC WITH DIFFERENTIAL (CANCER CENTER ONLY)
Abs Immature Granulocytes: 0 10*3/uL (ref 0.00–0.07)
Basophils Absolute: 0 10*3/uL (ref 0.0–0.1)
Basophils Relative: 1 %
Eosinophils Absolute: 0 10*3/uL (ref 0.0–0.5)
Eosinophils Relative: 1 %
HCT: 31.9 % — ABNORMAL LOW (ref 36.0–46.0)
Hemoglobin: 10.7 g/dL — ABNORMAL LOW (ref 12.0–15.0)
Immature Granulocytes: 0 %
Lymphocytes Relative: 25 %
Lymphs Abs: 1 10*3/uL (ref 0.7–4.0)
MCH: 30.1 pg (ref 26.0–34.0)
MCHC: 33.5 g/dL (ref 30.0–36.0)
MCV: 89.9 fL (ref 80.0–100.0)
Monocytes Absolute: 0.4 10*3/uL (ref 0.1–1.0)
Monocytes Relative: 10 %
Neutro Abs: 2.5 10*3/uL (ref 1.7–7.7)
Neutrophils Relative %: 63 %
Platelet Count: 160 10*3/uL (ref 150–400)
RBC: 3.55 MIL/uL — ABNORMAL LOW (ref 3.87–5.11)
RDW: 13.2 % (ref 11.5–15.5)
WBC Count: 3.9 10*3/uL — ABNORMAL LOW (ref 4.0–10.5)
nRBC: 0 % (ref 0.0–0.2)

## 2022-04-18 LAB — FERRITIN: Ferritin: 235 ng/mL (ref 11–307)

## 2022-04-18 LAB — VITAMIN B12: Vitamin B-12: >7500 pg/mL — ABNORMAL HIGH (ref 180–914)

## 2022-07-17 ENCOUNTER — Other Ambulatory Visit: Payer: Self-pay

## 2022-07-17 DIAGNOSIS — D500 Iron deficiency anemia secondary to blood loss (chronic): Secondary | ICD-10-CM

## 2022-07-18 ENCOUNTER — Inpatient Hospital Stay: Payer: Medicare HMO

## 2022-07-18 ENCOUNTER — Inpatient Hospital Stay: Payer: Medicare HMO | Attending: Hematology | Admitting: Hematology

## 2022-07-18 ENCOUNTER — Other Ambulatory Visit: Payer: Self-pay

## 2022-07-18 VITALS — BP 152/55 | HR 60 | Temp 98.2°F | Resp 15 | Wt 180.5 lb

## 2022-07-18 DIAGNOSIS — D500 Iron deficiency anemia secondary to blood loss (chronic): Secondary | ICD-10-CM

## 2022-07-18 DIAGNOSIS — Z7901 Long term (current) use of anticoagulants: Secondary | ICD-10-CM | POA: Diagnosis not present

## 2022-07-18 DIAGNOSIS — I509 Heart failure, unspecified: Secondary | ICD-10-CM | POA: Insufficient documentation

## 2022-07-18 DIAGNOSIS — Z803 Family history of malignant neoplasm of breast: Secondary | ICD-10-CM | POA: Insufficient documentation

## 2022-07-18 DIAGNOSIS — D509 Iron deficiency anemia, unspecified: Secondary | ICD-10-CM | POA: Insufficient documentation

## 2022-07-18 DIAGNOSIS — Z806 Family history of leukemia: Secondary | ICD-10-CM | POA: Diagnosis not present

## 2022-07-18 DIAGNOSIS — Z79899 Other long term (current) drug therapy: Secondary | ICD-10-CM | POA: Diagnosis not present

## 2022-07-18 DIAGNOSIS — I4891 Unspecified atrial fibrillation: Secondary | ICD-10-CM | POA: Diagnosis not present

## 2022-07-18 DIAGNOSIS — Z801 Family history of malignant neoplasm of trachea, bronchus and lung: Secondary | ICD-10-CM | POA: Diagnosis not present

## 2022-07-18 DIAGNOSIS — Z800 Family history of malignant neoplasm of digestive organs: Secondary | ICD-10-CM | POA: Insufficient documentation

## 2022-07-18 DIAGNOSIS — I110 Hypertensive heart disease with heart failure: Secondary | ICD-10-CM | POA: Diagnosis not present

## 2022-07-18 NOTE — Progress Notes (Signed)
HEMATOLOGY/ONCOLOGY CLINIC VISIT NOTE  Date of Service: 07/18/22   Patient Care Team: [name], MD as PCP - General (Family Medicine)  CHIEF COMPLAINTS/PURPOSE OF CONSULTATION:  Follow-up for continued evaluation and management of iron deficiency anemia  HISTORY OF PRESENTING ILLNESS: See previous note for details  Interval history  Amanda Frazier is here for continued evaluation and management of iron deficiency anemia.  Patient was last seen by me on 01/17/2022 and was doing well without any medical concerns.   Patient reports she had her polyps removed in September. She denies of any side effects after her polyps surgery.   She reports her blood pressure has been elevated and low heart rate in this past month. She has been following up with her PCP and her Cardiologist for her elevated blood pressure and low heart rate. Patient reports she is doing well other than elevated blood pressure and low heart rate.  She denies fever, chills, dizziness, back pain, abdominal pain, abnormal bowl moments, and leg swelling.   She has received her influenza vaccine, RSV vaccine, and COVID-19 Booster.   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  SURGICAL HISTORY: Past Surgical History:  Procedure Laterality Date   BIOPSY  07/12/2021   Procedure: BIOPSY;  Surgeon: Wilford Corner, MD;  [facility]: [facility];  Service: Endoscopy;;   CARDIOVERSION  2021   COLONOSCOPY WITH PROPOFOL N/A 07/12/2021   Procedure: COLONOSCOPY WITH PROPOFOL;  Surgeon: Wilford Corner, MD;  [facility]: [facility];  Service: Endoscopy;  Laterality: N/A;   ESOPHAGOGASTRODUODENOSCOPY (EGD) WITH PROPOFOL N/A 07/12/2021   Procedure: ESOPHAGOGASTRODUODENOSCOPY (EGD) WITH  PROPOFOL;  Surgeon: Wilford Corner, MD;  [facility];  Service: Endoscopy;  Laterality: N/A;   POLYPECTOMY  07/12/2021   Procedure: POLYPECTOMY;  Surgeon: Wilford Corner, MD;  [facility];  Service: Endoscopy;;   REPLACEMENT TOTAL KNEE Left 2015   SCLEROTHERAPY  07/12/2021   Procedure: SCLEROTHERAPY;  Surgeon: Wilford Corner, MD;  [facility]: [facility];  Service: Endoscopy;;   SUBMUCOSAL TATTOO INJECTION  07/12/2021   Procedure: SUBMUCOSAL TATTOO INJECTION;  Surgeon: Wilford Corner, MD;  [facility];  Service: Endoscopy;;    SOCIAL HISTORY: Social History   Socioeconomic History   Marital status: Married    Spouse name: Not on file   Number of children: 2   Years of education: Not on file   Highest education level: Not on file  Occupational History   Not on file  Tobacco Use   Smoking status: Never   Smokeless tobacco: Never  Vaping Use   Vaping Use: Never used  Substance and Sexual Activity   Alcohol use: Never   Drug use: Never   Sexual activity: Not on file  Other Topics Concern   Not on file  Social History Narrative   Not on file   Social Determinants of Health   Financial Resource Strain: Not on file  Food Insecurity: Not on file  Transportation Needs: Not on file  Physical Activity: Not on file  Stress: Not on file  Social Connections: Not on file  Intimate Partner Violence: Not on file    FAMILY HISTORY: Family History  Problem Relation Age of Onset   Breast cancer Maternal Aunt    Atrial fibrillation  Mother    Hyperlipidemia Mother    Lung cancer Mother    Atrial fibrillation Father    Colon cancer Father    Breast cancer Sister    Leukemia Brother     ALLERGIES:  is allergic to celecoxib, fosamax [alendronate sodium], lipitor [atorvastatin calcium], valsartan-hydrochlorothiazide, benazepril, nickel, palladium chloride, simvastatin, and valsartan.  MEDICATIONS:  Current Outpatient Medications   Medication Sig Dispense Refill   amiodarone (PACERONE) 100 MG tablet TAKE 1 TABLET BY MOUTH EVERY DAY 90 tablet 3   amLODipine (NORVASC) 5 MG tablet TAKE 2 TABLETS BY MOUTH EVERY DAY 180 tablet 3   B Complex Vitamins (B-COMPLEX/B-12) LIQD Place under the tongue.     calcium carbonate (TUMS - DOSED IN MG ELEMENTAL CALCIUM) 500 MG chewable tablet Chew 1 tablet by mouth 2 (two) times daily.     Cholecalciferol (VITAMIN D3) 125 MCG (5000 UT) CAPS Take 5,000 Units by mouth daily.     ELIQUIS 5 MG TABS tablet TAKE 1 TABLET BY MOUTH TWICE A DAY 60 tablet 6   furosemide (LASIX) 20 MG tablet TAKE 1 TABLET (20 MG TOTAL) BY MOUTH AS NEEDED FOR FLUID OR EDEMA. 90 tablet 1   METAMUCIL FIBER PO Take 2 capsules by mouth daily as needed (regularity).     omeprazole (PRILOSEC OTC) 20 MG tablet Take 20 mg by mouth daily.     rosuvastatin (CRESTOR) 20 MG tablet TAKE 1 TABLET BY MOUTH EVERY DAY 90 tablet 1   No current facility-administered medications for this visit.    REVIEW OF SYSTEMS:   10 Point review of Systems was done is negative except as noted above.  PHYSICAL EXAMINATION:  .BP (!) 152/55 (BP Location: Left Arm, Patient Position: Sitting) Comment: Nurse notified  Pulse 60   Temp 98.2 F (36.8 C) (Oral)   Resp 15   Wt 180 lb 8 oz (81.9 kg)   SpO2 99%   BMI 31.97 kg/m  . GENERAL:alert, in no acute distress and comfortable SKIN: no acute rashes, no significant lesions EYES: conjunctiva are pink and non-injected, sclera anicteric OROPHARYNX: MMM, no exudates, no oropharyngeal erythema or ulceration NECK: supple, no JVD LYMPH:  no palpable lymphadenopathy in the cervical, axillary or inguinal regions LUNGS: clear to auscultation b/l with normal respiratory effort HEART: regular rate & rhythm ABDOMEN:  normoactive bowel sounds , non tender, not distended. Extremity: no pedal edema PSYCH: alert & oriented x 3 with fluent speech NEURO: no focal motor/sensory deficits   LABORATORY DATA:    .    Latest Ref Rng & Units 04/18/2022    1:06 PM 01/17/2022    1:25 PM 10/27/2021    3:45 PM  CBC  WBC 4.0 - 10.5 K/uL 3.9  4.4  5.2   Hemoglobin 12.0 - 15.0 g/dL 10.7  10.8  10.5   Hematocrit 36.0 - 46.0 % 31.9  32.5  33.0   Platelets 150 - 400 K/uL 160  179  199       Latest Ref Rng & Units 04/18/2022    1:06 PM 01/17/2022    1:25 PM 08/30/2021   10:55 AM  CMP  Glucose 70 - 99 mg/dL 95  123  91   BUN 8 - 23 mg/dL '25  29  24   '$ Creatinine 0.44 - 1.00 mg/dL 1.33  1.30  1.30   Sodium 135 - 145 mmol/L 139  139  140   Potassium 3.5 - 5.1 mmol/L 4.5  4.3  4.1   Chloride 98 -  111 mmol/L 105  106  109   CO2 22 - 32 mmol/L '29  29  27   '$ Calcium 8.9 - 10.3 mg/dL 9.6  9.3  9.3   Total Protein 6.5 - 8.1 g/dL 6.7  6.9  6.8   Total Bilirubin 0.3 - 1.2 mg/dL 0.9  0.6  0.8   Alkaline Phos 38 - 126 U/L 59  59  52   AST 15 - 41 U/L '18  21  17   '$ ALT 0 - 44 U/L '10  16  10     '$ Lab Results  Component Value Date   IRON 30 04/18/2022   TIBC 277 04/18/2022   IRONPCTSAT 11 04/18/2022   (Iron and TIBC)  Lab Results  Component Value Date   FERRITIN 235 04/18/2022   Final Pathologic Diagnosis      A. STOMACH, POLYPS, POLYPECTOMY:  Hyperplastic polyps. Negative for H. pylori by immunohistochemical stain. Negative for intestinal metaplasia, dysplasia, or malignancy.    Electronically signed by Payton Spark, MD on 08/25/2021 at  1:15 PM    RADIOGRAPHIC STUDIES: I have personally reviewed the radiological images as listed and agreed with the findings in the report. Tissue Exam from 05/16/2022: Final Pathologic Diagnosis   POLYP, STOMACH, POLYPECTOMY:              Hyperplastic polyp with mucosal erosion and focal active inflammation.               No Helicobacter-like organisms are identified on H&E stained slides.              Negative for intestinal metaplasia, dyplasia and malignancy.    ASSESSMENT & PLAN:   76 year old female with  #1 Normocytic/microcytic Anemia -due to  severe iron deficiency.  #2 Severe iron deficiency. Likely due to GI bleeding from gastric polyps in the context of being on anticoagulation . Patient also has been on chronic PPI therapy for more than 20 years which would reduce oral iron absorption from food.  #3  History of gastric polyps with a bleeding Patient has had resection of her remaining gastric polyps at [facility] on 08/19/2021.  Pathology is negative for Helicobacter pylori and showed hyperplastic polyps.  #4 Atrial fibrillation on Eliquis  PLAN: -Patient notes no overt visible GI bleeding since her last clinic visit. -Waiting on lab results from her PCP office. -Wait for lab results from PCP and then decide if she needs to continue IV Venofer.   -continue vitamin B complex to support accelerated hematopoiesis.  Follow-up: RTC with Dr Irene Limbo with labs in 6 months  The total time spent in the appointment was 20 minutes* .  All of the patient's questions were answered with apparent satisfaction. The patient knows to call the clinic with any problems, questions or concerns.   Sullivan Lone MD MS AAHIVMS [facility] Hematology/Oncology Physician [facility]  .*Total Encounter Time as defined by the Centers for Medicare and Medicaid Services includes, in addition to the face-to-face time of a patient visit (documented in the note above) non-face-to-face time: obtaining and reviewing outside history, ordering and reviewing medications, tests or procedures, care coordination (communications with other health care professionals or caregivers) and documentation in the medical record.   I, Cleda Mccreedy, am acting as a Education administrator for Sullivan Lone, MD. .I have reviewed the above documentation for accuracy and completeness, and I agree with the above. Brunetta Genera MD

## 2022-07-19 ENCOUNTER — Encounter: Payer: Self-pay | Admitting: Cardiology

## 2022-07-19 ENCOUNTER — Ambulatory Visit: Payer: Medicare HMO | Admitting: Student

## 2022-07-19 ENCOUNTER — Ambulatory Visit: Payer: Medicare HMO | Admitting: Cardiology

## 2022-07-19 VITALS — BP 152/70 | HR 62 | Ht 63.0 in | Wt 179.0 lb

## 2022-07-19 DIAGNOSIS — N1831 Chronic kidney disease, stage 3a: Secondary | ICD-10-CM

## 2022-07-19 DIAGNOSIS — I480 Paroxysmal atrial fibrillation: Secondary | ICD-10-CM

## 2022-07-19 DIAGNOSIS — I10 Essential (primary) hypertension: Secondary | ICD-10-CM

## 2022-07-19 MED ORDER — LOSARTAN POTASSIUM 25 MG PO TABS: 25.0000 mg | ORAL_TABLET | Freq: Every evening | ORAL | 2 refills | Status: DC

## 2022-07-19 NOTE — Progress Notes (Unsigned)
Primary Physician/Referring:  [name], MD  Patient ID: Amanda Frazier, female    DOB: [birth year], 76 y.o.   MRN: [id number]  Chief Complaint  Patient presents with   Follow-up    6 month   Hyperlipidemia   Hypertension   HPI:    Amanda Frazier  is a 76 y.o. Caucasian female with hypertension, hyperlipidemia, hyperglycemia seen in the emergency room on 10/01/2019 for atrial fibrillation.  She was admitted to the hospital on 03/30/2020 with Covid pneumonia, and again admitted on 05/10/2020 for A. fib with RVR.  She was recently seen in the emergency department at [facility] 05/16/2021 in A. fib with RVR and heart failure, and underwent successful cardioversion on 05/18/2020.    Patient was last seen in our office 07/18/2021 at which time increase Crestor from 10 mg to 20 mg, repeat lipid profile was well controlled with LDL 85.  Patient now presents for 77-monthfollow-up.  Patient is feeling quite well without complaints today.  She continues to follow with GI and hematology for management and work-up of anemia.  She remains on Eliquis without significant bleeding diathesis.  She continues to walk regularly and exercises 2 to 3 days/week without issue.  Reports her energy level is quite good.  She monitors her blood pressure regularly at home reporting average readings 135/60s mmHg.  She has had no known recurrence of atrial fibrillation and has not needed Lasix in the last several months.  She remains careful with her diet.  Denies chest pain, dyspnea, palpitations, orthopnea, PND, leg edema.  Past Medical History:  Diagnosis Date   Atrial fibrillation ([place]    Hyperlipidemia    Hypertension    Family History  Problem Relation Age of Onset   Breast cancer Maternal Aunt    Atrial fibrillation Mother    Hyperlipidemia Mother    Lung cancer Mother    Atrial fibrillation Father    Colon cancer Father    Breast cancer Sister    Leukemia Brother    Past Surgical History:   Procedure Laterality Date   BIOPSY  07/12/2021   Procedure: BIOPSY;  Surgeon: SWilford Corner MD;  [facility]: [facility];  Service: Endoscopy;;   CARDIOVERSION  2021   COLONOSCOPY WITH PROPOFOL N/A 07/12/2021   Procedure: COLONOSCOPY WITH PROPOFOL;  Surgeon: SWilford Corner MD;  [facility];  Service: Endoscopy;  Laterality: N/A;   ESOPHAGOGASTRODUODENOSCOPY (EGD) WITH PROPOFOL N/A 07/12/2021   Procedure: ESOPHAGOGASTRODUODENOSCOPY (EGD) WITH PROPOFOL;  Surgeon: SWilford Corner MD;  [facility];  Service: Endoscopy;  Laterality: N/A;   POLYPECTOMY  07/12/2021   Procedure: POLYPECTOMY;  Surgeon: SWilford Corner MD;  [facility];  Service: Endoscopy;;   REPLACEMENT TOTAL KNEE Left 2015   SCLEROTHERAPY  07/12/2021   Procedure: SCLEROTHERAPY;  Surgeon: SWilford Corner MD;  [facility];  Service: Endoscopy;;   SUBMUCOSAL TATTOO INJECTION  07/12/2021   Procedure: SUBMUCOSAL TATTOO INJECTION;  Surgeon: SWilford Corner MD;  [facility];  Service: Endoscopy;;   Social History   Tobacco Use   Smoking status: Never   Smokeless tobacco: Never  Substance Use Topics   Alcohol use: Never  Marital Status: Married  ROS  Review of Systems  Cardiovascular:  Negative for chest pain, dyspnea on exertion and leg swelling.    Objective  Blood pressure (!) 152/70, pulse 62, height _0  (1.6 m), weight 179 lb (81.2 kg), SpO2 97 %.     07/19/2022   11:53 AM 07/18/2022  1:24 PM 02/03/2022   12:26 PM  Vitals with BMI  Height _0     Weight 179 lbs 180 lbs 8 oz   BMI 12.24    Systolic 825 003 704  Diastolic 70 55 39  Pulse 62 60 57     Physical Exam Neck:     Vascular: No carotid bruit or JVD.  Cardiovascular:     Rate and Rhythm: Normal rate and regular rhythm.     Pulses: Intact distal pulses.     Heart sounds: Murmur heard.     Early systolic murmur is present with a grade of 2/6 at the upper right sternal border.      No gallop.  Pulmonary:     Effort: Pulmonary effort is normal.     Breath sounds: Normal breath sounds.  Abdominal:     General: Bowel sounds are normal.     Palpations: Abdomen is soft.  Musculoskeletal:     Right lower leg: No edema.     Left lower leg: No edema.    Laboratory examination:   Lab Results  Component Value Date   NA 139 04/18/2022   K 4.5 04/18/2022   CO2 29 04/18/2022   GLUCOSE 95 04/18/2022   BUN 25 (H) 04/18/2022   CREATININE 1.33 (H) 04/18/2022   CALCIUM 9.6 04/18/2022   EGFR 41 (L) 01/14/2021   GFRNONAA 41 (L) 04/18/2022       Latest Ref Rng & Units 04/18/2022    1:06 PM 01/17/2022    1:25 PM 08/30/2021   10:55 AM  CMP  Glucose 70 - 99 mg/dL 95  123  91   BUN 8 - 23 mg/dL _1 Creatinine 0.44 - 1.00 mg/dL 1.33  1.30  1.30   Sodium 135 - 145 mmol/L 139  139  140   Potassium 3.5 - 5.1 mmol/L 4.5  4.3  4.1   Chloride 98 - 111 mmol/L 105  106  109   CO2 22 - 32 mmol/L _2 Calcium 8.9 - 10.3 mg/dL 9.6  9.3  9.3   Total Protein 6.5 - 8.1 g/dL 6.7  6.9  6.8   Total Bilirubin 0.3 - 1.2 mg/dL 0.9  0.6  0.8   Alkaline Phos 38 - 126 U/L 59  59  52   AST 15 - 41 U/L _3 ALT 0 - 44 U/L _4 Latest Ref Rng & Units 04/18/2022    1:06 PM 01/17/2022    1:25 PM 10/27/2021    3:45 PM  CBC  WBC 4.0 - 10.5 K/uL 3.9  4.4  5.2   Hemoglobin 12.0 - 15.0 g/dL 10.7  10.8  10.5   Hematocrit 36.0 - 46.0 % 31.9  32.5  33.0   Platelets 150 - 400 K/uL 160  179  199    Lipid Panel     Component Value Date/Time   CHOL 160 08/08/2021 1019   TRIG 93 08/08/2021 1019   HDL 58 08/08/2021 1019   LDLCALC 85 08/08/2021 1019   HEMOGLOBIN A1C No results found for: "HGBA1C", "MPG" TSH Recent Labs    07/19/21 1419  TSH 2.122    External labs:  06/23/2021: HDL 59, LDL 114, total cholesterol 190, triglycerides 95 A1c 4.7% BUN 24, creatinine 1.35, GFR 41 TSH 3.57  06/10/2020: HDL 43, LDL 90, total cholesterol 133, triglycerides  105 A1c 5.2% BUN 9.0, creatinine 1.23, EGFR 43 TSH 4.79  12/09/2019: BUN 22, SrCr 1.23  10/29/2019: TSH 2.06  Labs 10/01/2019: Hb 12.7/HCT 40.0, platelets 324.  Serum glucose 116 mg, BUN 18, creatinine 1.09, EGFR 50 mL.  Calcium minimally elevated at 10.4, CMP otherwise normal.    05/20/2019: HDL 53, LDL 103, triglycerides 121, total 180  Allergies   Allergies  Allergen Reactions   Celecoxib Nausea And Vomiting   Fosamax [Alendronate Sodium] Nausea And Vomiting   Lipitor [Atorvastatin Calcium]     Fatigue   Valsartan-Hydrochlorothiazide     Other reaction(s): cough   Benazepril     Other reaction(s): Cough   Nickel     Other reaction(s): Other (See Comments) Positive patch test   Palladium Chloride     Other reaction(s): Other (See Comments) Positive patch test   Simvastatin Diarrhea    Other reaction(s): diarrhea   Valsartan     Other reaction(s): Cough     Medications   Current Outpatient Medications:    amLODipine (NORVASC) 5 MG tablet, TAKE 2 TABLETS BY MOUTH EVERY DAY, Disp: 180 tablet, Rfl: 3   B Complex Vitamins (B-COMPLEX/B-12) LIQD, Place under the tongue., Disp: , Rfl:    calcium carbonate (TUMS - DOSED IN MG ELEMENTAL CALCIUM) 500 MG chewable tablet, Chew 1 tablet by mouth 2 (two) times daily., Disp: , Rfl:    Cholecalciferol (VITAMIN D3) 125 MCG (5000 UT) CAPS, Take 5,000 Units by mouth daily., Disp: , Rfl:    ELIQUIS 5 MG TABS tablet, TAKE 1 TABLET BY MOUTH TWICE A DAY, Disp: 60 tablet, Rfl: 6   furosemide (LASIX) 20 MG tablet, TAKE 1 TABLET (20 MG TOTAL) BY MOUTH AS NEEDED FOR FLUID OR EDEMA., Disp: 90 tablet, Rfl: 1   losartan (COZAAR) 25 MG tablet, Take 1 tablet (25 mg total) by mouth every evening., Disp: 30 tablet, Rfl: 2   METAMUCIL FIBER PO, Take 2 capsules by mouth daily as needed (regularity)., Disp: , Rfl:    omeprazole (PRILOSEC) 20 MG capsule, Take 1 capsule by mouth daily., Disp: , Rfl:    rosuvastatin (CRESTOR) 20 MG tablet, TAKE 1 TABLET BY  MOUTH EVERY DAY, Disp: 90 tablet, Rfl: 1   amiodarone (PACERONE) 100 MG tablet, Take 1 tablet (100 mg total) by mouth every other day., Disp: 90 tablet, Rfl: 3   Radiology:   Chest x-ray PA and lateral view 09/30/2019: There is minimal scarring versus atelectasis on the lateral left lung base. No focal infiltrate is seen. There is no pleural effusion or pneumothorax. Cardiac silhouette is borderline enlarged. Mediastinal contours are normal. There are mild degenerative changes at the shoulders and throughout the spine. Impression:  Borderline cardiomegaly. No visualized acute abnormality.   Chest x-ray AP 05/16/2020:  Stable left greater than right effusions and bibasilar atelectasis. Cannot exclude developing infection.   Cardiac Studies:   Lexiscan (Walking with mod Bruce)Tetrofosmin Stress Test  10/27/2019:  Nondiagnostic ECG stress. Inferior and inferolateral breast tissue attenuation noted. Myocardial perfusion is normal. Overall LV systolic function is normal without regional wall motion abnormalities. Stress LV EF: 55%.  No previous exam available for comparison. Low risk study.   PCV ECHOCARDIOGRAM LIMITED 07/29/2020 Left ventricle cavity is normal in size. Mild concentric hypertrophy of the left ventricle. Normal global wall motion. Normal LV systolic function with visual EF 50-55%. Doppler evidence of grade I (impaired) diastolic dysfunction, elevated LAP. Left atrial cavity is at least moderately dilated. Moderate (Grade III) mitral regurgitation. Mild  to moderate tricuspid regurgitation. Estimated pulmonary artery systolic pressure 44 mmHg. Compared to previous study on 11/05/2019, pulmonary hypertension is new.  EKG   EKG 07/19/2021: Normal sinus rhythm at rate of 56 bpm, left axis deviation, left anterior fascicular block.  Incomplete right bundle branch block.  No evidence of ischemia.  Compared to 01/16/2022, no significant change.  10/01/2019: Atrial fibrillation with  rapid ventricular response with rate of 111 bpm, normal axis, nonspecific ST depression in V1 and V2, cannot exclude septal ischemia.  Low-voltage complexes.  Abnormal EKG.  Assessment     ICD-10-CM   1. Primary hypertension  I10 EKG 12-Lead    losartan (COZAAR) 25 MG tablet    High sensitivity CRP    2. Paroxysmal atrial fibrillation ([place]). CHA2DS2-VASc Score is 3.  Yearly risk of stroke: 3.2% (A, F, HTN)  I48.0 amiodarone (PACERONE) 100 MG tablet    3. Stage 3a chronic kidney disease (HCC)  N18.31 losartan (COZAAR) 25 MG tablet    High sensitivity CRP    Basic metabolic panel     Meds ordered this encounter  Medications   losartan (COZAAR) 25 MG tablet    Sig: Take 1 tablet (25 mg total) by mouth every evening.    Dispense:  30 tablet    Refill:  2    Medications Discontinued During This Encounter  Medication Reason   omeprazole (PRILOSEC OTC) 20 MG tablet Discontinued by provider   amiodarone (PACERONE) 100 MG tablet Reorder    This patients CHA2DS2-VASc Score 3 (A, F, HTN) and yearly risk of stroke 3.2%.   Recommendations:   KHAYA THEISSEN  is a 76 y.o. Caucasian female with hypertension, hyperlipidemia, hyperglycemia seen in the emergency room on 10/01/2019 for atrial fibrillation.  She was admitted to the hospital on 03/30/2020 with Covid pneumonia, and again admitted on 05/10/2020 for A. fib with RVR.  She was recently seen in the emergency department at [facility] 05/16/2021 in A. fib with RVR and heart failure, and underwent successful cardioversion on 05/18/2020. Etiology for atrial fibrillation includes uncontrolled hypertension, and obesity.    Patient was last seen in our office 07/18/2021 at which time increase Crestor from 10 mg to 20 mg, repeat lipid profile was well controlled with LDL 85.  Patient now presents for 71-monthfollow-up.  Patient is doing quite well and remains asymptomatic from a cardiovascular standpoint.  She continues to tolerate anticoagulation without  significant bleeding diathesis and anemia has been stable per GI and hematology.  Patient's blood pressure is mildly elevated in the office today, however she monitors it regularly at home and readings are well controlled.  Will not make changes to antihypertensive medications at this time.  Patient has had no known recurrence of atrial fibrillation.  Lipids are well controlled.  Continue amiodarone, amlodipine, Eliquis, rosuvastatin, and as needed Lasix.  Notably patient has had multiple medication intolerances both ACE inhibitor's and ARB use as well as renal dysfunction with hydrochlorothiazide. Patient and her PCP are aware she needs annual monitoring of PFTs, thyroid function, liver function, and ophthalmologic exam as she is being treated with amiodarone.  Follow up in 6 months, sooner if needed.    JAdrian Prows PA-C 07/19/2022, 12:46 PM Office: [phone]

## 2022-07-24 ENCOUNTER — Encounter: Payer: Self-pay | Admitting: Cardiology

## 2022-07-24 NOTE — Progress Notes (Signed)
Labs 07/17/2022: Hb 11.8/HCT 35.0, platelets 171.  Iron levels low.  Total cholesterol 188, triglycerides 90, HDL 62, LDL 110.  Serum glucose _0 mg, BUN 22, creatinine 1.50, EGFR 36,, potassium 4.2.  LFTs normal.  TSH normal at 3.60.

## 2022-07-25 ENCOUNTER — Encounter: Payer: Self-pay | Admitting: Hematology

## 2022-08-17 LAB — BASIC METABOLIC PANEL
BUN/Creatinine Ratio: 17 (ref 12–28)
BUN: 26 mg/dL (ref 8–27)
CO2: 22 mmol/L (ref 20–29)
Calcium: 9.6 mg/dL (ref 8.7–10.3)
Chloride: 103 mmol/L (ref 96–106)
Creatinine, Ser: 1.53 mg/dL — ABNORMAL HIGH (ref 0.57–1.00)
Glucose: 97 mg/dL (ref 70–99)
Potassium: 4.8 mmol/L (ref 3.5–5.2)
Sodium: 140 mmol/L (ref 134–144)
eGFR: 35 mL/min/{1.73_m2} — ABNORMAL LOW (ref >59)

## 2022-08-17 LAB — HIGH SENSITIVITY CRP: CRP, High Sensitivity: 1.8 mg/L (ref 0.00–3.00)

## 2022-10-16 ENCOUNTER — Other Ambulatory Visit: Payer: Self-pay | Admitting: Cardiology

## 2022-10-16 DIAGNOSIS — I10 Essential (primary) hypertension: Secondary | ICD-10-CM

## 2022-10-16 DIAGNOSIS — N1831 Chronic kidney disease, stage 3a: Secondary | ICD-10-CM

## 2022-10-19 ENCOUNTER — Other Ambulatory Visit: Payer: Self-pay

## 2022-10-19 DIAGNOSIS — I480 Paroxysmal atrial fibrillation: Secondary | ICD-10-CM

## 2022-10-19 MED ORDER — APIXABAN 5 MG PO TABS: 5.0000 mg | ORAL_TABLET | Freq: Two times a day (BID) | ORAL | 1 refills | Status: DC

## 2022-12-19 ENCOUNTER — Other Ambulatory Visit: Payer: Self-pay | Admitting: Cardiology

## 2022-12-19 DIAGNOSIS — I480 Paroxysmal atrial fibrillation: Secondary | ICD-10-CM

## 2023-01-02 ENCOUNTER — Encounter: Payer: Self-pay | Admitting: Hematology

## 2023-01-16 ENCOUNTER — Other Ambulatory Visit: Payer: Self-pay

## 2023-01-16 ENCOUNTER — Inpatient Hospital Stay (HOSPITAL_BASED_OUTPATIENT_CLINIC_OR_DEPARTMENT_OTHER): Payer: Medicare HMO | Admitting: Hematology

## 2023-01-16 ENCOUNTER — Inpatient Hospital Stay: Payer: Medicare HMO | Attending: Hematology

## 2023-01-16 VITALS — BP 146/50 | HR 57 | Temp 98.0°F | Resp 18 | Ht 63.0 in | Wt 182.8 lb

## 2023-01-16 DIAGNOSIS — Z79899 Other long term (current) drug therapy: Secondary | ICD-10-CM | POA: Insufficient documentation

## 2023-01-16 DIAGNOSIS — D509 Iron deficiency anemia, unspecified: Secondary | ICD-10-CM | POA: Insufficient documentation

## 2023-01-16 DIAGNOSIS — Z7901 Long term (current) use of anticoagulants: Secondary | ICD-10-CM | POA: Insufficient documentation

## 2023-01-16 DIAGNOSIS — I4891 Unspecified atrial fibrillation: Secondary | ICD-10-CM | POA: Diagnosis not present

## 2023-01-16 DIAGNOSIS — D500 Iron deficiency anemia secondary to blood loss (chronic): Secondary | ICD-10-CM | POA: Diagnosis not present

## 2023-01-16 LAB — CBC WITH DIFFERENTIAL (CANCER CENTER ONLY)
Abs Immature Granulocytes: 0.01 10*3/uL (ref 0.00–0.07)
Basophils Absolute: 0 10*3/uL (ref 0.0–0.1)
Basophils Relative: 0 %
Eosinophils Absolute: 0.1 10*3/uL (ref 0.0–0.5)
Eosinophils Relative: 1 %
HCT: 33.3 % — ABNORMAL LOW (ref 36.0–46.0)
Hemoglobin: 10.8 g/dL — ABNORMAL LOW (ref 12.0–15.0)
Immature Granulocytes: 0 %
Lymphocytes Relative: 27 %
Lymphs Abs: 1.3 10*3/uL (ref 0.7–4.0)
MCH: 29.8 pg (ref 26.0–34.0)
MCHC: 32.4 g/dL (ref 30.0–36.0)
MCV: 91.7 fL (ref 80.0–100.0)
Monocytes Absolute: 0.4 10*3/uL (ref 0.1–1.0)
Monocytes Relative: 8 %
Neutro Abs: 3.1 10*3/uL (ref 1.7–7.7)
Neutrophils Relative %: 64 %
Platelet Count: 200 10*3/uL (ref 150–400)
RBC: 3.63 MIL/uL — ABNORMAL LOW (ref 3.87–5.11)
RDW: 12.5 % (ref 11.5–15.5)
WBC Count: 4.9 10*3/uL (ref 4.0–10.5)
nRBC: 0 % (ref 0.0–0.2)

## 2023-01-16 LAB — IRON AND IRON BINDING CAPACITY (CC-WL,HP ONLY)
Iron: 40 ug/dL (ref 28–170)
Saturation Ratios: 14 % (ref 10.4–31.8)
TIBC: 291 ug/dL (ref 250–450)
UIBC: 251 ug/dL (ref 148–442)

## 2023-01-16 LAB — CMP (CANCER CENTER ONLY)
ALT: 9 U/L (ref 0–44)
AST: 17 U/L (ref 15–41)
Albumin: 4.2 g/dL (ref 3.5–5.0)
Alkaline Phosphatase: 59 U/L (ref 38–126)
Anion gap: 6 (ref 5–15)
BUN: 26 mg/dL — ABNORMAL HIGH (ref 8–23)
CO2: 26 mmol/L (ref 22–32)
Calcium: 9.5 mg/dL (ref 8.9–10.3)
Chloride: 106 mmol/L (ref 98–111)
Creatinine: 1.4 mg/dL — ABNORMAL HIGH (ref 0.44–1.00)
GFR, Estimated: 39 mL/min — ABNORMAL LOW (ref >60)
Glucose, Bld: 100 mg/dL — ABNORMAL HIGH (ref 70–99)
Potassium: 4.3 mmol/L (ref 3.5–5.1)
Sodium: 138 mmol/L (ref 135–145)
Total Bilirubin: 0.9 mg/dL (ref 0.3–1.2)
Total Protein: 7.3 g/dL (ref 6.5–8.1)

## 2023-01-16 LAB — VITAMIN B12: Vitamin B-12: 3027 pg/mL — ABNORMAL HIGH (ref 180–914)

## 2023-01-16 LAB — FERRITIN: Ferritin: 178 ng/mL (ref 11–307)

## 2023-01-16 NOTE — Progress Notes (Signed)
HEMATOLOGY/ONCOLOGY CLINIC VISIT NOTE  Date of Service: 01/16/23    Patient Care Team: Mila Palmer, MD as PCP - General (Family Medicine)  CHIEF COMPLAINTS/PURPOSE OF CONSULTATION:  Follow-up for continued evaluation and management of iron deficiency anemia  HISTORY OF PRESENTING ILLNESS: See previous note for details  Interval history  Amanda Frazier is here for continued evaluation and management of iron deficiency anemia.  Patient was last seen by me on 07/18/2022 and she was doing well overall. She did report elevated blood pressure and low heart rate.   Patient reports she has been doing well overall without any new or severe medical concerns since our last visit. She notes she had 3 polyps surgically removed in September. She has discontinued Prilosec 20 mg since our last visit because her physician believed Prilosec was causing polyps. She is now taking Pepcid instead of Prilosec 20 mg.   Patient complains of acid reflux after she changed Prilosec to Pepcid. She has been taking Tums and eating ice cream to resolve her acid reflux.   She regularly follows-up with her Cardiologist regarding elevated blood pressure. Her cardiologist has changed her Losartan dosage to 50 mg. Her blood pressure is elevated during this visit at 146/50.   Patient continues to follow-up with her Nephrologist as well.  She denies fever, chills, night sweats, infection issues, unexpected weight loss, abdominal pain, chest pain, back pain, shortness of breath, or leg swelling.    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  SURGICAL HISTORY: Past Surgical History:  Procedure Laterality Date   BIOPSY  07/12/2021   Procedure: BIOPSY;  Surgeon: Charlott Rakes, MD;  [facility];  Service: Endoscopy;;   CARDIOVERSION  2021   COLONOSCOPY WITH PROPOFOL N/A 07/12/2021   Procedure: COLONOSCOPY WITH PROPOFOL;  Surgeon: Charlott Rakes, MD;  [facility];  Service: Endoscopy;  Laterality: N/A;   ESOPHAGOGASTRODUODENOSCOPY (EGD) WITH PROPOFOL N/A 07/12/2021   Procedure: ESOPHAGOGASTRODUODENOSCOPY (EGD) WITH PROPOFOL;  Surgeon: Charlott Rakes, MD;  [facility];  Service: Endoscopy;  Laterality: N/A;   POLYPECTOMY  07/12/2021   Procedure: POLYPECTOMY;  Surgeon: Charlott Rakes, MD;  [facility];  Service: Endoscopy;;   REPLACEMENT TOTAL KNEE Left 2015   SCLEROTHERAPY  07/12/2021   Procedure: SCLEROTHERAPY;  Surgeon: Charlott Rakes, MD;  [facility];  Service: Endoscopy;;   SUBMUCOSAL TATTOO INJECTION  07/12/2021   Procedure: SUBMUCOSAL TATTOO INJECTION;  Surgeon: Charlott Rakes, MD;  [facility];  Service: Endoscopy;;    SOCIAL HISTORY: Social History   Socioeconomic History   Marital status: Married    Spouse name: Not on file   Number of children: 2   Years of education: Not on file   Highest education level: Not on file  Occupational History   Not on file  Tobacco Use   Smoking status: Never   Smokeless tobacco: Never  Vaping Use   Vaping Use: Never used  Substance and Sexual Activity   Alcohol use: Never   Drug use: Never   Sexual activity: Not on file  Other Topics Concern   Not on file  Social History Narrative   Not on file   Social Determinants of Health   Financial Resource Strain: Not on file  Food Insecurity: Not on file  Transportation Needs: Not on file  Physical Activity: Not on file  Stress: Not on file  Social Connections: Not on file  Intimate Partner Violence: Not on file    FAMILY HISTORY: Family History  Problem Relation Age of Onset   Breast cancer Maternal Aunt    Atrial fibrillation Mother    Hyperlipidemia Mother     Lung cancer Mother    Atrial fibrillation Father    Colon cancer Father    Breast cancer Sister    Leukemia Brother     ALLERGIES:  is allergic to celecoxib, fosamax [alendronate sodium], lipitor [atorvastatin calcium], valsartan-hydrochlorothiazide, benazepril, nickel, palladium chloride, simvastatin, and valsartan.  MEDICATIONS:  Current Outpatient Medications  Medication Sig Dispense Refill   amiodarone (PACERONE) 100 MG tablet TAKE 1 TABLET BY MOUTH EVERY DAY 90 tablet 3   amLODipine (NORVASC) 5 MG tablet TAKE 2 TABLETS BY MOUTH EVERY DAY 180 tablet 3   B Complex Vitamins (B-COMPLEX/B-12) LIQD Place under the tongue.     calcium carbonate (TUMS - DOSED IN MG ELEMENTAL CALCIUM) 500 MG chewable tablet Chew 1 tablet by mouth 2 (two) times daily.     Cholecalciferol (VITAMIN D3) 125 MCG (5000 UT) CAPS Take 5,000 Units by mouth daily.     ELIQUIS 5 MG TABS tablet TAKE 1 TABLET BY MOUTH TWICE A DAY 60 tablet 6   furosemide (LASIX) 20 MG tablet TAKE 1 TABLET (20 MG TOTAL) BY MOUTH AS NEEDED FOR FLUID OR EDEMA. 90 tablet 1   METAMUCIL FIBER PO Take 2 capsules by mouth daily as needed (regularity).     omeprazole (PRILOSEC OTC) 20 MG tablet Take 20 mg by mouth daily.     rosuvastatin (CRESTOR) 20 MG tablet TAKE 1 TABLET BY MOUTH EVERY DAY 90 tablet 1   No current facility-administered medications for this visit.    REVIEW OF SYSTEMS:   10 Point review of Systems was done is negative except as noted above.  PHYSICAL EXAMINATION:  .BP (!) 152/55 (BP Location: Left Arm, Patient Position: Sitting) Comment: Nurse notified  Pulse 60   Temp 98.2 F (36.8 C) (Oral)   Resp 15   Wt 180 lb 8 oz (81.9 kg)   SpO2 99%   BMI 31.97 kg/m  . GENERAL:alert, in no acute distress and comfortable SKIN: no acute rashes, no significant lesions EYES: conjunctiva are pink and non-injected, sclera anicteric OROPHARYNX: MMM, no exudates, no oropharyngeal erythema or ulceration NECK: supple, no JVD LYMPH:   no palpable lymphadenopathy in the cervical, axillary or inguinal regions LUNGS: clear to auscultation b/l with normal respiratory effort HEART: regular rate & rhythm ABDOMEN:  normoactive bowel sounds , non tender, not distended. Extremity: no pedal edema PSYCH: alert & oriented x 3 with fluent speech NEURO: no focal motor/sensory deficits   LABORATORY DATA:   .    Latest Ref Rng & Units 01/16/2023   11:20 AM 04/18/2022    1:06 PM 01/17/2022    1:25 PM  CBC  WBC 4.0 - 10.5 K/uL 4.9  3.9  4.4   Hemoglobin 12.0 - 15.0 g/dL 16.1  09.6  04.5   Hematocrit 36.0 - 46.0 % 33.3  31.9  32.5   Platelets 150 - 400 K/uL 200  160  179    .CBC    Component Value Date/Time   WBC 4.9 01/16/2023 1120   WBC 5.2 10/27/2021 1545   RBC 3.63 (L) 01/16/2023 1120   HGB 10.8 (L) 01/16/2023  1120   HGB 10.4 (L) 08/07/2014 0540   HCT 33.3 (L) 01/16/2023 1120   HCT 25.0 (L) 07/19/2021 1419   PLT 200 01/16/2023 1120   PLT 274 08/07/2014 0540   MCV 91.7 01/16/2023 1120   MCV 83 07/22/2014 1459   MCH 29.8 01/16/2023 1120   MCHC 32.4 01/16/2023 1120   RDW 12.5 01/16/2023 1120   RDW 14.8 (H) 07/22/2014 1459   LYMPHSABS 1.3 01/16/2023 1120   MONOABS 0.4 01/16/2023 1120   EOSABS 0.1 01/16/2023 1120   BASOSABS 0.0 01/16/2023 1120       Latest Ref Rng & Units 01/16/2023   11:20 AM 08/16/2022   10:17 AM 04/18/2022    1:06 PM  CMP  Glucose 70 - 99 mg/dL 161  97  95   BUN 8 - 23 mg/dL 26  26  25    Creatinine 0.44 - 1.00 mg/dL 0.96  0.45  4.09   Sodium 135 - 145 mmol/L 138  140  139   Potassium 3.5 - 5.1 mmol/L 4.3  4.8  4.5   Chloride 98 - 111 mmol/L 106  103  105   CO2 22 - 32 mmol/L 26  22  29    Calcium 8.9 - 10.3 mg/dL 9.5  9.6  9.6   Total Protein 6.5 - 8.1 g/dL 7.3   6.7   Total Bilirubin 0.3 - 1.2 mg/dL 0.9   0.9   Alkaline Phos 38 - 126 U/L 59   59   AST 15 - 41 U/L 17   18   ALT 0 - 44 U/L 9   10     Lab Results  Component Value Date   IRON 40 01/16/2023   TIBC 291 01/16/2023    IRONPCTSAT 14 01/16/2023   (Iron and TIBC)  Lab Results  Component Value Date   FERRITIN 178 01/16/2023   Final Pathologic Diagnosis      A. STOMACH, POLYPS, POLYPECTOMY:  Hyperplastic polyps. Negative for H. pylori by immunohistochemical stain. Negative for intestinal metaplasia, dysplasia, or malignancy.    Electronically signed by Allyn Kenner, MD on 08/25/2021 at  1:15 PM    RADIOGRAPHIC STUDIES: I have personally reviewed the radiological images as listed and agreed with the findings in the report. Tissue Exam from 05/16/2022: Final Pathologic Diagnosis   POLYP, STOMACH, POLYPECTOMY:              Hyperplastic polyp with mucosal erosion and focal active inflammation.               No Helicobacter-like organisms are identified on H&E stained slides.              Negative for intestinal metaplasia, dyplasia and malignancy.    ASSESSMENT & PLAN:   77 year old female with  #1 Normocytic/microcytic Anemia -due to severe iron deficiency.  #2 Severe iron deficiency. Likely due to GI bleeding from gastric polyps in the context of being on anticoagulation . Patient also has been on chronic PPI therapy for more than 20 years which would reduce oral iron absorption from food.  #3  History of gastric polyps with a bleeding Patient has had resection of her remaining gastric polyps at [facility] on 08/19/2021.  Pathology is negative for Helicobacter pylori and showed hyperplastic polyps.  #4 Atrial fibrillation on Eliquis  PLAN: -Discussed lab results from today, 01/16/2023, with the patient. CBC shows decreased hemoglobin at 10.8 g/dL and decreased hematocrit at 33.3%. CMP shows elevated Bun at 26  and elevated creatinine at 1.40. Iron saturation rate at 14%.  Ferritin levels are 178 - Ferritin>100-- no indication fior IV iron at this time  Follow-up: RTC with Dr Candise Che with labs in 6 months  The total time spent in the appointment was 20 minutes*  .  All of the patient's questions were answered with apparent satisfaction. The patient knows to call the clinic with any problems, questions or concerns.   Wyvonnia Lora MD MS AAHIVMS [facility] Hematology/Oncology Physician [facility]  .*Total Encounter Time as defined by the Centers for Medicare and Medicaid Services includes, in addition to the face-to-face time of a patient visit (documented in the note above) non-face-to-face time: obtaining and reviewing outside history, ordering and reviewing medications, tests or procedures, care coordination (communications with other health care professionals or caregivers) and documentation in the medical record.   I, Ok Edwards, a acting as a Neurosurgeon for Wyvonnia Lora, MD. .I have reviewed the above documentation for accuracy and completeness, and I agree with the above. Johney Maine MD

## 2023-01-17 ENCOUNTER — Ambulatory Visit: Payer: Medicare HMO | Admitting: Cardiology

## 2023-01-17 ENCOUNTER — Encounter: Payer: Self-pay | Admitting: Cardiology

## 2023-01-17 VITALS — BP 130/73 | HR 69 | Ht 63.0 in | Wt 183.2 lb

## 2023-01-17 DIAGNOSIS — N1832 Chronic kidney disease, stage 3b: Secondary | ICD-10-CM

## 2023-01-17 DIAGNOSIS — I10 Essential (primary) hypertension: Secondary | ICD-10-CM

## 2023-01-17 DIAGNOSIS — I480 Paroxysmal atrial fibrillation: Secondary | ICD-10-CM

## 2023-01-17 DIAGNOSIS — K317 Polyp of stomach and duodenum: Secondary | ICD-10-CM

## 2023-01-17 DIAGNOSIS — K219 Gastro-esophageal reflux disease without esophagitis: Secondary | ICD-10-CM

## 2023-01-17 MED ORDER — SUCRALFATE 1 G PO TABS
1.0000 g | ORAL_TABLET | Freq: Three times a day (TID) | ORAL | 2 refills | Status: AC
Start: 2023-01-17 — End: ?

## 2023-01-17 MED ORDER — AMIODARONE HCL 100 MG PO TABS
100.0000 mg | ORAL_TABLET | ORAL | 3 refills | Status: DC
Start: 2023-01-17 — End: 2023-06-14

## 2023-01-17 NOTE — Progress Notes (Signed)
Primary Physician/Referring:  Mila Palmer, MD  Patient ID: Amanda Frazier, female    DOB: 08/29/1945, 77 y.o.   MRN: [id number]  Chief Complaint  Patient presents with   Paroxysmal atrial fibrillation (HCC). CHA2DS2-VASc Score is   Primary hypertension   Stage 3b chronic kidney disease (HCC)   Follow-up   HPI:    Amanda Frazier  is a 77 y.o. Caucasian female with hypertension, hyperlipidemia, hyperglycemia, PAF,  several episodes of A-fib with RVR needing either hospitalization or ED evaluations both for A-fib with RVR and diastolic heart failure and if she last successful cardioversion on 05/18/2020.  She also has chronic stage IIIa-b kidney disease, iron deficiency anemia due to gastric polyps and has had polypectomy at [facility] on 08/19/2021.  She is presently asymptomatic.  Her main issue has been GERD, acid reflux, due to patient's PPI being discontinued due to gastric polyps, she is now on Pepcid which has not been working well, otherwise she feels well.  No palpitations, no leg edema, no chest pain.    Past Medical History:  Diagnosis Date   Atrial fibrillation (HCC)    Hyperlipidemia    Hypertension    Family History  Problem Relation Age of Onset   Breast cancer Maternal Aunt    Atrial fibrillation Mother    Hyperlipidemia Mother    Lung cancer Mother    Atrial fibrillation Father    Colon cancer Father    Breast cancer Sister    Leukemia Brother    Past Surgical History:  Procedure Laterality Date   BIOPSY  07/12/2021   Procedure: BIOPSY;  Surgeon: Charlott Rakes, MD;  Location: [facility];  Service: Endoscopy;;   CARDIOVERSION  2021   COLONOSCOPY WITH PROPOFOL N/A 07/12/2021   Procedure: COLONOSCOPY WITH PROPOFOL;  Surgeon: Charlott Rakes, MD;  Location: [facility];  Service: Endoscopy;  Laterality: N/A;   ESOPHAGOGASTRODUODENOSCOPY (EGD) WITH PROPOFOL N/A 07/12/2021   Procedure: ESOPHAGOGASTRODUODENOSCOPY (EGD) WITH PROPOFOL;  Surgeon:  Charlott Rakes, MD;  Location: [facility];  Service: Endoscopy;  Laterality: N/A;   POLYPECTOMY  07/12/2021   Procedure: POLYPECTOMY;  Surgeon: Charlott Rakes, MD;  Location: [facility];  Service: Endoscopy;;   REPLACEMENT TOTAL KNEE Left 2015   SCLEROTHERAPY  07/12/2021   Procedure: SCLEROTHERAPY;  Surgeon: Charlott Rakes, MD;  Location: [facility];  Service: Endoscopy;;   SUBMUCOSAL TATTOO INJECTION  07/12/2021   Procedure: SUBMUCOSAL TATTOO INJECTION;  Surgeon: Charlott Rakes, MD;  Location: [facility];  Service: Endoscopy;;   Social History   Tobacco Use   Smoking status: Never   Smokeless tobacco: Never  Substance Use Topics   Alcohol use: Never  Marital Status: Married  ROS  Review of Systems  Cardiovascular:  Negative for chest pain, dyspnea on exertion and leg swelling.   Objective  Blood pressure 131/73, pulse 69, height 5\' 3"  (1.6 m), weight 183 lb 3.2 oz (83.1 kg), SpO2 97 %.     01/17/2023   10:42 AM 01/16/2023   11:58 AM 07/19/2022   11:53 AM  Vitals with BMI  Height 5\' 3"  5\' 3"  5\' 3"   Weight 183 lbs 3 oz 182 lbs 13 oz 179 lbs  BMI 32.46 32.39 31.72  Systolic 131 146 086  Diastolic 73 50 70  Pulse 69 57 62     Physical Exam Neck:     Vascular: No carotid bruit or JVD.  Cardiovascular:     Rate and Rhythm: Normal rate and regular rhythm.     Pulses:  Intact distal pulses.     Heart sounds: Murmur heard.     Early systolic murmur is present with a grade of 2/6 at the upper right sternal border.     No gallop.  Pulmonary:     Effort: Pulmonary effort is normal.     Breath sounds: Normal breath sounds.  Abdominal:     General: Bowel sounds are normal.     Palpations: Abdomen is soft.  Musculoskeletal:     Right lower leg: No edema.     Left lower leg: No edema.    Laboratory examination:   Lab Results  Component Value Date   NA 138 01/16/2023   K 4.3 01/16/2023   CO2 26 01/16/2023   GLUCOSE 100 (H) 01/16/2023   BUN 26 (H)  01/16/2023   CREATININE 1.40 (H) 01/16/2023   CALCIUM 9.5 01/16/2023   EGFR 35 (L) 08/16/2022   GFRNONAA 39 (L) 01/16/2023       Latest Ref Rng & Units 01/16/2023   11:20 AM 08/16/2022   10:17 AM 04/18/2022    1:06 PM  CMP  Glucose 70 - 99 mg/dL 696  97  95   BUN 8 - 23 mg/dL 26  26  25    Creatinine 0.44 - 1.00 mg/dL 2.95  2.84  1.32   Sodium 135 - 145 mmol/L 138  140  139   Potassium 3.5 - 5.1 mmol/L 4.3  4.8  4.5   Chloride 98 - 111 mmol/L 106  103  105   CO2 22 - 32 mmol/L 26  22  29    Calcium 8.9 - 10.3 mg/dL 9.5  9.6  9.6   Total Protein 6.5 - 8.1 g/dL 7.3   6.7   Total Bilirubin 0.3 - 1.2 mg/dL 0.9   0.9   Alkaline Phos 38 - 126 U/L 59   59   AST 15 - 41 U/L 17   18   ALT 0 - 44 U/L 9   10       Latest Ref Rng & Units 01/16/2023   11:20 AM 04/18/2022    1:06 PM 01/17/2022    1:25 PM  CBC  WBC 4.0 - 10.5 K/uL 4.9  3.9  4.4   Hemoglobin 12.0 - 15.0 g/dL 44.0  10.2  72.5   Hematocrit 36.0 - 46.0 % 33.3  31.9  32.5   Platelets 150 - 400 K/uL 200  160  179     External labs:    Labs 07/17/2022:   Hb 11.8/HCT 35.0, platelets 171.  Iron levels low.  Total cholesterol 188, triglycerides 90, HDL 62, LDL 110.  Serum glucose 1 1 2  mg, BUN 22, creatinine 1.50, EGFR 36,, potassium 4.2.  LFTs normal.  TSH normal at 3.60.  06/23/2021:  HDL 59, LDL 114, total cholesterol 190, triglycerides 95 A1c 4.7% BUN 24, creatinine 1.35, GFR 41 TSH 3.57  Allergies   Allergies  Allergen Reactions   Celecoxib Nausea And Vomiting   Fosamax [Alendronate Sodium] Nausea And Vomiting   Lipitor [Atorvastatin Calcium]     Fatigue   Valsartan-Hydrochlorothiazide     Other reaction(s): cough   Benazepril     Other reaction(s): Cough   Nickel     Other reaction(s): Other (See Comments) Positive patch test   Palladium Chloride     Other reaction(s): Other (See Comments) Positive patch test   Simvastatin Diarrhea    Other reaction(s): diarrhea   Valsartan     Other reaction(s):  Cough  Medications   Current Outpatient Medications:    amLODipine (NORVASC) 5 MG tablet, TAKE 2 TABLETS BY MOUTH EVERY DAY, Disp: 180 tablet, Rfl: 3   B Complex Vitamins (B-COMPLEX/B-12) LIQD, Place under the tongue., Disp: , Rfl:    calcium carbonate (TUMS - DOSED IN MG ELEMENTAL CALCIUM) 500 MG chewable tablet, Chew 1 tablet by mouth 2 (two) times daily., Disp: , Rfl:    Cholecalciferol (VITAMIN D3) 125 MCG (5000 UT) CAPS, Take 5,000 Units by mouth daily., Disp: , Rfl:    ELIQUIS 5 MG TABS tablet, TAKE 1 TABLET BY MOUTH TWICE A DAY, Disp: 60 tablet, Rfl: 1   famotidine (PEPCID) 20 MG tablet, Take 20 mg by mouth 2 (two) times daily., Disp: , Rfl:    furosemide (LASIX) 20 MG tablet, TAKE 1 TABLET (20 MG TOTAL) BY MOUTH AS NEEDED FOR FLUID OR EDEMA., Disp: 90 tablet, Rfl: 1   losartan (COZAAR) 50 MG tablet, Take 50 mg by mouth at bedtime., Disp: , Rfl:    METAMUCIL FIBER PO, Take 2 capsules by mouth daily as needed (regularity)., Disp: , Rfl:    rosuvastatin (CRESTOR) 20 MG tablet, TAKE 1 TABLET BY MOUTH EVERY DAY, Disp: 90 tablet, Rfl: 1   sucralfate (CARAFATE) 1 g tablet, Take 1 tablet (1 g total) by mouth 3 (three) times daily after meals., Disp: 90 tablet, Rfl: 2   amiodarone (PACERONE) 100 MG tablet, Take 1 tablet (100 mg total) by mouth every other day., Disp: 45 tablet, Rfl: 3   Radiology:   Chest x-ray PA and lateral view 09/30/2019: There is minimal scarring versus atelectasis on the lateral left lung base. No focal infiltrate is seen. There is no pleural effusion or pneumothorax. Cardiac silhouette is borderline enlarged. Mediastinal contours are normal. There are mild degenerative changes at the shoulders and throughout the spine. Impression:  Borderline cardiomegaly. No visualized acute abnormality.   Chest x-ray AP 05/16/2020:  Stable left greater than right effusions and bibasilar atelectasis. Cannot exclude developing infection.   Cardiac Studies:   Lexiscan (Walking  with mod Bruce)Tetrofosmin Stress Test  10/27/2019:  Nondiagnostic ECG stress. Inferior and inferolateral breast tissue attenuation noted. Myocardial perfusion is normal. Overall LV systolic function is normal without regional wall motion abnormalities. Stress LV EF: 55%.  No previous exam available for comparison. Low risk study.   PCV ECHOCARDIOGRAM LIMITED 07/29/2020 Left ventricle cavity is normal in size. Mild concentric hypertrophy of the left ventricle. Normal global wall motion. Normal LV systolic function with visual EF 50-55%. Doppler evidence of grade I (impaired) diastolic dysfunction, elevated LAP. Left atrial cavity is at least moderately dilated. Moderate (Grade III) mitral regurgitation. Mild to moderate tricuspid regurgitation. Estimated pulmonary artery systolic pressure 44 mmHg. Compared to previous study on 11/05/2019, pulmonary hypertension is new.  EKG   EKG 01/17/2023: Normal sinus rhythm at the rate of 56 bpm, incomplete right bundle branch block.  No evidence of ischemia.  No significant change from 07/19/2022.  10/01/2019: Atrial fibrillation with rapid ventricular response with rate of 111 bpm, normal axis, nonspecific ST depression in V1 and V2, cannot exclude septal ischemia.  Low-voltage complexes.  Abnormal EKG.  Assessment     ICD-10-CM   1. Paroxysmal atrial fibrillation (HCC). CHA2DS2-VASc Score is 3.  Yearly risk of stroke: 3.2% (A, F, HTN)  I48.0 EKG 12-Lead    amiodarone (PACERONE) 100 MG tablet    2. Primary hypertension  I10     3. Stage 3b chronic kidney disease (HCC)  N18.32  4. Gastric polyps  K31.7 sucralfate (CARAFATE) 1 g tablet    5. Gastroesophageal reflux disease without esophagitis  K21.9 sucralfate (CARAFATE) 1 g tablet     Meds ordered this encounter  Medications   sucralfate (CARAFATE) 1 g tablet    Sig: Take 1 tablet (1 g total) by mouth 3 (three) times daily after meals.    Dispense:  90 tablet    Refill:  2   amiodarone  (PACERONE) 100 MG tablet    Sig: Take 1 tablet (100 mg total) by mouth every other day.    Dispense:  45 tablet    Refill:  3    Medications Discontinued During This Encounter  Medication Reason   losartan (COZAAR) 25 MG tablet Dose change   omeprazole (PRILOSEC) 20 MG capsule    amiodarone (PACERONE) 100 MG tablet Reorder    This patients CHA2DS2-VASc Score 3 (A, F, HTN) and yearly risk of stroke 3.2%.   Recommendations:   Amanda Frazier  is a 77 y.o. Caucasian female with hypertension, hyperlipidemia, hyperglycemia, PAF,  several episodes of A-fib with RVR needing either hospitalization or ED evaluations both for A-fib with RVR and diastolic heart failure and if she last successful cardioversion on 05/18/2020.  She also has chronic stage IIIa-b kidney disease, iron deficiency anemia due to gastric polyps and has had polypectomy at [facility] on 08/19/2021.  1. Paroxysmal atrial fibrillation (HCC). CHA2DS2-VASc Score is 3.  Yearly risk of stroke: 3.2% (A, F, HTN) Patient is maintaining sinus rhythm, she is on very minimal dose of amiodarone at 100 mg every other day.  EKG is unchanged from previous.  No ischemic changes.  Continue present dose of amiodarone and also Eliquis, no bleeding diathesis, although she has gastric polyps.  - EKG 12-Lead - amiodarone (PACERONE) 100 MG tablet; Take 1 tablet (100 mg total) by mouth every other day.  Dispense: 45 tablet; Refill: 3  2. Primary hypertension Blood pressure is under excellent control, on her last office visit that started her on losartan 25 mg, this has been increased to 50 mg by her nephrologist, repeat BMP had revealed preserved EGFR and creatinine clearance.  Continue the same for now.  Keep herself very well-hydrated.  3. Stage 3b chronic kidney disease (HCC) Patient has stage IIIa-B chronic kidney disease, renal function has remained stable.  4. Gastric polyps Patient has history of gastric polyps and anemia related to this,  fortunately no recent bleeding.  However she has gained about 4 to 5 pounds in weight due to gastric irritation as she is now off of PPI and presently only on Pepcid which is not working.  I would like to try sucralfate.  - sucralfate (CARAFATE) 1 g tablet; Take 1 tablet (1 g total) by mouth 3 (three) times daily after meals.  Dispense: 90 tablet; Refill: 2  5. Gastroesophageal reflux disease without esophagitis As dictated above.  Overall she remains stable from cardiac standpoint, no clinical evidence of acute decompensated heart failure, I will see her back on annual basis.  I am very pleased with her progress.     Yates Decamp, MD, [facility] 01/17/2023, 11:03 AM Office: [phone] Fax: [phone] Pager: [phone]

## 2023-01-22 ENCOUNTER — Encounter: Payer: Self-pay | Admitting: Hematology

## 2023-01-23 ENCOUNTER — Telehealth: Payer: Self-pay | Admitting: Hematology

## 2023-02-14 ENCOUNTER — Other Ambulatory Visit: Payer: Self-pay | Admitting: Cardiology

## 2023-02-14 DIAGNOSIS — I480 Paroxysmal atrial fibrillation: Secondary | ICD-10-CM

## 2023-03-13 ENCOUNTER — Other Ambulatory Visit: Payer: Self-pay

## 2023-03-13 MED ORDER — ROSUVASTATIN CALCIUM 20 MG PO TABS: 20.0000 mg | ORAL_TABLET | Freq: Every day | ORAL | 1 refills | Status: DC

## 2023-03-13 MED ORDER — AMLODIPINE BESYLATE 5 MG PO TABS: 10.0000 mg | ORAL_TABLET | Freq: Every day | ORAL | 3 refills | Status: DC

## 2023-04-17 ENCOUNTER — Other Ambulatory Visit: Payer: Self-pay | Admitting: Cardiology

## 2023-04-17 DIAGNOSIS — I480 Paroxysmal atrial fibrillation: Secondary | ICD-10-CM

## 2023-05-20 DIAGNOSIS — M1711 Unilateral primary osteoarthritis, right knee: Principal | ICD-10-CM | POA: Insufficient documentation

## 2023-05-31 ENCOUNTER — Other Ambulatory Visit: Payer: Self-pay

## 2023-05-31 ENCOUNTER — Telehealth: Payer: Self-pay | Admitting: *Deleted

## 2023-05-31 ENCOUNTER — Encounter
Admission: RE | Admit: 2023-05-31 | Discharge: 2023-05-31 | Disposition: A | Discharge status: Home / Self Care | Payer: Medicare HMO | Source: Ambulatory Visit | Attending: Orthopedic Surgery | Admitting: Orthopedic Surgery

## 2023-05-31 VITALS — Ht 63.0 in | Wt 191.0 lb

## 2023-05-31 DIAGNOSIS — Z01818 Encounter for other preprocedural examination: Secondary | ICD-10-CM

## 2023-05-31 HISTORY — DX: Unspecified osteoarthritis, unspecified site: M19.90

## 2023-05-31 HISTORY — DX: Heart failure, unspecified: I50.9

## 2023-05-31 HISTORY — DX: Chronic kidney disease, stage 3 unspecified: N18.30

## 2023-05-31 NOTE — Patient Instructions (Signed)
Your procedure is scheduled on: Monday 06/11/23 To find out your arrival time, please call [phone] between 1PM - 3PM on:   Friday 06/08/23 Report to the Registration Desk on the 1st floor of the [facility]. FREE Valet parking is available.  If your arrival time is 6:00 am, do not arrive before that time as the [facility] entrance doors do not open until 6:00 am.  REMEMBER: Instructions that are not followed completely may result in serious medical risk, up to and including death; or upon the discretion of your surgeon and anesthesiologist your surgery may need to be rescheduled.  Do not eat food after midnight the night before surgery.  No gum chewing or hard candies.  You may however, drink CLEAR liquids up to 2 hours before you are scheduled to arrive for your surgery. Do not drink anything within 2 hours of your scheduled arrival time.  Clear liquids include: - water  - apple juice without pulp - gatorade (not RED colors) - black coffee or tea (Do NOT add milk or creamers to the coffee or tea) Do NOT drink anything that is not on this list.  Type 1 and Type 2 diabetics should only drink water.  In addition, your doctor has ordered for you to drink the provided:  Ensure Pre-Surgery Clear Carbohydrate Drink  Drinking this carbohydrate drink up to two hours before surgery helps to reduce insulin resistance and improve patient outcomes. Please complete drinking 2 hours before scheduled arrival time.  One week prior to surgery: Stop Anti-inflammatories (NSAIDS) such as Advil, Aleve, Ibuprofen, Motrin, Naproxen, Naprosyn and Aspirin based products such as Excedrin, Goody's Powder, BC Powder. You may however, continue to take Tylenol if needed for pain up until the day of surgery.  Stop ANY OVER THE COUNTER supplements or vitamins for 1 week prior to surgery.  Continue taking all prescribed medications with the exception of the following: Eliquis, last dose will be evening of   06/07/23  TAKE ONLY THESE MEDICATIONS THE MORNING OF SURGERY WITH A SIP OF WATER:  amLODipine (NORVASC) 5 MG tablet  famotidine (PEPCID) 20 MG tablet Antacid (take one the night before and one on the morning of surgery - helps to prevent nausea after surgery.) amiodarone (PACERONE) 100 MG tablet if scheduled for that day  No Alcohol for 24 hours before or after surgery.  No Smoking including e-cigarettes for 24 hours before surgery.  No chewable tobacco products for at least 6 hours before surgery.  No nicotine patches on the day of surgery.  Do not use any "recreational" drugs for at least a week (preferably 2 weeks) before your surgery.  Please be advised that the combination of cocaine and anesthesia may have negative outcomes, up to and including death. If you test positive for cocaine, your surgery will be cancelled.  On the morning of surgery brush your teeth with toothpaste and water, you may rinse your mouth with mouthwash if you wish. Do not swallow any toothpaste or mouthwash.  Use CHG Soap or wipes as directed on instruction sheet. Shower daily for 5 days starting on Thursday 06/07/23   Do not wear lotions, powders, or perfumes on the day of surgery.  Do not shave body hair from the neck down 48 hours before surgery.  Wear comfortable clothing (specific to your surgery type) to the hospital.  Do not wear jewelry, make-up, hairpins, clips or nail polish.  For welded (permanent) jewelry: bracelets, anklets, waist bands, etc.  Please have this  removed prior to surgery.  If it is not removed, there is a chance that hospital personnel will need to cut it off on the day of surgery. Contact lenses, hearing aids and dentures may not be worn into surgery.  Do not bring valuables to the hospital. [facility] is not responsible for any missing/lost belongings or valuables.   Notify your doctor if there is any change in your medical condition (cold, fever, infection).  If you  are being discharged the day of surgery, you will not be allowed to drive home. You will need a responsible individual to drive you home and stay with you for 24 hours after surgery.   If you are taking public transportation, you will need to have a responsible individual with you.  If you are being admitted to the hospital overnight, leave your suitcase in the car. After surgery it may be brought to your room.  In case of increased patient census, it may be necessary for you, the patient, to continue your postoperative care in the Same Day Surgery department.  After surgery, you can help prevent lung complications by doing breathing exercises.  Take deep breaths and cough every 1-2 hours. Your doctor may order a device called an Incentive Spirometer to help you take deep breaths. When coughing or sneezing, hold a pillow firmly against your incision with both hands. This is called "splinting." Doing this helps protect your incision. It also decreases belly discomfort.  Surgery Visitation Policy:  Patients undergoing a surgery or procedure may have two family members or support persons with them as long as the person is not COVID-19 positive or experiencing its symptoms.   Inpatient Visitation:    Visiting hours are 7 a.m. to 8 p.m. Up to four visitors are allowed at one time in a patient room. The visitors may rotate out with other people during the day. One designated support person (adult) may remain overnight.  Please call the Pre-admissions Testing Dept. at [phone] if you have any questions about these instructions.    Pre-operative 5 CHG Bath Instructions   You can play a key role in reducing the risk of infection after surgery. Your skin needs to be as free of germs as possible. You can reduce the number of germs on your skin by washing with CHG (chlorhexidine gluconate) soap before surgery. CHG is an antiseptic soap that kills germs and continues to kill germs even after  washing.   DO NOT use if you have an allergy to chlorhexidine/CHG or antibacterial soaps. If your skin becomes reddened or irritated, stop using the CHG and notify one of our RNs at [phone].   Please shower with the CHG soap starting 4 days before surgery using the following schedule:     Please keep in mind the following:  DO NOT shave, including legs and underarms, starting the day of your first shower.   You may shave your face at any point before/day of surgery.  Place clean sheets on your bed the day you start using CHG soap. Use a clean washcloth (not used since being washed) for each shower. DO NOT sleep with pets once you start using the CHG.   CHG Shower Instructions:  If you choose to wash your hair and private area, wash first with your normal shampoo/soap.  After you use shampoo/soap, rinse your hair and body thoroughly to remove shampoo/soap residue.  Turn the water OFF and apply about 3 tablespoons (45 ml) of CHG soap to a  CLEAN washcloth.  Apply CHG soap ONLY FROM YOUR NECK DOWN TO YOUR TOES (washing for 3-5 minutes)  DO NOT use CHG soap on face, private areas, open wounds, or sores.  Pay special attention to the area where your surgery is being performed.  If you are having back surgery, having someone wash your back for you may be helpful. Wait 2 minutes after CHG soap is applied, then you may rinse off the CHG soap.  Pat dry with a clean towel  Put on clean clothes/pajamas   If you choose to wear lotion, please use ONLY the CHG-compatible lotions on the back of this paper.     Additional instructions for the day of surgery: DO NOT APPLY any lotions, deodorants, cologne, or perfumes.   Put on clean/comfortable clothes.  Brush your teeth.  Ask your nurse before applying any prescription medications to the skin.      CHG Compatible Lotions   Aveeno Moisturizing lotion  Cetaphil Moisturizing Cream  Cetaphil Moisturizing Lotion  Clairol Herbal Essence  Moisturizing Lotion, Dry Skin  Clairol Herbal Essence Moisturizing Lotion, Extra Dry Skin  Clairol Herbal Essence Moisturizing Lotion, Normal Skin  Curel Age Defying Therapeutic Moisturizing Lotion with Alpha Hydroxy  Curel Extreme Care Body Lotion  Curel Soothing Hands Moisturizing Hand Lotion  Curel Therapeutic Moisturizing Cream, Fragrance-Free  Curel Therapeutic Moisturizing Lotion, Fragrance-Free  Curel Therapeutic Moisturizing Lotion, Original Formula  Eucerin Daily Replenishing Lotion  Eucerin Dry Skin Therapy Plus Alpha Hydroxy Crme  Eucerin Dry Skin Therapy Plus Alpha Hydroxy Lotion  Eucerin Original Crme  Eucerin Original Lotion  Eucerin Plus Crme Eucerin Plus Lotion  Eucerin TriLipid Replenishing Lotion  Keri Anti-Bacterial Hand Lotion  Keri Deep Conditioning Original Lotion Dry Skin Formula Softly Scented  Keri Deep Conditioning Original Lotion, Fragrance Free Sensitive Skin Formula  Keri Lotion Fast Absorbing Fragrance Free Sensitive Skin Formula  Keri Lotion Fast Absorbing Softly Scented Dry Skin Formula  Keri Original Lotion  Keri Skin Renewal Lotion Keri Silky Smooth Lotion  Keri Silky Smooth Sensitive Skin Lotion  Nivea Body Creamy Conditioning Oil  Nivea Body Extra Enriched Lotion  Nivea Body Original Lotion  Nivea Body Sheer Moisturizing Lotion Nivea Crme  Nivea Skin Firming Lotion  NutraDerm 30 Skin Lotion  NutraDerm Skin Lotion  NutraDerm Therapeutic Skin Cream  NutraDerm Therapeutic Skin Lotion  ProShield Protective Hand Cream  Provon moisturizing lotion  How to Use an Incentive Spirometer  An incentive spirometer is a tool that measures how well you are filling your lungs with each breath. Learning to take long, deep breaths using this tool can help you keep your lungs clear and active. This may help to reverse or lessen your chance of developing breathing (pulmonary) problems, especially infection. You may be asked to use a spirometer: After a  surgery. If you have a lung problem or a history of smoking. After a long period of time when you have been unable to move or be active. If the spirometer includes an indicator to show the highest number that you have reached, your health care provider or respiratory therapist will help you set a goal. Keep a log of your progress as told by your health care provider. What are the risks? Breathing too quickly may cause dizziness or cause you to pass out. Take your time so you do not get dizzy or light-headed. If you are in pain, you may need to take pain medicine before doing incentive spirometry. It is harder to take a deep breath  if you are having pain. How to use your incentive spirometer  Sit up on the edge of your bed or on a chair. Hold the incentive spirometer so that it is in an upright position. Before you use the spirometer, breathe out normally. Place the mouthpiece in your mouth. Make sure your lips are closed tightly around it. Breathe in slowly and as deeply as you can through your mouth, causing the piston or the ball to rise toward the top of the chamber. Hold your breath for 3-5 seconds, or for as long as possible. If the spirometer includes a coach indicator, use this to guide you in breathing. Slow down your breathing if the indicator goes above the marked areas. Remove the mouthpiece from your mouth and breathe out normally. The piston or ball will return to the bottom of the chamber. Rest for a few seconds, then repeat the steps 10 or more times. Take your time and take a few normal breaths between deep breaths so that you do not get dizzy or light-headed. Do this every 1-2 hours when you are awake. If the spirometer includes a goal marker to show the highest number you have reached (best effort), use this as a goal to work toward during each repetition. After each set of 10 deep breaths, cough a few times. This will help to make sure that your lungs are clear. If you have  an incision on your chest or abdomen from surgery, place a pillow or a rolled-up towel firmly against the incision when you cough. This can help to reduce pain while taking deep breaths and coughing. General tips When you are able to get out of bed: Walk around often. Continue to take deep breaths and cough in order to clear your lungs. Keep using the incentive spirometer until your health care provider says it is okay to stop using it. If you have been in the hospital, you may be told to keep using the spirometer at home. Contact a health care provider if: You are having difficulty using the spirometer. You have trouble using the spirometer as often as instructed. Your pain medicine is not giving enough relief for you to use the spirometer as told. You have a fever. Get help right away if: You develop shortness of breath. You develop a cough with bloody mucus from the lungs. You have fluid or blood coming from an incision site after you cough. Summary An incentive spirometer is a tool that can help you learn to take long, deep breaths to keep your lungs clear and active. You may be asked to use a spirometer after a surgery, if you have a lung problem or a history of smoking, or if you have been inactive for a long period of time. Use your incentive spirometer as instructed every 1-2 hours while you are awake. If you have an incision on your chest or abdomen, place a pillow or a rolled-up towel firmly against your incision when you cough. This will help to reduce pain. Get help right away if you have shortness of breath, you cough up bloody mucus, or blood comes from your incision when you cough. This information is not intended to replace advice given to you by your health care provider. Make sure you discuss any questions you have with your health care provider. Document Revised: 11/03/2019 Document Reviewed: 11/03/2019 Elsevier Patient Education  2023 Elsevier Inc.    Preoperative  Educational Videos for Total Hip, Knee and Shoulder Replacements  To better  prepare for surgery, please view our videos that explain the physical activity and discharge planning required to have the best surgical recovery at [facility].  [email or web address]  Questions? Call [phone] or email [email or web address]

## 2023-05-31 NOTE — Discharge Instructions (Signed)

## 2023-05-31 NOTE — Telephone Encounter (Signed)
[facility] Pre-operative Risk Assessment    Patient Name: Amanda Frazier  DOB: May 08, 1946 MRN: [id number]  HEARTCARE STAFF:  - IMPORTANT!!!!!! Under Visit Info/Reason for Call, type in Other and utilize the format Clearance MM/DD/YY or Clearance TBD. Do not use dashes or single digits. - Please review there is not already an duplicate clearance open for this procedure. - If request is for dental extraction, please clarify the # of teeth to be extracted. - If the patient is currently at the dentist's office, call Pre-Op Callback Staff (MA/nurse) to input urgent request.  - If the patient is not currently in the dentist office, please route to the Pre-Op pool.  Request for surgical clearance:  What type of surgery is being performed? Right computer assisted total knee arthroplasty   When is this surgery scheduled? 06/11/23  What type of clearance is required (medical clearance vs. Pharmacy clearance to hold med vs. Both)? Both  Are there any medications that need to be held prior to surgery and how long? Eliquis  Practice name and name of physician performing surgery? [facility], Dr Amanda Frazier  What is the office phone number? [phone]   7.   What is the office fax number? [phone]  8.   Anesthesia type (None, local, MAC, general) ? Not indicated   Amanda Frazier 05/31/2023, 5:04 PM  _________________________________________________________________   (provider comments below)

## 2023-06-01 ENCOUNTER — Telehealth: Payer: Self-pay | Admitting: *Deleted

## 2023-06-01 NOTE — Telephone Encounter (Signed)
I tried to call the pt. Someone answered but then hung up. I will try again.   I tried to call the pt. Someone answered but then hung up. I will try again.    I was able to reach the pt and she has been added on ok per Joni Reining, DNP due to procedure date and med hold.    Med rec and consent are done.

## 2023-06-01 NOTE — Telephone Encounter (Signed)
Pharmacy please advise on holding Eliquis prior to right computer assisted total knee arthoplasty scheduled for 06/11/2023. Thank you.

## 2023-06-01 NOTE — Telephone Encounter (Signed)
Patient with diagnosis of afib on Eliquis for anticoagulation.    Procedure: Right computer assisted total knee arthroplasty  Date of procedure: 06/11/23   CHA2DS2-VASc Score = 4   This indicates a 4.8% annual risk of stroke. The patient's score is based upon: CHF History: 0 HTN History: 1 Diabetes History: 0 Stroke History: 0 Vascular Disease History: 0 Age Score: 2 Gender Score: 1      CrCl 35 ml/min Platelet count 200  Per office protocol, patient can hold Eliquis for 3 days prior to procedure.    **This guidance is not considered finalized until pre-operative APP has relayed final recommendations.**

## 2023-06-01 NOTE — Telephone Encounter (Signed)
I tried to call the pt. Someone answered but then hung up. I will try again.   I was able to reach the pt and she has been added on ok per Joni Reining, DNP due to procedure date and med hold.   Med rec and consent are done.     Patient Consent for Virtual Visit        Amanda Frazier has provided verbal consent on 06/01/2023 for a virtual visit (video or telephone).   CONSENT FOR VIRTUAL VISIT FOR:  Amanda Frazier  By participating in this virtual visit I agree to the following:  I hereby voluntarily request, consent and authorize [facility] and its employed or contracted physicians, physician assistants, nurse practitioners or other licensed health care professionals (the Practitioner), to provide me with telemedicine health care services (the "Services") as deemed necessary by the treating Practitioner. I acknowledge and consent to receive the Services by the Practitioner via telemedicine. I understand that the telemedicine visit will involve communicating with the Practitioner through live audiovisual communication technology and the disclosure of certain medical information by electronic transmission. I acknowledge that I have been given the opportunity to request an in-person assessment or other available alternative prior to the telemedicine visit and am voluntarily participating in the telemedicine visit.  I understand that I have the right to withhold or withdraw my consent to the use of telemedicine in the course of my care at any time, without affecting my right to future care or treatment, and that the Practitioner or I may terminate the telemedicine visit at any time. I understand that I have the right to inspect all information obtained and/or recorded in the course of the telemedicine visit and may receive copies of available information for a reasonable fee.  I understand that some of the potential risks of receiving the Services via telemedicine include:   Delay or interruption in medical evaluation due to technological equipment failure or disruption; Information transmitted may not be sufficient (e.g. poor resolution of images) to allow for appropriate medical decision making by the Practitioner; and/or  In rare instances, security protocols could fail, causing a breach of personal health information.  Furthermore, I acknowledge that it is my responsibility to provide information about my medical history, conditions and care that is complete and accurate to the best of my ability. I acknowledge that Practitioner's advice, recommendations, and/or decision may be based on factors not within their control, such as incomplete or inaccurate data provided by me or distortions of diagnostic images or specimens that may result from electronic transmissions. I understand that the practice of medicine is not an exact science and that Practitioner makes no warranties or guarantees regarding treatment outcomes. I acknowledge that a copy of this consent can be made available to me via my patient portal [facility] MyChart), or I can request a printed copy by calling the office of [facility].    I understand that my insurance will be billed for this visit.   I have read or had this consent read to me. I understand the contents of this consent, which adequately explains the benefits and risks of the Services being provided via telemedicine.  I have been provided ample opportunity to ask questions regarding this consent and the Services and have had my questions answered to my satisfaction. I give my informed consent for the services to be provided through the use of telemedicine in my medical care

## 2023-06-01 NOTE — Telephone Encounter (Signed)
Name: Amanda Frazier  DOB: April 29, 1946  MRN: [id number]  Primary Cardiologist: None   Preoperative team, please contact this patient and set up a phone call appointment for further preoperative risk assessment. Please obtain consent and complete medication review. Thank you for your help.  I confirm that guidance regarding antiplatelet and oral anticoagulation therapy has been completed and, if necessary, noted below.  Per office protocol, patient can hold Eliquis for 3 days prior to procedure.    I also confirmed the patient resides in the state of [place]. As per [facility] Medical Board telemedicine laws, the patient must reside in the state in which the provider is licensed.   Joni Reining, NP 06/01/2023, 10:08 AM  [facility]

## 2023-06-05 ENCOUNTER — Encounter
Admission: RE | Admit: 2023-06-05 | Discharge: 2023-06-05 | Disposition: A | Discharge status: Home / Self Care | Payer: Medicare HMO | Source: Ambulatory Visit | Attending: Orthopedic Surgery | Admitting: Orthopedic Surgery

## 2023-06-05 DIAGNOSIS — Z01812 Encounter for preprocedural laboratory examination: Secondary | ICD-10-CM | POA: Diagnosis not present

## 2023-06-05 DIAGNOSIS — Z01810 Encounter for preprocedural cardiovascular examination: Secondary | ICD-10-CM | POA: Insufficient documentation

## 2023-06-05 DIAGNOSIS — M1711 Unilateral primary osteoarthritis, right knee: Secondary | ICD-10-CM | POA: Insufficient documentation

## 2023-06-05 DIAGNOSIS — Z01818 Encounter for other preprocedural examination: Secondary | ICD-10-CM

## 2023-06-05 LAB — COMPREHENSIVE METABOLIC PANEL
ALT: 16 U/L (ref 0–44)
AST: 25 U/L (ref 15–41)
Albumin: 4.3 g/dL (ref 3.5–5.0)
Alkaline Phosphatase: 68 U/L (ref 38–126)
Anion gap: 7 (ref 5–15)
BUN: 22 mg/dL (ref 8–23)
CO2: 24 mmol/L (ref 22–32)
Calcium: 9.4 mg/dL (ref 8.9–10.3)
Chloride: 108 mmol/L (ref 98–111)
Creatinine, Ser: 1.42 mg/dL — ABNORMAL HIGH (ref 0.44–1.00)
GFR, Estimated: 38 mL/min — ABNORMAL LOW (ref >60)
Glucose, Bld: 104 mg/dL — ABNORMAL HIGH (ref 70–99)
Potassium: 4.6 mmol/L (ref 3.5–5.1)
Sodium: 139 mmol/L (ref 135–145)
Total Bilirubin: 1.2 mg/dL (ref 0.3–1.2)
Total Protein: 7.9 g/dL (ref 6.5–8.1)

## 2023-06-05 LAB — URINALYSIS, ROUTINE W REFLEX MICROSCOPIC
Bilirubin Urine: NEGATIVE
Glucose, UA: NEGATIVE mg/dL
Hgb urine dipstick: NEGATIVE
Ketones, ur: NEGATIVE mg/dL
Leukocytes,Ua: NEGATIVE
Nitrite: NEGATIVE
Protein, ur: NEGATIVE mg/dL
Specific Gravity, Urine: 1.01 (ref 1.005–1.030)
pH: 5 (ref 5.0–8.0)

## 2023-06-05 LAB — CBC
HCT: 35.3 % — ABNORMAL LOW (ref 36.0–46.0)
Hemoglobin: 11.5 g/dL — ABNORMAL LOW (ref 12.0–15.0)
MCH: 29.2 pg (ref 26.0–34.0)
MCHC: 32.6 g/dL (ref 30.0–36.0)
MCV: 89.6 fL (ref 80.0–100.0)
Platelets: 217 10*3/uL (ref 150–400)
RBC: 3.94 MIL/uL (ref 3.87–5.11)
RDW: 13.5 % (ref 11.5–15.5)
WBC: 5.5 10*3/uL (ref 4.0–10.5)
nRBC: 0 % (ref 0.0–0.2)

## 2023-06-05 LAB — TYPE AND SCREEN
ABO/RH(D): A POS
Antibody Screen: NEGATIVE

## 2023-06-05 LAB — SURGICAL PCR SCREEN
MRSA, PCR: NEGATIVE
Staphylococcus aureus: NEGATIVE

## 2023-06-05 LAB — C-REACTIVE PROTEIN: CRP: 0.5 mg/dL (ref <1.0)

## 2023-06-05 LAB — SEDIMENTATION RATE: Sed Rate: 35 mm/h — ABNORMAL HIGH (ref 0–30)

## 2023-06-06 ENCOUNTER — Encounter: Payer: Self-pay | Admitting: Orthopedic Surgery

## 2023-06-06 NOTE — Progress Notes (Incomplete)
Perioperative / Anesthesia Services  Pre-Admission Testing Clinical Review / Pre-Operative Anesthesia Consult  Date: 06/07/23  Patient Demographics:  Name: Amanda Frazier DOB:   Jul 04, 1946 MRN:   [id number]  Planned Surgical Procedure(s):    Case: [id number] Date/Time: 06/11/23 0700   Procedure: COMPUTER ASSISTED TOTAL KNEE ARTHROPLASTY (Right: Knee)   Anesthesia type: Choice   Pre-op diagnosis: PRIMARY OSTREOARTHRITIS OF RIGHT KNEE.   Location: [facility] OR ROOM 01 / [facility]   Surgeons: Amanda Heinz, MD     NOTE: Available PAT nursing documentation and vital signs have been reviewed. Clinical nursing staff has updated patient's PMH/PSHx, current medication list, and drug allergies/intolerances to ensure comprehensive history available to assist in medical decision making as it pertains to the aforementioned surgical procedure and anticipated anesthetic course. Extensive review of available clinical information personally performed. Amanda Frazier PMH and PSHx updated with any diagnoses/procedures that  may have been inadvertently omitted during her intake with the pre-admission testing department's nursing staff.  Clinical Discussion:  Amanda Frazier is a 77 y.o. female who is submitted for pre-surgical anesthesia review and clearance prior to her undergoing the above procedure. Patient has never been a smoker. Pertinent PMH includes: CAD, CHF, atrial fibrillation, aortic atherosclerosis, systolic murmur, HTN, HLD, CKD-III, pulmonary hypertension, GERD (uses CaCO3 PRN), IDA, OA.  Patient is followed by cardiology Amanda Halim, MD). She was last seen in the cardiology clinic on 01/17/2023 understands; notes reviewed. At the time of her clinic visit, patient doing well overall from a cardiovascular perspective. Patient denied any chest pain, shortness of breath, PND, orthopnea, palpitations, significant peripheral edema, weakness, fatigue, vertiginous symptoms, or  presyncope/syncope. Patient with a past medical history significant for cardiovascular diagnoses. Documented physical exam was grossly benign, providing no evidence of acute exacerbation and/or decompensation of the patient's known cardiovascular conditions.  Myocardial perfusion imaging study was performed on 10/27/2019 revealing a normal left ventricular systolic function with an EF of 55%.  Inferior and inferolateral breast tissue attenuation was noted.  There was no evidence of stress-induced myocardial ischemia or arrhythmia; no scintigraphic evidence of scar.  Study determined to be normal and low risk.  Most recent TTE was performed on 07/29/2020 revealing a low normal left ventricular systolic function with an EF of 50-55%.  There were no regional wall motion abnormalities.Left ventricular diastolic Doppler parameters consistent with abnormal relaxation (G1DD).  Left atrial pressure was elevated.  Right ventricular size and function was normal.  There was moderate mitral valve regurgitation, in addition to mild to moderate tricuspid valve regurgitation.  Estimated PASP was 44 mmHg.  When compared to previous study performed and 10/2019, pulmonary hypertension noted to be new.  All transvalvular gradients were noted to be normal providing no evidence of valvular stenosis.  Aorta normal in size with no evidence of aneurysmal dilatation.  Patient with an atrial fibrillation diagnosis; CHA2DS2-VASc Score = 6 (age x 2, sex, CHF, HTN, vascular disease history).patient underwent DCCV procedure at [facility] on 05/12/2020, at which time she received synchronized cardioversions ( 200 J x 1 and 360 J x 1) restoring NSR.  Repeat DCCV procedure at [facility] performed on 05/18/2020, with patient receiving synchronized cardioversion delivery 150 J x 1 followed by a 200 J x ),  which restored NSR.  Current cardiac rate and rhythm are currently being maintained on oral amiodarone. She is chronically  anticoagulated using standard dose apixaban; reported to be compliant with therapy with no evidence or reports of GI/GU bleeding.  Blood pressure reasonably controlled at 131/73 mmHg on currently prescribed CCB (amlodipine), diuretic (furosemide), and ARB (losartan) therapies. She is on rosuvastatin for her HLD diagnosis and further ASCVD prevention. Patient is not diabetic. She does not have an OSAH diagnosis. Patient is able to complete all of her  ADL/IADLs without cardiovascular limitation.  Per the DASI, patient is able to achieve at least 4 METS of physical activity without experiencing any significant degree of angina/anginal equivalent symptoms. No changes were made to her medication regimen during her visit with cardiology.  Patient scheduled to follow-up with outpatient cardiology in 1 year or sooner if needed.  Amanda Frazier is scheduled for an elective COMPUTER ASSISTED TOTAL KNEE ARTHROPLASTY (Right: Knee) on 06/11/2023 with Dr. Francesco Sor, MD.  Given patient's past medical history significant for cardiovascular diagnoses, presurgical cardiac clearance was sought by the PAT team.  Per cardiology, "according to the Revised Cardiac Risk Index (RCRI), her Perioperative Risk of Major Cardiac Event is (%): 0.9. Her Functional Capacity in METs is: 5.59 according to the Duke Activity Status Index (DASI). Therefore, based on ACC/AHA guidelines, patient would be at ACCEPTABLE risk for the planned procedure without further cardiovascular testing".  Again, this patient is on daily oral anticoagulation therapy using a DOAC medication. She has been instructed on recommendations for holding her apixaban for 3 days prior to her procedure with plans to restart as soon as postoperative bleeding risk felt to be minimized by her attending surgeon. The patient has been instructed that her last dose of her apixaban should be on 06/07/2023.  Patient denies previous perioperative complications with anesthesia in  the past. In review of the available records, it is noted that patient underwent a MAC course at [facility] in 04/2022 without documented complications.      05/31/2023    1:50 PM 01/17/2023   10:42 AM 01/16/2023   11:58 AM  Vitals with BMI  Height 5\' 3"  5\' 3"  5\' 3"   Weight 191 lbs 183 lbs 3 oz 182 lbs 13 oz  BMI 33.84 32.46 32.39  Systolic  130 146  Diastolic  73 50  Pulse  69 57    Providers/Specialists:   NOTE: Primary physician provider listed below. Patient may have been seen by APP or partner within same practice.   PROVIDER ROLE / SPECIALTY LAST OV  Hooten, Illene Labrador, MD Orthopedics (Surgeon) 05/17/2023  Mila Palmer, MD Primary Care Provider 09/04/2022  Yates Decamp, MD Cardiology 01/17/2023; update preop APP call on 06/07/2023  Wyvonnia Lora, MD Hematology/Oncology 01/16/2023   Allergies:  Celecoxib, Fosamax [alendronate sodium], Lipitor [atorvastatin calcium], Valsartan-hydrochlorothiazide, Benazepril, Nickel, Palladium chloride, Simvastatin, and Valsartan  Current Home Medications:   No current facility-administered medications for this encounter.    amiodarone (PACERONE) 100 MG tablet   amLODipine (NORVASC) 5 MG tablet   B Complex Vitamins (B-COMPLEX/B-12) LIQD   calcium carbonate (TUMS - DOSED IN MG ELEMENTAL CALCIUM) 500 MG chewable tablet   Cholecalciferol (VITAMIN D3) 125 MCG (5000 UT) CAPS   ELIQUIS 5 MG TABS tablet   famotidine (PEPCID) 20 MG tablet   furosemide (LASIX) 20 MG tablet   losartan (COZAAR) 50 MG tablet   rosuvastatin (CRESTOR) 20 MG tablet   History:   Past Medical History:  Diagnosis Date   Aortic atherosclerosis (HCC)    Arthritis    Atrial fibrillation (HCC)    a.) CHA2DS2-VASc = 6 (age x2, sex, CHF, HTN, vascular disease history) as of 06/07/2023; b.) s/p  DCCV at [facility] 05/12/2020 (200 J x 1, 360 J x1) --> NSR; c.) s/p DCCV at [facility] 05/18/2020 (150 J x 1, 200 J x1) --> NSR; d.) cardiac rate/rhythm  maintained on oral amiodarone; chronically anticoagulated using apixaban   CAD (coronary artery disease)    CHF (congestive heart failure) (HCC)    a.) TTE 10/27/2019: EF 55%, inf/inferolat breast attenuation; b.) TTE 11/05/2019: EF 50-55%, no RWMAs, G1DD, mild MR/PR; c.) TTE 07/29/2020: EF 50-55%, mild LVH, G1DD, mod LAE, mod MR, mild-mod TR, PASP 44   CKD (chronic kidney disease) stage 3, GFR 30-59 ml/min (HCC)    Gastric polyps    GERD (gastroesophageal reflux disease)    History of 2019 novel coronavirus disease (COVID-19) 03/30/2020   History of bilateral cataract extraction    Hyperlipidemia    Hypertension    IDA (iron deficiency anemia)    Long term current use of amiodarone    On apixaban therapy    Pulmonary hypertension (HCC) 06/07/2020   a.) CT chest 06/07/2020: dilated main PA suggesting pHTN; b.)TTE 07/29/2020: PASP 44 mmHg   SNHL (sensorineural hearing loss)    Systolic murmur    Past Surgical History:  Procedure Laterality Date   BIOPSY  07/12/2021   Procedure: BIOPSY;  Surgeon: Charlott Rakes, MD;  Location: [facility];  Service: Endoscopy;;   CARDIOVERSION N/A 05/12/2020   Procedure: CARDIOVERSION; Location: [facility]; Surgeon: Vilinda Boehringer, MD   CARDIOVERSION N/A 05/18/2020   Procedure: CARDIOVERSION; Location: [facility]; Surgeon: Wille Glaser, MD   CATARACT EXTRACTION, BILATERAL     COLONOSCOPY WITH PROPOFOL N/A 07/12/2021   Procedure: COLONOSCOPY WITH PROPOFOL;  Surgeon: Charlott Rakes, MD;  Location: [facility];  Service: Endoscopy;  Laterality: N/A;   ESOPHAGOGASTRODUODENOSCOPY (EGD) WITH PROPOFOL N/A 07/12/2021   Procedure: ESOPHAGOGASTRODUODENOSCOPY (EGD) WITH PROPOFOL;  Surgeon: Charlott Rakes, MD;  Location: [facility];  Service: Endoscopy;  Laterality: N/A;   POLYPECTOMY  07/12/2021   Procedure: POLYPECTOMY;  Surgeon: Charlott Rakes, MD;  Location: [facility];  Service: Endoscopy;;   REPLACEMENT TOTAL KNEE Left 2015    SCLEROTHERAPY  07/12/2021   Procedure: SCLEROTHERAPY;  Surgeon: Charlott Rakes, MD;  Location: [facility];  Service: Endoscopy;;   SUBMUCOSAL TATTOO INJECTION  07/12/2021   Procedure: SUBMUCOSAL TATTOO INJECTION;  Surgeon: Charlott Rakes, MD;  Location: [facility];  Service: Endoscopy;;   TUBAL LIGATION     Family History  Problem Relation Age of Onset   Breast cancer Maternal Aunt    Atrial fibrillation Mother    Hyperlipidemia Mother    Lung cancer Mother    Atrial fibrillation Father    Colon cancer Father    Breast cancer Sister    Leukemia Brother    Social History   Tobacco Use   Smoking status: Never   Smokeless tobacco: Never  Vaping Use   Vaping status: Never Used  Substance Use Topics   Alcohol use: Never   Drug use: Never    Pertinent Clinical Results:  LABS:   Hospital Outpatient Visit on 06/05/2023  Component Date Value Ref Range Status   ABO/RH(D) 06/05/2023 A POS   Final   Antibody Screen 06/05/2023 NEG   Final   Sample Expiration 06/05/2023 06/19/2023,2359   Final   Extend sample reason 06/05/2023    Final                   Value:NO TRANSFUSIONS OR PREGNANCY IN THE PAST 3 MONTHS Performed at [facility], [address] [place],  [place] [address]    WBC 06/05/2023 5.5  4.0 - 10.5 K/uL Final   RBC 06/05/2023 3.94  3.87 - 5.11 MIL/uL Final   Hemoglobin 06/05/2023 11.5 (L)  12.0 - 15.0 g/dL Final   HCT 44/08/270 35.3 (L)  36.0 - 46.0 % Final   MCV 06/05/2023 89.6  80.0 - 100.0 fL Final   MCH 06/05/2023 29.2  26.0 - 34.0 pg Final   MCHC 06/05/2023 32.6  30.0 - 36.0 g/dL Final   RDW 53/66/4403 13.5  11.5 - 15.5 % Final   Platelets 06/05/2023 217  150 - 400 K/uL Final   nRBC 06/05/2023 0.0  0.0 - 0.2 % Final   Performed at [facility], [address] [place], [place] [address]   Sodium 06/05/2023 139  135 - 145 mmol/L Final   Potassium 06/05/2023 4.6  3.5 - 5.1 mmol/L Final   Chloride 06/05/2023 108  98 - 111 mmol/L  Final   CO2 06/05/2023 24  22 - 32 mmol/L Final   Glucose, Bld 06/05/2023 104 (H)  70 - 99 mg/dL Final   Glucose reference range applies only to samples taken after fasting for at least 8 hours.   BUN 06/05/2023 22  8 - 23 mg/dL Final   Creatinine, Ser 06/05/2023 1.42 (H)  0.44 - 1.00 mg/dL Final   Calcium 95/63/8756 9.4  8.9 - 10.3 mg/dL Final   Total Protein 43/32/9518 7.9  6.5 - 8.1 g/dL Final   Albumin 84/16/6063 4.3  3.5 - 5.0 g/dL Final   AST 01/60/1093 25  15 - 41 U/L Final   ALT 06/05/2023 16  0 - 44 U/L Final   Alkaline Phosphatase 06/05/2023 68  38 - 126 U/L Final   Total Bilirubin 06/05/2023 1.2  0.3 - 1.2 mg/dL Final   GFR, Estimated 06/05/2023 38 (L)  >60 mL/min Final   Comment: (NOTE) Calculated using the CKD-EPI Creatinine Equation (2021)    Anion gap 06/05/2023 7  5 - 15 Final   Performed at [facility], [address] [place], [place] [address]   Color, Urine 06/05/2023 YELLOW (A)  YELLOW Final   APPearance 06/05/2023 CLEAR (A)  CLEAR Final   Specific Gravity, Urine 06/05/2023 1.010  1.005 - 1.030 Final   pH 06/05/2023 5.0  5.0 - 8.0 Final   Glucose, UA 06/05/2023 NEGATIVE  NEGATIVE mg/dL Final   Hgb urine dipstick 06/05/2023 NEGATIVE  NEGATIVE Final   Bilirubin Urine 06/05/2023 NEGATIVE  NEGATIVE Final   Ketones, ur 06/05/2023 NEGATIVE  NEGATIVE mg/dL Final   Protein, ur 32/20/2542 NEGATIVE  NEGATIVE mg/dL Final   Nitrite 70/62/3762 NEGATIVE  NEGATIVE Final   Leukocytes,Ua 06/05/2023 NEGATIVE  NEGATIVE Final   Performed at [facility], [address] [place], [place] [address]   CRP 06/05/2023 0.5  <1.0 mg/dL Final   Performed at [facility], [address] [place], [place] [address]   Sed Rate 06/05/2023 35 (H)  0 - 30 mm/hr Final   Performed at [facility], [address] [place], [place] [address]   MRSA, PCR 06/05/2023 NEGATIVE  NEGATIVE Final   Staphylococcus aureus 06/05/2023 NEGATIVE  NEGATIVE Final   Comment:  (NOTE) The Xpert SA Assay (FDA approved for NASAL specimens in patients 51 years of age and older), is one component of a comprehensive surveillance program. It is not intended to diagnose infection nor to guide or monitor treatment. Performed at [facility], [address] [place], [place] [address]  ECG: Date: 06/05/2023 Time ECG obtained: 0832 AM Rate: 57 bpm Rhythm: sinus bradycardia Axis (leads I and aVF): Normal Intervals: PR 178 ms. QRS 98 ms. QTc 449 ms. ST segment and T wave changes: No evidence of acute ST segment elevation or depression.   Comparison: Similar to previous tracing obtained on 01/17/2023   IMAGING / PROCEDURES: DIAGNOSTIC RADIOGRAPHS OF RIGHT KNEE performed on 05/17/2023 Significant narrowing of the medial cartilage space with near bone-on-bone articulation and associated varus alignment.   Osteophyte formation is noted.  Subchondral sclerosis is noted.   No evidence of fracture or dislocation.   DIAGNOSTIC RADIOGRAPHS OF LEFT KNEE performed on 05/17/2023 Good position of the total knee implants.  Good alignment is noted on the AP view.  Good cement mantle is appreciated without evidence of loosening.  No evidence of polyethylene wear or osteolysis.  No evidence of fracture or dislocation.   TRANSTHORACIC ECHOCARDIOGRAM performed on 07/29/2020 Left ventricle cavity is normal in size.  Mild concentric hypertrophy of the left ventricle.  Normal global wall motion.  Normal LV systolic function with visual EF 50-55%.  Doppler evidence of grade I (impaired) diastolic dysfunction, elevated LAP. Left atrial cavity is at least moderately dilated. Moderate (Grade III) mitral regurgitation. Mild to moderate tricuspid regurgitation.  Estimated pulmonary artery systolic pressure 44 mmHg.Compared to previous study on 11/05/2019, pulmonary hypertension is new.   CT CHEST WO CONTRAST performed on 06/07/2020 No evidence of a mediastinal  mass. Moderate left pleural effusion with associated moderate passive left lung base atelectasis. Small right pleural effusion. Small pericardial effusion/thickening. Three-vessel coronary atherosclerosis. Dilated main pulmonary artery, suggesting pulmonary arterial hypertension. Aortic atherosclerosis  MYOCARDIAL PERFUSION IMAGING STUDY (LEXISCAN) performed on 10/27/2019 Normal left systolic function with EF of 55% Inferior and inferolateral breast tissue attenuation No evidence of stress-induced myocardial ischemia or arrhythmia; no scintigraphic evidence of scar Normal low risk study  Impression and Plan:  ZYNIQUE SWEARENGEN has been referred for pre-anesthesia review and clearance prior to her undergoing the planned anesthetic and procedural courses. Available labs, pertinent testing, and imaging results were personally reviewed by me in preparation for upcoming operative/procedural course. [facility] medical record has been updated following extensive record review and patient interview with PAT staff.   This patient has been appropriately cleared by cardiology with an overall ACCEPTABLE risk of experiencing significant perioperative cardiovascular complications. Based on clinical review performed today (06/07/23), barring any significant acute changes in the patient's overall condition, it is anticipated that she will be able to proceed with the planned surgical intervention. Any acute changes in clinical condition may necessitate her procedure being postponed and/or cancelled. Patient will meet with anesthesia team (MD and/or CRNA) on the day of her procedure for preoperative evaluation/assessment. Questions regarding anesthetic course will be fielded at that time.   Pre-surgical instructions were reviewed with the patient during her PAT appointment, and questions were fielded to satisfaction by PAT clinical staff. She has been instructed on which medications that she will need to hold  prior to surgery, as well as the ones that have been deemed safe/appropriate to take on the day of her procedure. As part of the general education provided by PAT, patient made aware both verbally and in writing, that she would need to abstain from the use of any illegal substances during her perioperative course.  She was advised that failure to follow the provided instructions could necessitate case cancellation or result in serious perioperative complications up to and including death. Patient encouraged to  contact PAT and/or her surgeon's office to discuss any questions or concerns that may arise prior to surgery; verbalized understanding.   Quentin Mulling, MSN, APRN, FNP-C, CEN [facility]  Perioperative Services Nurse Practitioner Phone: [phone] Fax: [phone] 06/07/23 3:09 PM  NOTE: This note has been prepared using Dragon dictation software. Despite my best ability to proofread, there is always the potential that unintentional transcriptional errors may still occur from this process.

## 2023-06-07 ENCOUNTER — Encounter: Payer: Self-pay | Admitting: Orthopedic Surgery

## 2023-06-07 ENCOUNTER — Ambulatory Visit: Payer: Medicare HMO | Attending: Internal Medicine | Admitting: Nurse Practitioner

## 2023-06-07 DIAGNOSIS — Z01810 Encounter for preprocedural cardiovascular examination: Secondary | ICD-10-CM | POA: Diagnosis not present

## 2023-06-07 NOTE — Progress Notes (Signed)
Virtual Visit via Telephone Note   Because of Amanda Frazier's co-morbid illnesses, she is at least at moderate risk for complications without adequate follow up.  This format is felt to be most appropriate for this patient at this time.  The patient did not have access to video technology/had technical difficulties with video requiring transitioning to audio format only (telephone).  All issues noted in this document were discussed and addressed.  No physical exam could be performed with this format.  Please refer to the patient's chart for her consent to telehealth for [facility].  Evaluation Performed:  Preoperative cardiovascular risk assessment _____________   Date:  06/07/2023   Patient ID:  Amanda Frazier, DOB Jul 19, 1946, MRN [id number] Patient Location:  Home Provider location:   Office  Primary Care Provider:  Mila Palmer, MD Primary Cardiologist:  Yates Decamp, MD  Chief Complaint / Patient Profile   77 y.o. y/o female with a h/o paroxysmal atrial fibrillation, chronic diastolic heart failure, hypertension, hyperlipidemia, CKD stage IIIb, and GERD who is pending right computer-assisted total knee arthroplasty on 06/11/2023 with Dr. Ernest Pine of [facility] and presents today for telephonic preoperative cardiovascular risk assessment.  History of Present Illness    Amanda Frazier is a 77 y.o. female who presents via audio/video conferencing for a telehealth visit today.  Pt was last seen in cardiology clinic on 01/17/2023 by Dr. Jacinto Halim.  At that time Amanda Frazier was doing well.  The patient is now pending procedure as outlined above. Since her last visit, she has done well from a cardiac standpoint.  Her activity is somewhat continued in the setting of knee pain.  Prior to her knee problems she was exercising regularly and tolerating this well.  She denies chest pain, palpitations, dyspnea, pnd, orthopnea, n, v, dizziness, syncope, edema, weight  gain, or early satiety. All other systems reviewed and are otherwise negative except as noted above.   Past Medical History    Past Medical History:  Diagnosis Date   Aortic atherosclerosis (HCC)    Arthritis    Atrial fibrillation (HCC)    a.) CHA2DS2-VASc = 6 (age x2, sex, CHF, HTN, vascular disease history); b.) s/p DCCV at [facility] 05/12/2020 (200 J x 1, 360 J x1) --> NSR; c.) s/p DCCV at [facility] 05/18/2020 (150 J x 1, 200 J x1) --> NSR; d.) cardiac rate/rhythm maintained on oral amiodarone; chronically anticoagulated using apixaban   CAD (coronary artery disease)    CHF (congestive heart failure) (HCC)    a.) TTE 10/27/2019: EF 55%, inf/inferolat breast attenuation; b.) TTE 11/05/2019: EF 50-55%, no RWMAs, G1DD, mild MR/PR; c.) TTE 07/29/2020: EF 50-55%, mild LVH, G1DD, mod LAE, mod MR, mild-mod TR, PASP 44   CKD (chronic kidney disease) stage 3, GFR 30-59 ml/min (HCC)    Gastric polyps    GERD (gastroesophageal reflux disease)    History of 2019 novel coronavirus disease (COVID-19) 03/30/2020   History of bilateral cataract extraction    Hyperlipidemia    Hypertension    IDA (iron deficiency anemia)    Long term current use of amiodarone    On apixaban therapy    Pulmonary hypertension (HCC) 06/07/2020   a.) CT chest 06/07/2020: dilated main PA suggesting pHTN; b.)TTE 07/29/2020: PASP 44 mmHg   SNHL (sensorineural hearing loss)    Systolic murmur    Past Surgical History:  Procedure Laterality Date   BIOPSY  07/12/2021   Procedure: BIOPSY;  Surgeon: Charlott Rakes, MD;  Location: [facility];  Service: Endoscopy;;   CARDIOVERSION N/A 05/12/2020   Procedure: CARDIOVERSION; Location: [facility]; Surgeon: Vilinda Boehringer, MD   CARDIOVERSION N/A 05/18/2020   Procedure: CARDIOVERSION; Location: [facility]; Surgeon: Wille Glaser, MD   CATARACT EXTRACTION, BILATERAL     COLONOSCOPY WITH PROPOFOL N/A 07/12/2021   Procedure: COLONOSCOPY WITH PROPOFOL;  Surgeon: Charlott Rakes, MD;  Location: [facility];  Service: Endoscopy;  Laterality: N/A;   ESOPHAGOGASTRODUODENOSCOPY (EGD) WITH PROPOFOL N/A 07/12/2021   Procedure: ESOPHAGOGASTRODUODENOSCOPY (EGD) WITH PROPOFOL;  Surgeon: Charlott Rakes, MD;  Location: [facility];  Service: Endoscopy;  Laterality: N/A;   POLYPECTOMY  07/12/2021   Procedure: POLYPECTOMY;  Surgeon: Charlott Rakes, MD;  Location: [facility];  Service: Endoscopy;;   REPLACEMENT TOTAL KNEE Left 2015   SCLEROTHERAPY  07/12/2021   Procedure: SCLEROTHERAPY;  Surgeon: Charlott Rakes, MD;  Location: [facility];  Service: Endoscopy;;   SUBMUCOSAL TATTOO INJECTION  07/12/2021   Procedure: SUBMUCOSAL TATTOO INJECTION;  Surgeon: Charlott Rakes, MD;  Location: [facility];  Service: Endoscopy;;   TUBAL LIGATION      Allergies  Allergies  Allergen Reactions   Celecoxib Nausea And Vomiting   Fosamax [Alendronate Sodium] Nausea And Vomiting   Lipitor [Atorvastatin Calcium]     Fatigue   Valsartan-Hydrochlorothiazide     Other reaction(s): cough   Benazepril     Other reaction(s): Cough   Nickel     Other reaction(s): Other (See Comments) Positive patch test   Palladium Chloride     Other reaction(s): Other (See Comments) Positive patch test   Simvastatin Diarrhea    Other reaction(s): diarrhea   Valsartan     Other reaction(s): Cough    Home Medications    Prior to Admission medications   Medication Sig Start Date End Date Taking? Authorizing Provider  amiodarone (PACERONE) 100 MG tablet Take 1 tablet (100 mg total) by mouth every other day. 01/17/23   Yates Decamp, MD  amLODipine (NORVASC) 5 MG tablet Take 2 tablets (10 mg total) by mouth daily. 03/13/23   Yates Decamp, MD  B Complex Vitamins (B-COMPLEX/B-12) LIQD Place 1 mL under the tongue daily.    [provider]  calcium carbonate (TUMS - DOSED IN MG ELEMENTAL CALCIUM) 500 MG chewable tablet Chew 1 tablet by mouth 2 (two) times daily.    [provider]  Cholecalciferol (VITAMIN D3) 125 MCG (5000 UT) CAPS Take 5,000 Units by mouth daily.    [provider]  ELIQUIS 5 MG TABS tablet TAKE 1 TABLET BY MOUTH TWICE A DAY 04/17/23   Yates Decamp, MD  famotidine (PEPCID) 20 MG tablet Take 20 mg by mouth 2 (two) times daily.    [provider]  furosemide (LASIX) 20 MG tablet TAKE 1 TABLET (20 MG TOTAL) BY MOUTH AS NEEDED FOR FLUID OR EDEMA. 09/06/20   Cantwell, Celeste C, PA-C  losartan (COZAAR) 50 MG tablet Take 50 mg by mouth at bedtime. 12/27/22   [provider]  rosuvastatin (CRESTOR) 20 MG tablet Take 1 tablet (20 mg total) by mouth daily. Patient taking differently: Take 20 mg by mouth at bedtime. 03/13/23   Yates Decamp, MD    Physical Exam    Vital Signs:  Amanda Frazier does not have vital signs available for review today.  Given telephonic nature of communication, physical exam is limited. AAOx3. NAD. Normal affect.  Speech and respirations are unlabored.  Accessory Clinical Findings    None  Assessment & Plan  1.  Preoperative Cardiovascular Risk Assessment:  According to the Revised Cardiac Risk Index (RCRI), her Perioperative Risk of Major Cardiac Event is (%): 0.9. Her Functional Capacity in METs is: 5.59 according to the Duke Activity Status Index (DASI). Therefore, based on ACC/AHA guidelines, patient would be at acceptable risk for the planned procedure without further cardiovascular testing.   The patient was advised that if she develops new symptoms prior to surgery to contact our office to arrange for a follow-up visit, and she verbalized understanding.  Per office protocol, patient can hold Eliquis for 3 days prior to procedure.  Please resume Eliquis as soon as possible postprocedure, at the discretion of the surgeon.    A copy of this note will be routed to requesting surgeon.  Time:   Today, I have spent 5 minutes with the patient with telehealth technology discussing medical  history, symptoms, and management plan.     Joylene Grapes, NP  06/07/2023, 10:47 AM

## 2023-06-10 ENCOUNTER — Encounter: Payer: Self-pay | Admitting: Orthopedic Surgery

## 2023-06-10 DIAGNOSIS — D131 Benign neoplasm of stomach: Secondary | ICD-10-CM | POA: Insufficient documentation

## 2023-06-10 DIAGNOSIS — E539 Vitamin B deficiency, unspecified: Secondary | ICD-10-CM | POA: Insufficient documentation

## 2023-06-10 DIAGNOSIS — I3139 Other pericardial effusion (noninflammatory): Secondary | ICD-10-CM | POA: Insufficient documentation

## 2023-06-10 DIAGNOSIS — H9193 Unspecified hearing loss, bilateral: Secondary | ICD-10-CM | POA: Insufficient documentation

## 2023-06-10 DIAGNOSIS — M810 Age-related osteoporosis without current pathological fracture: Secondary | ICD-10-CM | POA: Insufficient documentation

## 2023-06-10 DIAGNOSIS — I480 Paroxysmal atrial fibrillation: Secondary | ICD-10-CM | POA: Insufficient documentation

## 2023-06-10 DIAGNOSIS — I700 Atherosclerosis of aorta: Secondary | ICD-10-CM | POA: Insufficient documentation

## 2023-06-10 DIAGNOSIS — E8881 Metabolic syndrome: Secondary | ICD-10-CM | POA: Insufficient documentation

## 2023-06-10 DIAGNOSIS — I499 Cardiac arrhythmia, unspecified: Secondary | ICD-10-CM | POA: Insufficient documentation

## 2023-06-10 DIAGNOSIS — E538 Deficiency of other specified B group vitamins: Secondary | ICD-10-CM | POA: Insufficient documentation

## 2023-06-10 DIAGNOSIS — J90 Pleural effusion, not elsewhere classified: Secondary | ICD-10-CM | POA: Insufficient documentation

## 2023-06-10 DIAGNOSIS — E2839 Other primary ovarian failure: Secondary | ICD-10-CM | POA: Insufficient documentation

## 2023-06-10 DIAGNOSIS — I4891 Unspecified atrial fibrillation: Secondary | ICD-10-CM | POA: Insufficient documentation

## 2023-06-10 DIAGNOSIS — J9859 Other diseases of mediastinum, not elsewhere classified: Secondary | ICD-10-CM | POA: Insufficient documentation

## 2023-06-10 DIAGNOSIS — I2510 Atherosclerotic heart disease of native coronary artery without angina pectoris: Secondary | ICD-10-CM | POA: Insufficient documentation

## 2023-06-10 DIAGNOSIS — R739 Hyperglycemia, unspecified: Secondary | ICD-10-CM | POA: Insufficient documentation

## 2023-06-10 DIAGNOSIS — E559 Vitamin D deficiency, unspecified: Secondary | ICD-10-CM | POA: Insufficient documentation

## 2023-06-10 DIAGNOSIS — E7800 Pure hypercholesterolemia, unspecified: Secondary | ICD-10-CM | POA: Insufficient documentation

## 2023-06-10 DIAGNOSIS — R7303 Prediabetes: Secondary | ICD-10-CM | POA: Insufficient documentation

## 2023-06-10 DIAGNOSIS — D6859 Other primary thrombophilia: Secondary | ICD-10-CM | POA: Insufficient documentation

## 2023-06-10 MED ORDER — CHLORHEXIDINE GLUCONATE 0.12 % MT SOLN: 15.0000 mL | Freq: Once | OROMUCOSAL | Status: DC

## 2023-06-10 MED ORDER — CEFAZOLIN SODIUM-DEXTROSE 2-4 GM/100ML-% IV SOLN
2.0000 g | INTRAVENOUS | Status: AC
  Administered 2023-06-11: 2 g via INTRAVENOUS

## 2023-06-10 MED ORDER — CHLORHEXIDINE GLUCONATE 4 % EX SOLN: 60.0000 mL | Freq: Once | CUTANEOUS | Status: DC

## 2023-06-10 MED ORDER — GABAPENTIN 300 MG PO CAPS
300.0000 mg | ORAL_CAPSULE | Freq: Once | ORAL | Status: AC
  Administered 2023-06-11: 300 mg via ORAL

## 2023-06-10 MED ORDER — LACTATED RINGERS IV SOLN: INTRAVENOUS | Status: DC

## 2023-06-10 MED ORDER — TRANEXAMIC ACID-NACL 1000-0.7 MG/100ML-% IV SOLN
1000.0000 mg | INTRAVENOUS | Status: AC
  Administered 2023-06-11: 1000 mg via INTRAVENOUS

## 2023-06-10 MED ORDER — DEXAMETHASONE SODIUM PHOSPHATE 10 MG/ML IJ SOLN
8.0000 mg | Freq: Once | INTRAMUSCULAR | Status: AC
  Administered 2023-06-11: 8 mg via INTRAVENOUS

## 2023-06-10 MED ORDER — ORAL CARE MOUTH RINSE: 15.0000 mL | Freq: Once | OROMUCOSAL | Status: DC

## 2023-06-10 NOTE — H&P (Signed)
ORTHOPAEDIC HISTORY & PHYSICAL Neshawn Aird, Adelina Mings., MD - 05/17/2023 11:00 AM EDT Formatting of this note is different from the original. Images from the original note were not included. Chief Complaint: Chief Complaint Patient presents with Total Joint- Follow Up Left total knee arthroplasty 08/05/14 Right knee degenerative arthrosis  Reason for Visit: The patient is a 77 y.o. female who presents today for reevaluation of both knees. She is a almost 9 years status post left total knee arthroplasty. She denies any significant left knee pain, swelling, locking, or giving way. She continues to be pleased with the left knee.  She reports a long history of progressive right knee pain. She localizes most of the pain along the medial aspect of the knee. She reports some swelling, no locking, and some giving way of the knee. The pain is aggravated by any weight bearing. The knee pain limits the patient's ability to ambulate long distances. The patient has not appreciated any significant improvement despite activity modification. She is unable to tolerate NSAIDs due to anticoagulation with Eliquis and stage III chronic kidney disease. She is not using any ambulatory aids. The patient states that the knee pain has progressed to the point that it is significantly interfering with her activities of daily living.  Medications: Current Outpatient Medications Medication Sig Dispense Refill amiodarone (PACERONE) 100 MG tablet Take 100 mg by mouth every other day amLODIPine (NORVASC) 5 MG tablet Take 5 mg by mouth 2 (two) times daily. 3 ELIQUIS 5 mg tablet Take 5 mg by mouth 2 (two) times daily ergocalciferol, vitamin D2, 50,000 unit capsule Take by mouth once daily. famotidine (PEPCID) 20 MG tablet Take 20 mg by mouth 2 (two) times daily losartan (COZAAR) 50 MG tablet Take 50 mg by mouth at bedtime rosuvastatin (CRESTOR) 20 MG tablet Take 20 mg by mouth once daily vit B2-niacin-B6-B12-dexpanth (B  COMPLEX) 1.7-20-2-1.2 mg/mL Liqd Place 1 mL under the tongue once daily  No current facility-administered medications for this visit.  Allergies: Allergies Allergen Reactions Nickel Sulfate Other (See Comments) Positive patch test Palladium Other (See Comments) Positive patch test  Past Medical History: Past Medical History: Diagnosis Date Atrial fibrillation with RVR (CMS/HHS-HCC) 03/30/2020 Chickenpox Chronic congestive heart failure (CMS/HHS-HCC) 08/15/2021 Hyperlipidemia Hypertension Iron deficiency anemia, unspecified 07/12/2021 Pulmonary hypertension (CMS/HHS-HCC) 08/15/2021 Stage 3 chronic kidney disease (CMS/HHS-HCC) 08/15/2021  Past Surgical History: Past Surgical History: Procedure Laterality Date Left total knee arthroplasty using computer-assisted navigation Left 08/05/14 KNEE ARTHROSCOPY Left  Social History: Social History  Socioeconomic History Marital status: Married Spouse name: Peyton Najjar Number of children: 2 Years of education: 15 Occupational History Occupation: RetiredChief Executive Officer Tobacco Use Smoking status: Never Smokeless tobacco: Never Vaping Use Vaping status: Never Used Substance and Sexual Activity Alcohol use: No Alcohol/week: 0.0 standard drinks of alcohol Drug use: No Sexual activity: Yes Partners: Male  Social Determinants of Health  Financial Resource Strain: Low Risk (05/21/2020) Received from [facility] Overall Financial Resource Strain (CARDIA) Difficulty of Paying Living Expenses: Not very hard Food Insecurity: No Food Insecurity (05/21/2020) Received from [facility] Hunger Vital Sign Worried About Running Out of Food in the Last Year: Never true Ran Out of Food in the Last Year: Never true Transportation Needs: No Transportation Needs (05/21/2020) Received from [facility] - Transportation Lack of Transportation (Medical): No Lack of Transportation (Non-Medical): No Stress: No Stress Concern Present  (05/21/2020) Received from [facility] - Occupational Stress Questionnaire Feeling of Stress : Not at all Received  from [facility]  Family History: Family History Problem Relation Name Age of Onset Heart disease Mother Heart disease Father Lung cancer Sister Leukemia Brother  Review of Systems: A comprehensive 14 point ROS was performed, reviewed, and the pertinent orthopaedic findings are documented in the HPI.  Exam BP 138/64  Ht 162.6 cm (5\' 4" )  Wt 86.7 kg (191 lb 3.2 oz)  BMI 32.82 kg/m  General: Well-developed, well-nourished female seen in no acute distress. Antalgic gait. Varus thrust to the right knee.  HEENT: Atraumatic, normocephalic. Pupils are equal and reactive to light. Extraocular motion is intact. Sclera are clear. Oropharynx is clear with moist mucosa.  Neck: Supple, nontender, and with good ROM. No thyromegaly, adenopathy, JVD, or carotid bruits.  Lungs: Clear to auscultation bilaterally.  Cardiovascular: Regular rate and rhythm. Normal S1, S2. 2/6 murmur . No appreciable gallops or rubs. Peripheral pulses are palpable. No lower extremity edema. Homan`s test is negative.  Abdomen: Soft, nontender, nondistended. Bowel sounds are present.  Extremities: Good strength, stability, and range of motion of the upper extremities. Good range of motion of the hips and ankles.  Right Knee: Soft tissue swelling: mild Effusion: minimal Erythema: none Crepitance: mild Tenderness: medial Alignment: relative varus Mediolateral laxity: medial pseudolaxity Posterior sag: negative Patellar tracking: Good tracking without evidence of subluxation or tilt Atrophy: No significant atrophy. Quadriceps tone was fair to good. Range of motion: 0/6/121 degrees  Left Knee: Soft tissue swelling: none Effusion: none Erythema: none Crepitance: none Tenderness: No focal tenderness Alignment:  normal Mediolateral laxity: stable Atrophy: No significant atrophy. Quadriceps tone was fair to good. Range of Motion: 0/0/130 degrees  Neurologic: Awake, alert, and oriented. Sensory function is intact to pinprick and light touch. Motor strength is judged to be 5/5. Motor coordination is within normal limits. No apparent clonus. No tremor.  X-rays: I ordered and interpreted standing AP, lateral, and sunrise radiographs of the right knee that were obtained in the office today. There is significant narrowing of the medial cartilage space with near bone-on-bone articulation and associated varus alignment. Osteophyte formation is noted. Subchondral sclerosis is noted. No evidence of fracture or dislocation.  I ordered and interpreted standing AP, lateral, and sunrise views of the left knee that were obtained in the office today. Good position of the total knee implants. Good alignment is noted on the AP view. Good cement mantle is appreciated without evidence of loosening. No evidence of polyethylene wear or osteolysis. No evidence of fracture or dislocation.  Impression: Degenerative arthrosis of the right knee Left total knee arthroplasty  Plan: The findings were discussed in detail with the patient. The patient continues to do well with regards to the left knee. The patient was given informational material on total knee replacement. Conservative treatment options were reviewed with the patient. We discussed the risks and benefits of surgical intervention. The usual perioperative course was also discussed in detail. The patient expressed understanding of the risks and benefits of surgical intervention and would like to proceed with plans for left total knee arthroplasty.  Anesthesiology requests that patients stop Eliquis 3 days prior to surgery in order to be considered for regional anesthesia. Anticoagulation would be restarted postoperatively.  I spent a total of 45 minutes in both  face-to-face and non-face-to-face activities, excluding procedures performed, for this visit on the date of this encounter.  MEDICAL CLEARANCE: Per anesthesiology and Dr. Yates Decamp (Cardiology). ACTIVITY: As tolerated. WORK STATUS: Not applicable. THERAPY: Preoperative physical therapy evaluation. MEDICATIONS: Requested Prescriptions  No prescriptions requested or ordered in this encounter  FOLLOW-UP: Return for preop History & Physical pending surgery date.  Armentha Branagan P. [name]., M.D.  This note was generated in part with voice recognition software and I apologize for any typographical errors that were not detected and corrected.  Electronically signed by Shari Heritage., MD at 05/20/2023 1:54 PM EDT

## 2023-06-11 ENCOUNTER — Ambulatory Visit: Payer: Medicare HMO | Admitting: Urgent Care

## 2023-06-11 ENCOUNTER — Observation Stay
Admission: RE | Admit: 2023-06-11 | Discharge: 2023-06-12 | Disposition: A | Discharge status: Home / Self Care | Payer: Medicare HMO | Attending: Orthopedic Surgery | Admitting: Orthopedic Surgery

## 2023-06-11 ENCOUNTER — Encounter: Payer: Self-pay | Admitting: Orthopedic Surgery

## 2023-06-11 ENCOUNTER — Other Ambulatory Visit: Payer: Self-pay

## 2023-06-11 ENCOUNTER — Observation Stay: Payer: Medicare HMO

## 2023-06-11 ENCOUNTER — Encounter
Admission: RE | Disposition: A | Discharge status: Home / Self Care | Payer: Self-pay | Source: Home / Self Care | Attending: Orthopedic Surgery

## 2023-06-11 DIAGNOSIS — Z96659 Presence of unspecified artificial knee joint: Secondary | ICD-10-CM

## 2023-06-11 DIAGNOSIS — Z7901 Long term (current) use of anticoagulants: Secondary | ICD-10-CM | POA: Insufficient documentation

## 2023-06-11 DIAGNOSIS — I4891 Unspecified atrial fibrillation: Secondary | ICD-10-CM | POA: Insufficient documentation

## 2023-06-11 DIAGNOSIS — N183 Chronic kidney disease, stage 3 unspecified: Secondary | ICD-10-CM | POA: Insufficient documentation

## 2023-06-11 DIAGNOSIS — Z79899 Other long term (current) drug therapy: Secondary | ICD-10-CM | POA: Insufficient documentation

## 2023-06-11 DIAGNOSIS — I509 Heart failure, unspecified: Secondary | ICD-10-CM | POA: Insufficient documentation

## 2023-06-11 DIAGNOSIS — M1711 Unilateral primary osteoarthritis, right knee: Principal | ICD-10-CM | POA: Insufficient documentation

## 2023-06-11 DIAGNOSIS — Z96652 Presence of left artificial knee joint: Secondary | ICD-10-CM | POA: Insufficient documentation

## 2023-06-11 DIAGNOSIS — Z01818 Encounter for other preprocedural examination: Secondary | ICD-10-CM

## 2023-06-11 DIAGNOSIS — I130 Hypertensive heart and chronic kidney disease with heart failure and stage 1 through stage 4 chronic kidney disease, or unspecified chronic kidney disease: Secondary | ICD-10-CM | POA: Insufficient documentation

## 2023-06-11 HISTORY — DX: Cataract extraction status, right eye: Z98.42

## 2023-06-11 HISTORY — DX: Cardiac murmur, unspecified: R01.1

## 2023-06-11 HISTORY — PX: KNEE ARTHROPLASTY: SHX992

## 2023-06-11 HISTORY — DX: Cataract extraction status, right eye: Z98.41

## 2023-06-11 HISTORY — DX: Gastro-esophageal reflux disease without esophagitis: K21.9

## 2023-06-11 HISTORY — DX: Iron deficiency anemia, unspecified: D50.9

## 2023-06-11 HISTORY — DX: Atherosclerosis of aorta: I70.0

## 2023-06-11 HISTORY — DX: Polyp of stomach and duodenum: K31.7

## 2023-06-11 HISTORY — DX: Atherosclerotic heart disease of native coronary artery without angina pectoris: I25.10

## 2023-06-11 HISTORY — DX: Unspecified sensorineural hearing loss: H90.5

## 2023-06-11 HISTORY — DX: Long term (current) use of anticoagulants: Z79.01

## 2023-06-11 HISTORY — DX: Other long term (current) drug therapy: Z79.899

## 2023-06-11 SURGERY — ARTHROPLASTY, KNEE, TOTAL, USING IMAGELESS COMPUTER-ASSISTED NAVIGATION
Anesthesia: Spinal | Site: Knee | Laterality: Right

## 2023-06-11 MED ORDER — FUROSEMIDE 20 MG PO TABS: 20.0000 mg | ORAL_TABLET | ORAL | Status: DC | PRN

## 2023-06-11 MED ORDER — ACETAMINOPHEN 325 MG PO TABS: 325.0000 mg | ORAL_TABLET | Freq: Four times a day (QID) | ORAL | Status: DC | PRN

## 2023-06-11 MED ORDER — MENTHOL 3 MG MT LOZG: 1.0000 | LOZENGE | OROMUCOSAL | Status: DC | PRN

## 2023-06-11 MED ORDER — METOCLOPRAMIDE HCL 10 MG PO TABS
10.0000 mg | ORAL_TABLET | Freq: Three times a day (TID) | ORAL | Status: DC
  Administered 2023-06-11 – 2023-06-12 (×3): 10 mg via ORAL

## 2023-06-11 MED ORDER — TRAMADOL HCL 50 MG PO TABS: 50.0000 mg | ORAL_TABLET | ORAL | Status: DC | PRN

## 2023-06-11 MED ORDER — CHLORHEXIDINE GLUCONATE 0.12 % MT SOLN
OROMUCOSAL | Status: AC
  Filled 2023-06-11: qty 15

## 2023-06-11 MED ORDER — CEFAZOLIN SODIUM-DEXTROSE 2-4 GM/100ML-% IV SOLN
INTRAVENOUS | Status: AC
  Filled 2023-06-11: qty 100

## 2023-06-11 MED ORDER — PROPOFOL 500 MG/50ML IV EMUL
INTRAVENOUS | Status: DC | PRN
  Administered 2023-06-11: 50 ug/kg/min via INTRAVENOUS

## 2023-06-11 MED ORDER — OXYCODONE HCL 5 MG PO TABS
5.0000 mg | ORAL_TABLET | ORAL | Status: DC | PRN
  Administered 2023-06-11 – 2023-06-12 (×3): 5 mg via ORAL

## 2023-06-11 MED ORDER — FLEET ENEMA RE ENEM: 1.0000 | ENEMA | Freq: Once | RECTAL | Status: DC | PRN

## 2023-06-11 MED ORDER — MIDAZOLAM HCL 2 MG/2ML IJ SOLN
INTRAMUSCULAR | Status: AC
  Filled 2023-06-11: qty 2

## 2023-06-11 MED ORDER — TRANEXAMIC ACID-NACL 1000-0.7 MG/100ML-% IV SOLN
1000.0000 mg | Freq: Once | INTRAVENOUS | Status: AC
  Administered 2023-06-11: 1000 mg via INTRAVENOUS

## 2023-06-11 MED ORDER — FENTANYL CITRATE (PF) 100 MCG/2ML IJ SOLN: 25.0000 ug | INTRAMUSCULAR | Status: DC | PRN

## 2023-06-11 MED ORDER — MIDAZOLAM HCL 5 MG/5ML IJ SOLN
INTRAMUSCULAR | Status: DC | PRN
  Administered 2023-06-11 (×2): 1 mg via INTRAVENOUS

## 2023-06-11 MED ORDER — METOCLOPRAMIDE HCL 10 MG PO TABS
ORAL_TABLET | ORAL | Status: AC
  Filled 2023-06-11: qty 1

## 2023-06-11 MED ORDER — ROSUVASTATIN CALCIUM 20 MG PO TABS
20.0000 mg | ORAL_TABLET | Freq: Every day | ORAL | Status: DC
  Administered 2023-06-11: 20 mg via ORAL

## 2023-06-11 MED ORDER — SODIUM CHLORIDE 0.9 % IR SOLN
Status: DC | PRN
  Administered 2023-06-11: 3000 mL

## 2023-06-11 MED ORDER — ALUM & MAG HYDROXIDE-SIMETH 200-200-20 MG/5ML PO SUSP: 30.0000 mL | ORAL | Status: DC | PRN

## 2023-06-11 MED ORDER — SODIUM CHLORIDE (PF) 0.9 % IJ SOLN
INTRAMUSCULAR | Status: DC | PRN
  Administered 2023-06-11: 120 mL

## 2023-06-11 MED ORDER — OXYCODONE HCL 5 MG PO TABS
ORAL_TABLET | ORAL | Status: AC
  Filled 2023-06-11: qty 1

## 2023-06-11 MED ORDER — DROPERIDOL 2.5 MG/ML IJ SOLN: 0.6250 mg | Freq: Once | INTRAMUSCULAR | Status: DC | PRN

## 2023-06-11 MED ORDER — ACETAMINOPHEN 10 MG/ML IV SOLN
INTRAVENOUS | Status: DC | PRN
  Administered 2023-06-11: 1000 mg via INTRAVENOUS

## 2023-06-11 MED ORDER — ACETAMINOPHEN 10 MG/ML IV SOLN
1000.0000 mg | Freq: Four times a day (QID) | INTRAVENOUS | Status: DC
  Administered 2023-06-11 – 2023-06-12 (×3): 1000 mg via INTRAVENOUS

## 2023-06-11 MED ORDER — BUPIVACAINE HCL (PF) 0.25 % IJ SOLN
INTRAMUSCULAR | Status: AC
  Filled 2023-06-11: qty 60

## 2023-06-11 MED ORDER — AMLODIPINE BESYLATE 5 MG PO TABS
10.0000 mg | ORAL_TABLET | Freq: Every day | ORAL | Status: DC
  Administered 2023-06-12: 10 mg via ORAL

## 2023-06-11 MED ORDER — PHENYLEPHRINE HCL (PRESSORS) 10 MG/ML IV SOLN
INTRAVENOUS | Status: DC | PRN
  Administered 2023-06-11 (×2): 120 ug via INTRAVENOUS
  Administered 2023-06-11: 160 ug via INTRAVENOUS
  Administered 2023-06-11 (×4): 80 ug via INTRAVENOUS

## 2023-06-11 MED ORDER — EPHEDRINE 5 MG/ML INJ
INTRAVENOUS | Status: AC
  Filled 2023-06-11: qty 5

## 2023-06-11 MED ORDER — BUPIVACAINE LIPOSOME 1.3 % IJ SUSP
INTRAMUSCULAR | Status: AC
  Filled 2023-06-11: qty 20

## 2023-06-11 MED ORDER — PROPOFOL 1000 MG/100ML IV EMUL
INTRAVENOUS | Status: AC
  Filled 2023-06-11: qty 100

## 2023-06-11 MED ORDER — BISACODYL 10 MG RE SUPP: 10.0000 mg | Freq: Every day | RECTAL | Status: DC | PRN

## 2023-06-11 MED ORDER — ONDANSETRON HCL 4 MG/2ML IJ SOLN: 4.0000 mg | Freq: Four times a day (QID) | INTRAMUSCULAR | Status: DC | PRN

## 2023-06-11 MED ORDER — ACETAMINOPHEN 10 MG/ML IV SOLN
INTRAVENOUS | Status: AC
  Filled 2023-06-11: qty 100

## 2023-06-11 MED ORDER — FENTANYL CITRATE (PF) 100 MCG/2ML IJ SOLN
INTRAMUSCULAR | Status: DC | PRN
  Administered 2023-06-11 (×2): 50 ug via INTRAVENOUS

## 2023-06-11 MED ORDER — LOSARTAN POTASSIUM 50 MG PO TABS
50.0000 mg | ORAL_TABLET | Freq: Every day | ORAL | Status: DC
  Administered 2023-06-11: 50 mg via ORAL

## 2023-06-11 MED ORDER — SODIUM CHLORIDE 0.9 % IV SOLN: INTRAVENOUS | Status: DC

## 2023-06-11 MED ORDER — BUPIVACAINE HCL (PF) 0.5 % IJ SOLN
INTRAMUSCULAR | Status: AC
  Filled 2023-06-11: qty 10

## 2023-06-11 MED ORDER — ONDANSETRON HCL 4 MG/2ML IJ SOLN
INTRAMUSCULAR | Status: AC
  Filled 2023-06-11: qty 2

## 2023-06-11 MED ORDER — DEXAMETHASONE SODIUM PHOSPHATE 10 MG/ML IJ SOLN
INTRAMUSCULAR | Status: AC
  Filled 2023-06-11: qty 1

## 2023-06-11 MED ORDER — SENNOSIDES-DOCUSATE SODIUM 8.6-50 MG PO TABS
ORAL_TABLET | ORAL | Status: AC
  Filled 2023-06-11: qty 1

## 2023-06-11 MED ORDER — TRANEXAMIC ACID-NACL 1000-0.7 MG/100ML-% IV SOLN
INTRAVENOUS | Status: AC
  Filled 2023-06-11: qty 100

## 2023-06-11 MED ORDER — CEFAZOLIN SODIUM-DEXTROSE 2-4 GM/100ML-% IV SOLN
2.0000 g | Freq: Four times a day (QID) | INTRAVENOUS | Status: AC
  Administered 2023-06-11 (×2): 2 g via INTRAVENOUS

## 2023-06-11 MED ORDER — FENTANYL CITRATE (PF) 100 MCG/2ML IJ SOLN
INTRAMUSCULAR | Status: AC
  Filled 2023-06-11: qty 2

## 2023-06-11 MED ORDER — BUPIVACAINE HCL (PF) 0.5 % IJ SOLN
INTRAMUSCULAR | Status: DC | PRN
  Administered 2023-06-11: 3 mL via INTRATHECAL

## 2023-06-11 MED ORDER — PHENYLEPHRINE 80 MCG/ML (10ML) SYRINGE FOR IV PUSH (FOR BLOOD PRESSURE SUPPORT)
PREFILLED_SYRINGE | INTRAVENOUS | Status: AC
  Filled 2023-06-11: qty 10

## 2023-06-11 MED ORDER — EPHEDRINE SULFATE (PRESSORS) 50 MG/ML IJ SOLN
INTRAMUSCULAR | Status: DC | PRN
  Administered 2023-06-11 (×2): 5 mg via INTRAVENOUS
  Administered 2023-06-11: 15 mg via INTRAVENOUS
  Administered 2023-06-11: 5 mg via INTRAVENOUS
  Administered 2023-06-11 (×2): 10 mg via INTRAVENOUS

## 2023-06-11 MED ORDER — LOSARTAN POTASSIUM 50 MG PO TABS
ORAL_TABLET | ORAL | Status: AC
  Filled 2023-06-11: qty 1

## 2023-06-11 MED ORDER — DIPHENHYDRAMINE HCL 12.5 MG/5ML PO ELIX: 12.5000 mg | ORAL_SOLUTION | ORAL | Status: DC | PRN

## 2023-06-11 MED ORDER — GLYCOPYRROLATE 0.2 MG/ML IJ SOLN
INTRAMUSCULAR | Status: AC
  Filled 2023-06-11: qty 1

## 2023-06-11 MED ORDER — ENSURE PRE-SURGERY PO LIQD
296.0000 mL | Freq: Once | ORAL | Status: DC
  Filled 2023-06-11: qty 296

## 2023-06-11 MED ORDER — GLYCOPYRROLATE 0.2 MG/ML IJ SOLN
INTRAMUSCULAR | Status: DC | PRN
  Administered 2023-06-11: .2 mg via INTRAVENOUS

## 2023-06-11 MED ORDER — APIXABAN 2.5 MG PO TABS
5.0000 mg | ORAL_TABLET | Freq: Two times a day (BID) | ORAL | Status: DC
  Administered 2023-06-12: 5 mg via ORAL

## 2023-06-11 MED ORDER — OXYCODONE HCL 5 MG PO TABS: 5.0000 mg | ORAL_TABLET | ORAL | Status: DC | PRN

## 2023-06-11 MED ORDER — ONDANSETRON HCL 4 MG PO TABS: 4.0000 mg | ORAL_TABLET | Freq: Four times a day (QID) | ORAL | Status: DC | PRN

## 2023-06-11 MED ORDER — PANTOPRAZOLE SODIUM 40 MG PO TBEC
DELAYED_RELEASE_TABLET | ORAL | Status: AC
  Filled 2023-06-11: qty 1

## 2023-06-11 MED ORDER — MAGNESIUM HYDROXIDE 400 MG/5ML PO SUSP
30.0000 mL | Freq: Every day | ORAL | Status: DC
  Administered 2023-06-12: 30 mL via ORAL

## 2023-06-11 MED ORDER — SENNOSIDES-DOCUSATE SODIUM 8.6-50 MG PO TABS
1.0000 | ORAL_TABLET | Freq: Two times a day (BID) | ORAL | Status: DC
  Administered 2023-06-11 – 2023-06-12 (×2): 1 via ORAL

## 2023-06-11 MED ORDER — PANTOPRAZOLE SODIUM 40 MG PO TBEC
40.0000 mg | DELAYED_RELEASE_TABLET | Freq: Two times a day (BID) | ORAL | Status: DC
  Administered 2023-06-11 – 2023-06-12 (×2): 40 mg via ORAL

## 2023-06-11 MED ORDER — GABAPENTIN 300 MG PO CAPS
ORAL_CAPSULE | ORAL | Status: AC
  Filled 2023-06-11: qty 1

## 2023-06-11 MED ORDER — BUPIVACAINE HCL (PF) 0.5 % IJ SOLN: INTRAMUSCULAR | Status: DC | PRN

## 2023-06-11 MED ORDER — AMIODARONE HCL 200 MG PO TABS: 100.0000 mg | ORAL_TABLET | ORAL | Status: DC

## 2023-06-11 MED ORDER — SODIUM CHLORIDE FLUSH 0.9 % IV SOLN
INTRAVENOUS | Status: AC
  Filled 2023-06-11: qty 40

## 2023-06-11 MED ORDER — FERROUS SULFATE 325 (65 FE) MG PO TABS
325.0000 mg | ORAL_TABLET | Freq: Two times a day (BID) | ORAL | Status: DC
  Administered 2023-06-12: 325 mg via ORAL

## 2023-06-11 MED ORDER — ROSUVASTATIN CALCIUM 20 MG PO TABS
ORAL_TABLET | ORAL | Status: AC
  Filled 2023-06-11: qty 1

## 2023-06-11 MED ORDER — HYDROMORPHONE HCL 1 MG/ML IJ SOLN: 0.5000 mg | INTRAMUSCULAR | Status: DC | PRN

## 2023-06-11 MED ORDER — PHENOL 1.4 % MT LIQD: 1.0000 | OROMUCOSAL | Status: DC | PRN

## 2023-06-11 MED ORDER — SURGIPHOR WOUND IRRIGATION SYSTEM - OPTIME: TOPICAL | Status: DC | PRN

## 2023-06-11 MED ORDER — OXYCODONE HCL 5 MG PO TABS: 10.0000 mg | ORAL_TABLET | ORAL | Status: DC | PRN

## 2023-06-11 SURGICAL SUPPLY — 76 items
ATTUNE PSFEM RTSZ5 NARCEM KNEE (Femur) IMPLANT
ATTUNE PSRP INSR SZ5 6 KNEE (Insert) IMPLANT
BASE TIBIAL ROT PLAT SZ 5 KNEE (Knees) IMPLANT
BATTERY INSTRU NAVIGATION (MISCELLANEOUS) ×4 IMPLANT
BIT DRILL QUICK REL 1/8 2PK SL (BIT) ×1 IMPLANT
BLADE SAW 70X12.5 (BLADE) ×1 IMPLANT
BLADE SAW 90X13X1.19 OSCILLAT (BLADE) ×1 IMPLANT
BLADE SAW 90X25X1.19 OSCILLAT (BLADE) ×1 IMPLANT
BONE CEMENT GENTAMICIN (Cement) ×2 IMPLANT
BRUSH SCRUB EZ PLAIN DRY (MISCELLANEOUS) ×1 IMPLANT
BSPLAT TIB 5 CMNT ROT PLAT STR (Knees) ×1 IMPLANT
BTRY SRG DRVR LF (MISCELLANEOUS) ×4
CEMENT BONE GENTAMICIN 40 (Cement) IMPLANT
COOLER POLAR GLACIER W/PUMP (MISCELLANEOUS) ×1 IMPLANT
CUFF TOURN SGL QUICK 24 (TOURNIQUET CUFF)
CUFF TOURN SGL QUICK 30 (TOURNIQUET CUFF) ×1
CUFF TRNQT CYL 24X4X16.5-23 (TOURNIQUET CUFF) IMPLANT
CUFF TRNQT CYL 30X4X21-28X (TOURNIQUET CUFF) IMPLANT
DRAPE INCISE IOBAN 66X45 STRL (DRAPES) IMPLANT
DRAPE SHEET LG 3/4 BI-LAMINATE (DRAPES) ×1 IMPLANT
DRSG AQUACEL AG ADV 3.5X10 (GAUZE/BANDAGES/DRESSINGS) IMPLANT
DRSG AQUACEL AG ADV 3.5X14 (GAUZE/BANDAGES/DRESSINGS) ×1 IMPLANT
DRSG MEPILEX SACRM 8.7X9.8 (GAUZE/BANDAGES/DRESSINGS) ×1 IMPLANT
DRSG TEGADERM 4X4.75 (GAUZE/BANDAGES/DRESSINGS) ×1 IMPLANT
DURAPREP 26ML APPLICATOR (WOUND CARE) ×2 IMPLANT
ELECT CAUTERY BLADE 6.4 (BLADE) ×1 IMPLANT
ELECT REM PT RETURN 9FT ADLT (ELECTROSURGICAL) ×1
ELECTRODE REM PT RTRN 9FT ADLT (ELECTROSURGICAL) ×1 IMPLANT
EVACUATOR 1/8 PVC DRAIN (DRAIN) ×1 IMPLANT
EX-PIN ORTHOLOCK NAV 4X150 (PIN) ×2 IMPLANT
GAUZE XEROFORM 1X8 LF (GAUZE/BANDAGES/DRESSINGS) ×1 IMPLANT
GLOVE BIOGEL M STRL SZ7.5 (GLOVE) ×4 IMPLANT
GLOVE SRG 8 PF TXTR STRL LF DI (GLOVE) ×2 IMPLANT
GLOVE SURG UNDER POLY LF SZ8 (GLOVE) ×2
GOWN STRL REUS W/ TWL LRG LVL3 (GOWN DISPOSABLE) ×1 IMPLANT
GOWN STRL REUS W/ TWL XL LVL3 (GOWN DISPOSABLE) ×1 IMPLANT
GOWN STRL REUS W/TWL LRG LVL3 (GOWN DISPOSABLE) ×1
GOWN STRL REUS W/TWL XL LVL3 (GOWN DISPOSABLE) ×1
GOWN TOGA ZIPPER T7+ PEEL AWAY (MISCELLANEOUS) ×1 IMPLANT
HANDLE YANKAUER SUCT OPEN TIP (MISCELLANEOUS) ×1 IMPLANT
HOLDER FOLEY CATH W/STRAP (MISCELLANEOUS) ×1 IMPLANT
HOOD PEEL AWAY T7 (MISCELLANEOUS) ×1 IMPLANT
IV NS IRRIG 3000ML ARTHROMATIC (IV SOLUTION) ×1 IMPLANT
KIT TURNOVER KIT A (KITS) ×1 IMPLANT
KNIFE SCULPS 14X20 (INSTRUMENTS) ×1 IMPLANT
MANIFOLD NEPTUNE II (INSTRUMENTS) ×2 IMPLANT
NDL SPNL 20GX3.5 QUINCKE YW (NEEDLE) ×2 IMPLANT
NEEDLE SPNL 20GX3.5 QUINCKE YW (NEEDLE) ×2 IMPLANT
PACK TOTAL KNEE (MISCELLANEOUS) ×1 IMPLANT
PAD ABD DERMACEA PRESS 5X9 (GAUZE/BANDAGES/DRESSINGS) ×2 IMPLANT
PAD ARMBOARD 7.5X6 YLW CONV (MISCELLANEOUS) ×3 IMPLANT
PAD WRAPON POLAR KNEE (MISCELLANEOUS) ×1 IMPLANT
PATELLA MEDIAL ATTUN 35MM KNEE (Knees) IMPLANT
PENCIL SMOKE EVACUATOR COATED (MISCELLANEOUS) ×1 IMPLANT
PIN DRILL FIX HALF THREAD (BIT) ×2 IMPLANT
PIN FIXATION 1/8DIA X 3INL (PIN) ×1 IMPLANT
PULSAVAC PLUS IRRIG FAN TIP (DISPOSABLE) ×1
SOLUTION IRRIG SURGIPHOR (IV SOLUTION) ×1 IMPLANT
SPONGE DRAIN TRACH 4X4 STRL 2S (GAUZE/BANDAGES/DRESSINGS) ×1 IMPLANT
STAPLER SKIN PROX 35W (STAPLE) ×1 IMPLANT
STOCKINETTE IMPERV 14X48 (MISCELLANEOUS) ×1 IMPLANT
STRAP TIBIA SHORT (MISCELLANEOUS) ×1 IMPLANT
SUCTION TUBE FRAZIER 10FR DISP (SUCTIONS) ×1 IMPLANT
SUT VIC AB 0 CT1 36 (SUTURE) ×1 IMPLANT
SUT VIC AB 1 CT1 36 (SUTURE) ×2 IMPLANT
SUT VIC AB 2-0 CT2 27 (SUTURE) ×1 IMPLANT
SYR 30ML LL (SYRINGE) ×2 IMPLANT
TIBIAL BASE ROT PLAT SZ 5 KNEE (Knees) ×1 IMPLANT
TIP FAN IRRIG PULSAVAC PLUS (DISPOSABLE) ×1 IMPLANT
TOWEL OR 17X26 4PK STRL BLUE (TOWEL DISPOSABLE) IMPLANT
TOWER CARTRIDGE SMART MIX (DISPOSABLE) ×1 IMPLANT
TRAP FLUID SMOKE EVACUATOR (MISCELLANEOUS) ×1 IMPLANT
TRAY FOLEY MTR SLVR 16FR STAT (SET/KITS/TRAYS/PACK) ×1 IMPLANT
TUBING CONNECTING 10 (TUBING) ×2 IMPLANT
WATER STERILE IRR 1000ML POUR (IV SOLUTION) ×1 IMPLANT
WRAPON POLAR PAD KNEE (MISCELLANEOUS) ×1

## 2023-06-11 NOTE — Op Note (Signed)
OPERATIVE NOTE  DATE OF SURGERY:  06/11/2023  PATIENT NAME:  CHRISIE BOYER   DOB: [birth year]  MRN: [id number]  PRE-OPERATIVE DIAGNOSIS: Degenerative arthrosis of the right knee, primary  POST-OPERATIVE DIAGNOSIS:  Same  PROCEDURE:  Right total knee arthroplasty using computer-assisted navigation  SURGEON:  Jena Gauss. M.D.  ASSISTANT:  Gean Birchwood, PA-C (present and scrubbed throughout the case, critical for assistance with exposure, retraction, instrumentation, and closure)  ANESTHESIA: spinal  ESTIMATED BLOOD LOSS: 50 mL  FLUIDS REPLACED: 900 mL of crystalloid  TOURNIQUET TIME: 87 minutes  DRAINS: 2 medium Hemovac drains  SOFT TISSUE RELEASES: Anterior cruciate ligament, posterior cruciate ligament, deep medial collateral ligament, patellofemoral ligament  IMPLANTS UTILIZED: DePuy Attune size 5N posterior stabilized femoral component (cemented), size 5 rotating platform tibial component (cemented), 35 mm medialized dome patella (cemented), and a 6 mm stabilized rotating platform polyethylene insert.  INDICATIONS FOR SURGERY: ENVY LUST is a 77 y.o. year old female with a long history of progressive knee pain. X-rays demonstrated severe degenerative changes in tricompartmental fashion. The patient had not seen any significant improvement despite conservative nonsurgical intervention. After discussion of the risks and benefits of surgical intervention, the patient expressed understanding of the risks benefits and agree with plans for total knee arthroplasty.   The risks, benefits, and alternatives were discussed at length including but not limited to the risks of infection, bleeding, nerve injury, stiffness, blood clots, the need for revision surgery, cardiopulmonary complications, among others, and they were willing to proceed.  PROCEDURE IN DETAIL: The patient was brought into the operating room and, after adequate spinal anesthesia was achieved, a tourniquet  was placed on the patient's upper thigh. The patient's knee and leg were cleaned and prepped with alcohol and DuraPrep and draped in the usual sterile fashion. A "timeout" was performed as per usual protocol. The lower extremity was exsanguinated using an Esmarch, and the tourniquet was inflated to 300 mmHg. An anterior longitudinal incision was made followed by a standard mid vastus approach. The deep fibers of the medial collateral ligament were elevated in a subperiosteal fashion off of the medial flare of the tibia so as to maintain a continuous soft tissue sleeve. The patella was subluxed laterally and the patellofemoral ligament was incised. Inspection of the knee demonstrated severe degenerative changes with full-thickness loss of articular cartilage. Osteophytes were debrided using a rongeur. Anterior and posterior cruciate ligaments were excised. Two 4.0 mm Schanz pins were inserted in the femur and into the tibia for attachment of the array of trackers used for computer-assisted navigation. Hip center was identified using a circumduction technique. Distal landmarks were mapped using the computer. The distal femur and proximal tibia were mapped using the computer. The distal femoral cutting guide was positioned using computer-assisted navigation so as to achieve a 5 distal valgus cut. The femur was sized and it was felt that a size 5N femoral component was appropriate. A size 5 femoral cutting guide was positioned and the anterior cut was performed and verified using the computer. This was followed by completion of the posterior and chamfer cuts. Femoral cutting guide for the central box was then positioned in the center box cut was performed.  Attention was then directed to the proximal tibia. Medial and lateral menisci were excised. The extramedullary tibial cutting guide was positioned using computer-assisted navigation so as to achieve a 0 varus-valgus alignment and 3 posterior slope. The cut was  performed and verified using the computer. The proximal tibia  was sized and it was felt that a size 5 tibial tray was appropriate. Tibial and femoral trials were inserted followed by insertion of a 6 mm polyethylene insert. This allowed for excellent mediolateral soft tissue balancing both in flexion and in full extension. Finally, the patella was cut and prepared so as to accommodate a 35 mm medialized dome patella. A patella trial was placed and the knee was placed through a range of motion with excellent patellar tracking appreciated. The femoral trial was removed after debridement of posterior osteophytes. The central post-hole for the tibial component was reamed followed by insertion of a keel punch. Tibial trials were then removed. Cut surfaces of bone were irrigated with copious amounts of normal saline using pulsatile lavage and then suctioned dry. Polymethylmethacrylate cement with gentamicin was prepared in the usual fashion using a vacuum mixer. Cement was applied to the cut surface of the proximal tibia as well as along the undersurface of a size 5 rotating platform tibial component. Tibial component was positioned and impacted into place. Excess cement was removed using Personal assistant. Cement was then applied to the cut surfaces of the femur as well as along the posterior flanges of the size 5N femoral component. The femoral component was positioned and impacted into place. Excess cement was removed using Personal assistant. A 6 mm polyethylene trial was inserted and the knee was brought into full extension with steady axial compression applied. Finally, cement was applied to the backside of a 35 mm medialized dome patella and the patellar component was positioned and patellar clamp applied. Excess cement was removed using Personal assistant. After adequate curing of the cement, the tourniquet was deflated after a total tourniquet time of 87 minutes. Hemostasis was achieved using electrocautery. The knee was  irrigated with copious amounts of normal saline using pulsatile lavage followed by 450 ml of Surgiphor and then suctioned dry. 20 mL of 1.3% Exparel and 60 mL of 0.25% Marcaine in 40 mL of normal saline was injected along the posterior capsule, medial and lateral gutters, and along the arthrotomy site. A 6 mm stabilized rotating platform polyethylene insert was inserted and the knee was placed through a range of motion with excellent mediolateral soft tissue balancing appreciated and excellent patellar tracking noted. 2 medium drains were placed in the wound bed and brought out through separate stab incisions. The medial parapatellar portion of the incision was reapproximated using interrupted sutures of #1 Vicryl. Subcutaneous tissue was approximated in layers using first #0 Vicryl followed #2-0 Vicryl. The skin was approximated with skin staples. A sterile dressing was applied.  The patient tolerated the procedure well and was transported to the recovery room in stable condition.    Nalaysia Manganiello P. [name]., M.D.

## 2023-06-11 NOTE — Progress Notes (Signed)
Patient is not able to walk the distance required to go the bathroom, or he/she is unable to safely negotiate stairs required to access the bathroom.  A 3in1 BSC will alleviate this problem   Amanda Frazier [name] [name] M.D.

## 2023-06-11 NOTE — Transfer of Care (Signed)
Immediate Anesthesia Transfer of Care Note  Patient: Amanda Frazier  Procedure(s) Performed: COMPUTER ASSISTED TOTAL KNEE ARTHROPLASTY (Right: Knee)  Patient Location: PACU  Anesthesia Type:Spinal  Level of Consciousness: awake  Airway & Oxygen Therapy: Patient Spontanous Breathing  Post-op Assessment: Report given to RN and Post -op Vital signs reviewed and stable  Post vital signs: Reviewed  Last Vitals:  Vitals Value Taken Time  BP 102/60   Temp    Pulse 80 06/11/23 1055  Resp 23 06/11/23 1055  SpO2 95 06/11/23 1055  Vitals shown include unfiled device data.  Last Pain:         Complications: No notable events documented.

## 2023-06-11 NOTE — Plan of Care (Signed)
  Problem: Pain Management: Goal: Pain level will decrease with appropriate interventions Outcome: Progressing   

## 2023-06-11 NOTE — Evaluation (Signed)
Physical Therapy Evaluation Patient Details Name: Amanda Frazier MRN: [id number] DOB: [birth year] Today's Date: 06/11/2023  History of Present Illness  Pt is s/p R TKA on 10/14.  Pt has PMH of: arthritis, Afib, CAD, CHF, CKD, and HTN.   Clinical Impression  Pt received in Semi-Fowler's position and agreeable to therapy.  Pt currently living at home with all medical devices taken care of and 24/7 support from husband, and son who lives in [place] if needed.  Pt performed bed mobility without any complications and then ambulated around the nursing station.  Pt current supports the idea of not needing to perform stairs as she avoids them completely.  Pt did have one episode of LOB when looking to the left and not watching forward.  Pt drifted to the R and became unsteady, requiring modA from therapist to assist to coming back to neutral.  Pt otherwise stable throughout and performed well.  Continue to monitor as nausea has been somewhat of a complication.  Pt left with all needs met and in recliner with knee straightened.  Pt denying any pain.      If plan is discharge home, recommend the following: A little help with walking and/or transfers;A little help with bathing/dressing/bathroom;Assist for transportation;Help with stairs or ramp for entrance   Can travel by private vehicle        Equipment Recommendations None recommended by PT  Recommendations for Other Services       Functional Status Assessment Patient has had a recent decline in their functional status and demonstrates the ability to make significant improvements in function in a reasonable and predictable amount of time.     Precautions / Restrictions Precautions Precautions: Knee Precaution Booklet Issued: Yes (comment) Restrictions Weight Bearing Restrictions: No      Mobility  Bed Mobility Overal bed mobility: Needs Assistance Bed Mobility: Supine to Sit     Supine to sit: Contact guard     General bed  mobility comments: pt returned to recliner upon conclusion of session.    Transfers Overall transfer level: Needs assistance Equipment used: Rolling walker (2 wheels) Transfers: Sit to/from Stand Sit to Stand: Contact guard assist                Ambulation/Gait Ambulation/Gait assistance: Contact guard assist Gait Distance (Feet): 200 Feet Assistive device: Rolling walker (2 wheels) Gait Pattern/deviations: WFL(Within Functional Limits) Gait velocity: decreased     General Gait Details: pt with steady gait, one episode of LOB that required modA from therapist to correct, but overall is stable.  Stairs            Wheelchair Mobility     Tilt Bed    Modified Rankin (Stroke Patients Only)       Balance Overall balance assessment: Needs assistance Sitting-balance support: No upper extremity supported, Feet supported Sitting balance-Leahy Scale: Good     Standing balance support: Bilateral upper extremity supported, During functional activity, Reliant on assistive device for balance Standing balance-Leahy Scale: Fair                               Pertinent Vitals/Pain Pain Assessment Pain Assessment: No/denies pain    Home Living Family/patient expects to be discharged to:: Private residence Living Arrangements: Spouse/significant other Available Help at Discharge: Family;Available 24 hours/day Type of Home: House Home Access: Ramped entrance     Alternate Level Stairs-Number of Steps: 15 Home Layout: Two level;Able to  live on main level with bedroom/bathroom Home Equipment: Grab bars - tub/shower;Shower seat - built in;Hand held shower head;Cane - single Librarian, academic (2 wheels);Standard Walker;Rollator (4 wheels)      Prior Function Prior Level of Function : Independent/Modified Independent                     Extremity/Trunk Assessment   Upper Extremity Assessment Upper Extremity Assessment: Overall WFL for tasks  assessed    Lower Extremity Assessment Lower Extremity Assessment: RLE deficits/detail RLE Deficits / Details: decreased strength due to surgical intervention.       Communication   Communication Communication: No apparent difficulties  Cognition Arousal: Alert Behavior During Therapy: WFL for tasks assessed/performed                                            General Comments      Exercises     Assessment/Plan    PT Assessment Patient needs continued PT services  PT Problem List Decreased strength;Decreased range of motion;Decreased activity tolerance;Decreased balance;Decreased mobility;Decreased knowledge of use of DME;Obesity;Pain       PT Treatment Interventions DME instruction;Gait training;Stair training;Functional mobility training;Therapeutic activities;Therapeutic exercise;Balance training;Neuromuscular re-education    PT Goals (Current goals can be found in the Care Plan section)  Acute Rehab PT Goals Patient Stated Goal: to feel better and go home. PT Goal Formulation: With patient Time For Goal Achievement: 06/25/23 Potential to Achieve Goals: Good    Frequency BID     Co-evaluation               AM-PAC PT "6 Clicks" Mobility  Outcome Measure Help needed turning from your back to your side while in a flat bed without using bedrails?: A Little Help needed moving from lying on your back to sitting on the side of a flat bed without using bedrails?: A Little Help needed moving to and from a bed to a chair (including a wheelchair)?: A Little Help needed standing up from a chair using your arms (e.g., wheelchair or bedside chair)?: A Little Help needed to walk in hospital room?: A Little Help needed climbing 3-5 steps with a railing? : A Lot 6 Click Score: 17    End of Session Equipment Utilized During Treatment: Gait belt Activity Tolerance: Patient tolerated treatment well Patient left: in chair;with call bell/phone within  reach;with nursing/sitter in room Nurse Communication: Mobility status PT Visit Diagnosis: Unsteadiness on feet (R26.81);Other abnormalities of gait and mobility (R26.89);Muscle weakness (generalized) (M62.81);Difficulty in walking, not elsewhere classified (R26.2);Pain Pain - Right/Left: Right Pain - part of body: Knee    Time: 8657-8469 PT Time Calculation (min) (ACUTE ONLY): 23 min   Charges:   PT Evaluation $PT Eval Low Complexity: 1 Low PT Treatments $Therapeutic Activity: 8-22 mins PT General Charges $$ ACUTE PT VISIT: 1 Visit         Nolon Bussing, PT, DPT Physical Therapist - [facility]  06/11/23, 6:19 PM

## 2023-06-11 NOTE — Anesthesia Procedure Notes (Signed)
Spinal  Patient location during procedure: OR Reason for block: surgical anesthesia Staffing Performed: resident/CRNA  Anesthesiologist: Lenard Simmer, MD Resident/CRNA: Mathews Argyle, CRNA Performed by: Mathews Argyle, CRNA Authorized by: Lenard Simmer, MD   Preanesthetic Checklist Completed: patient identified, IV checked, site marked, risks and benefits discussed, surgical consent, monitors and equipment checked, pre-op evaluation and timeout performed Spinal Block Patient position: sitting Prep: ChloraPrep and site prepped and draped Patient monitoring: heart rate, continuous pulse ox, blood pressure and cardiac monitor Approach: midline Location: L4-5 Injection technique: single-shot Needle Needle type: Whitacre and Introducer  Needle gauge: 24 G Needle length: 9 cm Assessment Events: CSF return Additional Notes Negative paresthesia. Negative blood return. Positive free-flowing CSF. Expiration date of kit checked and confirmed. Patient tolerated procedure well, without complications.

## 2023-06-11 NOTE — Progress Notes (Signed)
PT Cancellation Note  Patient Details Name: Amanda Frazier MRN: [id number] DOB: 07/07/1946   Cancelled Treatment:    Reason Eval/Treat Not Completed: Other (comment).  Chart reviewed and attempted to see pt.  Pt current vomiting and feeling nauseous and requesting for therapy to return.  Pt will re-attempt at later time as time allows.     Nolon Bussing, PT, DPT Physical Therapist - [facility]  06/11/23, 2:46 PM

## 2023-06-11 NOTE — Discharge Summary (Signed)
Physician Discharge Summary  Subjective: Day of Surgery Procedure(s) (LRB): COMPUTER ASSISTED TOTAL KNEE ARTHROPLASTY (Right) Patient reports pain as {pain:3041404}.   Patient seen in rounds with Dr. Ernest Pine. Patient is well, and has had no acute complaints or problems. Denies any SOB, CP, N/V, fevers or chills. We will start therapy today.  Patient is ready to go home  Physician Discharge Summary  Patient ID: Amanda Frazier MRN: [id number] DOB/AGE: 14-Nov-1945 77 y.o.  Admit date: 06/11/2023 Discharge date: 06/11/2023  Admission Diagnoses:  Discharge Diagnoses:  Principal Problem:   Total knee replacement status   Discharged Condition: good  Hospital Course: ***  PROCEDURE:  Right total knee arthroplasty using computer-assisted navigation   SURGEON:  Jena Gauss. M.D.   ASSISTANT:  Gean Birchwood, PA-C (present and scrubbed throughout the case, critical for assistance with exposure, retraction, instrumentation, and closure)   ANESTHESIA: spinal   ESTIMATED BLOOD LOSS: 50 mL   FLUIDS REPLACED: 900 mL of crystalloid   TOURNIQUET TIME: 87 minutes   DRAINS: 2 medium Hemovac drains   SOFT TISSUE RELEASES: Anterior cruciate ligament, posterior cruciate ligament, deep medial collateral ligament, patellofemoral ligament   IMPLANTS UTILIZED: DePuy Attune size 5N posterior stabilized femoral component (cemented), size 5 rotating platform tibial component (cemented), 35 mm medialized dome patella (cemented), and a 6 mm stabilized rotating platform polyethylene insert.  Treatments: none  Discharge Exam: Blood pressure 119/64, pulse 82, temperature 97.9 F (36.6 C), temperature source Temporal, resp. rate 19, height 5\' 3"  (1.6 m), weight 86.6 kg, SpO2 91%.   Disposition: home   Allergies as of 06/11/2023       Reactions   Celecoxib Nausea And Vomiting   Fosamax [alendronate Sodium] Nausea And Vomiting   Lipitor [atorvastatin Calcium]    Fatigue    Valsartan-hydrochlorothiazide    Other reaction(s): cough   Benazepril    Other reaction(s): Cough   Nickel    Other reaction(s): Other (See Comments) Positive patch test   Palladium Chloride    Other reaction(s): Other (See Comments) Positive patch test   Simvastatin Diarrhea   Other reaction(s): diarrhea   Valsartan    Other reaction(s): Cough     Med Rec must be completed prior to using this [facility]***        Durable Medical Equipment  (From admission, onward)           Start     Ordered   06/11/23 1235  DME Walker rolling  Once       Question:  Patient needs a walker to treat with the following condition  Answer:  Total knee replacement status   06/11/23 1235   06/11/23 1235  DME Bedside commode  Once       Comments: Patient is not able to walk the distance required to go the bathroom, or he/she is unable to safely negotiate stairs required to access the bathroom.  A 3in1 BSC will alleviate this problem  Question:  Patient needs a bedside commode to treat with the following condition  Answer:  Total knee replacement status   06/11/23 1235            Follow-up Information     Rayburn Go, PA-C Follow up on 06/26/2023.   Specialty: Orthopedic Surgery Why: at 1:45pm Contact information: [address] [place] [address] [phone]         Donato Heinz, MD Follow up on 07/31/2023.   Specialty: Orthopedic Surgery Why: at 10:30am Contact information: [address] [facility] [place] [address] [phone]                 Signed: Gean Birchwood 06/11/2023, 4:52 PM   Objective: Vital signs in last 24 hours: Temp:  [97.1 F (36.2 C)-98.5 F (36.9 C)] 97.9 F (36.6 C) (10/14 1227) Pulse Rate:  [56-82] 82 (10/14 1227) Resp:  [15-25] 19 (10/14 1227) BP: (99-159)/(44-64) 119/64 (10/14 1227) SpO2:  [90 %-100 %] 91 % (10/14 1227) Weight:  [86.6 kg] 86.6 kg (10/14 0636)  Intake/Output from previous  day:  Intake/Output Summary (Last 24 hours) at 06/11/2023 1652 Last data filed at 06/11/2023 1502 Gross per 24 hour  Intake 1630 ml  Output 175 ml  Net 1455 ml    Intake/Output this shift: Total I/O In: 1630 [P.O.:180; I.V.:950; IV Piggyback:500] Out: 175 [Urine:100; Drains:25; Blood:50]  Labs: No results for input(s): "HGB" in the last 72 hours. No results for input(s): "WBC", "RBC", "HCT", "PLT" in the last 72 hours. No results for input(s): "NA", "K", "CL", "CO2", "BUN", "CREATININE", "GLUCOSE", "CALCIUM" in the last 72 hours. No results for input(s): "LABPT", "INR" in the last 72 hours.  EXAM: General - Patient is Alert, Appropriate, and Oriented Extremity - Neurologically intact ABD soft Neurovascular intact Sensation intact distally Intact pulses distally Dorsiflexion/Plantar flexion intact No cellulitis present Compartment soft Dressing - dressing C/D/I Motor Function - intact, moving foot and toes well on exam. Patient able to plantar and dorsiflex with good strength and ROM. JP Drain pulled without difficulty. Intact  Assessment/Plan: Day of Surgery Procedure(s) (LRB): COMPUTER ASSISTED TOTAL KNEE ARTHROPLASTY (Right) Procedure(s) (LRB): COMPUTER ASSISTED TOTAL KNEE ARTHROPLASTY (Right) Past Medical History:  Diagnosis Date   Aortic atherosclerosis (HCC)    Arthritis    Atrial fibrillation (HCC)    a.) CHA2DS2-VASc = 6 (age x2, sex, CHF, HTN, vascular disease history) as of 06/07/2023; b.) s/p DCCV at [facility] 05/12/2020 (200 J x 1, 360 J x1) --> NSR; c.) s/p DCCV at [facility] 05/18/2020 (150 J x 1, 200 J x1) --> NSR; d.) cardiac rate/rhythm maintained on oral amiodarone; chronically anticoagulated using apixaban   CAD (coronary artery disease)    CHF (congestive heart failure) (HCC)    a.) TTE 10/27/2019: EF 55%, inf/inferolat breast attenuation; b.) TTE 11/05/2019: EF 50-55%, no RWMAs, G1DD, mild MR/PR; c.) TTE 07/29/2020: EF 50-55%, mild LVH, G1DD, mod LAE, mod  MR, mild-mod TR, PASP 44   CKD (chronic kidney disease) stage 3, GFR 30-59 ml/min (HCC)    Gastric polyps    GERD (gastroesophageal reflux disease)    History of 2019 novel coronavirus disease (COVID-19) 03/30/2020   History of bilateral cataract extraction    Hyperlipidemia    Hypertension    IDA (iron deficiency anemia)    Long term current use of amiodarone    On apixaban therapy    Pulmonary hypertension (HCC) 06/07/2020   a.) CT chest 06/07/2020: dilated main PA suggesting pHTN; b.)TTE 07/29/2020: PASP 44 mmHg   SNHL (sensorineural hearing loss)    Systolic murmur    Principal Problem:   Total knee replacement status  Estimated body mass index is 33.83 kg/m as calculated from the following:   Height as of this encounter: 5\' 3"  (1.6 m).   Weight as of this encounter: 86.6 kg. Advance diet Up with therapy Diet - Regular diet Follow up - in 2 weeks Activity - WBAT Disposition - Home Condition Upon Discharge - Good DVT Prophylaxis - TED hose and Apixaban  Danise Edge, PA-C Orthopaedic Surgery 06/11/2023, 4:52 PM

## 2023-06-11 NOTE — Interval H&P Note (Signed)
History and Physical Interval Note:  06/11/2023 6:12 AM  Amanda Frazier  has presented today for surgery, with the diagnosis of PRIMARY OSTREOARTHRITIS OF RIGHT KNEE..  The various methods of treatment have been discussed with the patient and family. After consideration of risks, benefits and other options for treatment, the patient has consented to  Procedure(s): COMPUTER ASSISTED TOTAL KNEE ARTHROPLASTY (Right) as a surgical intervention.  The patient's history has been reviewed, patient examined, no change in status, stable for surgery.  I have reviewed the patient's chart and labs.  Questions were answered to the patient's satisfaction.     [name]

## 2023-06-11 NOTE — Anesthesia Preprocedure Evaluation (Signed)
Anesthesia Evaluation  Patient identified by MRN, date of birth, ID band Patient awake    Reviewed: Allergy & Precautions, H&P , NPO status , Patient's Chart, lab work & pertinent test results, reviewed documented beta blocker date and time   History of Anesthesia Complications Negative for: history of anesthetic complications  Airway Mallampati: II  TM Distance: >3 FB Neck ROM: full    Dental  (+) Caps, Dental Advidsory Given, Teeth Intact   Pulmonary neg pulmonary ROS, Continuous Positive Airway Pressure Ventilation    Pulmonary exam normal breath sounds clear to auscultation       Cardiovascular Exercise Tolerance: Good hypertension, (-) angina + CAD and +CHF  (-) Past MI and (-) Cardiac Stents + dysrhythmias Atrial Fibrillation + Valvular Problems/Murmurs  Rhythm:regular Rate:Normal     Neuro/Psych negative neurological ROS  negative psych ROS   GI/Hepatic Neg liver ROS,GERD  ,,  Endo/Other  negative endocrine ROS    Renal/GU CRFRenal disease  negative genitourinary   Musculoskeletal   Abdominal   Peds  Hematology negative hematology ROS (+)   Anesthesia Other Findings Past Medical History: No date: Aortic atherosclerosis (HCC) No date: Arthritis No date: Atrial fibrillation [facility])     Comment:  a.) CHA2DS2-VASc = 6 (age x2, sex, CHF, HTN, vascular               disease history) as of 06/07/2023; b.) s/p DCCV at [facility]              05/12/2020 (200 J x 1, 360 J x1) --> NSR; c.) s/p DCCV at              [facility] 05/18/2020 (150 J x 1, 200 J x1) --> NSR; d.)               cardiac rate/rhythm maintained on oral amiodarone;               chronically anticoagulated using apixaban No date: CAD (coronary artery disease) No date: CHF (congestive heart failure) (HCC)     Comment:  a.) TTE 10/27/2019: EF 55%, inf/inferolat breast               attenuation; b.) TTE 11/05/2019: EF 50-55%, no RWMAs,               G1DD,  mild MR/PR; c.) TTE 07/29/2020: EF 50-55%, mild               LVH, G1DD, mod LAE, mod MR, mild-mod TR, PASP 44 No date: CKD (chronic kidney disease) stage 3, GFR 30-59 ml/min (HCC) No date: Gastric polyps No date: GERD (gastroesophageal reflux disease) 03/30/2020: History of 2019 novel coronavirus disease (COVID-19) No date: History of bilateral cataract extraction No date: Hyperlipidemia No date: Hypertension No date: IDA (iron deficiency anemia) No date: Long term current use of amiodarone No date: On apixaban therapy 06/07/2020: Pulmonary hypertension (HCC)     Comment:  a.) CT chest 06/07/2020: dilated main PA suggesting               pHTN; b.)TTE 07/29/2020: PASP 44 mmHg No date: SNHL (sensorineural hearing loss) No date: Systolic murmur   Reproductive/Obstetrics negative OB ROS                             Anesthesia Physical Anesthesia Plan  ASA: 2  Anesthesia Plan: Spinal   Post-op Pain Management:    Induction: Intravenous  PONV Risk Score  and Plan: 2 and Propofol infusion and TIVA  Airway Management Planned: Natural Airway and Simple Face Mask  Additional Equipment:   Intra-op Plan:   Post-operative Plan:   Informed Consent: I have reviewed the patients History and Physical, chart, labs and discussed the procedure including the risks, benefits and alternatives for the proposed anesthesia with the patient or authorized representative who has indicated his/her understanding and acceptance.     Dental Advisory Given  Plan Discussed with: Anesthesiologist, CRNA and Surgeon  Anesthesia Plan Comments:         Anesthesia Quick Evaluation

## 2023-06-11 NOTE — Progress Notes (Incomplete)
Subjective: Day of Surgery Procedure(s) (LRB): COMPUTER ASSISTED TOTAL KNEE ARTHROPLASTY (Right) Patient reports pain as {pain:[id number]}.   Patient seen in rounds with Dr. Ernest Pine. Patient is well, and has had no acute complaints or problems. Denies any SOB, CP, N/V, fevers or chills. We will start therapy today.  Plan is to go Home after hospital stay.  Objective: Vital signs in last 24 hours: Temp:  [97.1 F (36.2 C)-98.5 F (36.9 C)] 97.9 F (36.6 C) (10/14 1227) Pulse Rate:  [56-82] 82 (10/14 1227) Resp:  [15-25] 19 (10/14 1227) BP: (99-159)/(44-64) 119/64 (10/14 1227) SpO2:  [90 %-100 %] 91 % (10/14 1227) Weight:  [86.6 kg] 86.6 kg (10/14 0636)  Intake/Output from previous day:  Intake/Output Summary (Last 24 hours) at 06/11/2023 1648 Last data filed at 06/11/2023 1502 Gross per 24 hour  Intake 1630 ml  Output 175 ml  Net 1455 ml    Intake/Output this shift: Total I/O In: 1630 [P.O.:180; I.V.:950; IV Piggyback:500] Out: 175 [Urine:100; Drains:25; Blood:50]  Labs: No results for input(s): "HGB" in the last 72 hours. No results for input(s): "WBC", "RBC", "HCT", "PLT" in the last 72 hours. No results for input(s): "NA", "K", "CL", "CO2", "BUN", "CREATININE", "GLUCOSE", "CALCIUM" in the last 72 hours. No results for input(s): "LABPT", "INR" in the last 72 hours.  EXAM General - Patient is Alert, Appropriate, and Oriented Extremity - Neurologically intact ABD soft Neurovascular intact Sensation intact distally Intact pulses distally Dorsiflexion/Plantar flexion intact No cellulitis present Compartment soft Dressing - dressing C/D/I Motor Function - intact, moving foot and toes well on exam. Patient able to plantar and dorsiflex with good strength and ROM. JP Drain pulled without difficulty. Intact  Past Medical History:  Diagnosis Date   Aortic atherosclerosis (HCC)    Arthritis    Atrial fibrillation (HCC)    a.) CHA2DS2-VASc = 6 (age x2, sex, CHF, HTN,  vascular disease history) as of 06/07/2023; b.) s/p [facility]V at [facility] 05/12/2020 (200 J x 1, 360 J x1) --> NSR; c.) s/p DCCV at [facility] 05/18/2020 (150 J x 1, 200 J x1) --> NSR; d.) cardiac rate/rhythm maintained on oral amiodarone; chronically anticoagulated using apixaban   CAD (coronary artery disease)    CHF (congestive heart failure) (HCC)    a.) TTE 10/27/2019: EF 55%, inf/inferolat breast attenuation; b.) TTE 11/05/2019: EF 50-55%, no RWMAs, G1DD, mild MR/PR; c.) TTE 07/29/2020: EF 50-55%, mild LVH, G1DD, mod LAE, mod MR, mild-mod TR, PASP 44   CKD (chronic kidney disease) stage 3, GFR 30-59 ml/min (HCC)    Gastric polyps    GERD (gastroesophageal reflux disease)    History of 2019 novel coronavirus disease (COVID-19) 03/30/2020   History of bilateral cataract extraction    Hyperlipidemia    Hypertension    IDA (iron deficiency anemia)    Long term current use of amiodarone    On apixaban therapy    Pulmonary hypertension (HCC) 06/07/2020   a.) CT chest 06/07/2020: dilated main PA suggesting pHTN; b.)TTE 07/29/2020: PASP 44 mmHg   SNHL (sensorineural hearing loss)    Systolic murmur     Assessment/Plan: Day of Surgery Procedure(s) (LRB): COMPUTER ASSISTED TOTAL KNEE ARTHROPLASTY (Right) Principal Problem:   Total knee replacement status  Estimated body mass index is 33.83 kg/m as calculated from the following:   Height as of this encounter: 5\' 3"  (1.6 m).   Weight as of this encounter: 86.6 kg. Advance diet Up with therapy  Patient will continue to work with physical therapy to  pass postoperative PT protocols, ROM and strengthening  Discussed with the patient continuing to utilize Polar Care  Patient will use bone foam in 20-30 minute intervals  Patient will wear TED hose bilaterally to help prevent DVT and clot formation  Discussed the Aquacel bandage.  This bandage will stay in place 7 days postoperatively.  Can be replaced with honeycomb bandages that will be sent home  with the patient  Discussed sending the patient home with tramadol and oxycodone for as needed pain management.  Patient will also be sent home with Celebrex to help with swelling and inflammation.  Patient will take an 81 mg aspirin twice daily for DVT prophylaxis  JP drain removed without difficulty, intact  Weight-Bearing as tolerated to right leg  Patient will follow-up with [facility] in 2 weeks for staple removal and reevaluation  Rayburn Go, PA-C [facility] 06/11/2023, 4:48 PM

## 2023-06-12 ENCOUNTER — Encounter: Payer: Self-pay | Admitting: Orthopedic Surgery

## 2023-06-12 DIAGNOSIS — M1711 Unilateral primary osteoarthritis, right knee: Secondary | ICD-10-CM | POA: Diagnosis not present

## 2023-06-12 MED ORDER — TRAMADOL HCL 50 MG PO TABS
50.0000 mg | ORAL_TABLET | Freq: Four times a day (QID) | ORAL | 0 refills | Status: DC | PRN
Start: 2023-06-12 — End: 2023-07-12

## 2023-06-12 MED ORDER — ACETAMINOPHEN 10 MG/ML IV SOLN
INTRAVENOUS | Status: AC
  Filled 2023-06-12: qty 100

## 2023-06-12 MED ORDER — PANTOPRAZOLE SODIUM 40 MG PO TBEC
DELAYED_RELEASE_TABLET | ORAL | Status: AC
  Filled 2023-06-12: qty 1

## 2023-06-12 MED ORDER — OXYCODONE HCL 5 MG PO TABS: 5.0000 mg | ORAL_TABLET | ORAL | 0 refills | Status: DC | PRN

## 2023-06-12 MED ORDER — AMLODIPINE BESYLATE 5 MG PO TABS
ORAL_TABLET | ORAL | Status: AC
  Filled 2023-06-12: qty 2

## 2023-06-12 MED ORDER — MAGNESIUM HYDROXIDE 400 MG/5ML PO SUSP
ORAL | Status: AC
  Filled 2023-06-12: qty 30

## 2023-06-12 MED ORDER — METOCLOPRAMIDE HCL 10 MG PO TABS
ORAL_TABLET | ORAL | Status: AC
  Filled 2023-06-12: qty 1

## 2023-06-12 MED ORDER — FERROUS SULFATE 325 (65 FE) MG PO TABS
ORAL_TABLET | ORAL | Status: AC
  Filled 2023-06-12: qty 1

## 2023-06-12 MED ORDER — SENNOSIDES-DOCUSATE SODIUM 8.6-50 MG PO TABS
ORAL_TABLET | ORAL | Status: AC
  Filled 2023-06-12: qty 1

## 2023-06-12 MED ORDER — APIXABAN 2.5 MG PO TABS
ORAL_TABLET | ORAL | Status: AC
  Filled 2023-06-12: qty 2

## 2023-06-12 MED ORDER — OXYCODONE HCL 5 MG PO TABS
ORAL_TABLET | ORAL | Status: AC
  Filled 2023-06-12: qty 1

## 2023-06-12 MED ORDER — ACETAMINOPHEN 325 MG PO TABS: 325.0000 mg | ORAL_TABLET | Freq: Four times a day (QID) | ORAL | 1 refills | Status: DC | PRN

## 2023-06-12 NOTE — Plan of Care (Signed)
  Problem: Activity: Goal: Ability to avoid complications of mobility impairment will improve Outcome: Progressing   Problem: Pain Management: Goal: Pain level will decrease with appropriate interventions Outcome: Progressing   

## 2023-06-12 NOTE — Plan of Care (Signed)
Problem: Activity: Goal: Ability to avoid complications of mobility impairment will improve Outcome: Progressing Goal: Range of joint motion will improve Outcome: Progressing

## 2023-06-12 NOTE — Progress Notes (Signed)
DISCHARGE NOTE:  Pt given discharge instructions and scripts. TED hose on both legs, 2 honeycomb dressing sent with pt.  Pt wheeled to car by staff, husband providing transportation home.

## 2023-06-12 NOTE — Progress Notes (Signed)
Physical Therapy Treatment Patient Details Name: Amanda Frazier MRN: [id number] DOB: [birth year] Today's Date: 06/12/2023   History of Present Illness Pt is s/p R TKA on 10/14.  Pt has PMH of: arthritis, Afib, CAD, CHF, CKD, and HTN.    PT Comments  Pt ready.  OOB with ease.  She is able to walk 300' with RW and cga/supervision with no LOB today.  Up/down stairs with bilateral rails and verbal review of curb steps with RW.  Pt does not have stairs at home.  Demo and review of HEP.  3-95 degrees ROM with ease, bed rail limiting further flexion.  Pt with no further questions or concerns.  Encouraged +1 of husband initially with gait at home for safety but overall does well.   If plan is discharge home, recommend the following: A little help with walking and/or transfers;A little help with bathing/dressing/bathroom;Assist for transportation;Help with stairs or ramp for entrance   Can travel by private vehicle        Equipment Recommendations  None recommended by PT    Recommendations for Other Services       Precautions / Restrictions Precautions Precautions: Knee Precaution Booklet Issued: Yes (comment) Restrictions Weight Bearing Restrictions: Yes RLE Weight Bearing: Weight bearing as tolerated     Mobility  Bed Mobility   Bed Mobility: Supine to Sit     Supine to sit: Supervision          Transfers Overall transfer level: Needs assistance Equipment used: Rolling walker (2 wheels) Transfers: Sit to/from Stand Sit to Stand: Supervision                Ambulation/Gait Ambulation/Gait assistance: Supervision, Contact guard assist Gait Distance (Feet): 300 Feet Assistive device: Rolling walker (2 wheels) Gait Pattern/deviations: WFL(Within Functional Limits)           Stairs Stairs: Yes Stairs assistance: Contact guard assist, Supervision Stair Management: Two rails, Step to pattern, Forwards Number of Stairs: 4     Wheelchair Mobility      Tilt Bed    Modified Rankin (Stroke Patients Only)       Balance Overall balance assessment: Needs assistance Sitting-balance support: No upper extremity supported, Feet supported Sitting balance-Leahy Scale: Good     Standing balance support: Bilateral upper extremity supported, During functional activity, Reliant on assistive device for balance Standing balance-Leahy Scale: Fair                              Cognition Arousal: Alert Behavior During Therapy: WFL for tasks assessed/performed                                            Exercises Total Joint Exercises Goniometric ROM: 3-95 limited by bed rail Other Exercises Other Exercises: HEP demo and review    General Comments        Pertinent Vitals/Pain Pain Assessment Pain Assessment: 0-10 Pain Score: 3  Pain Location: knee Pain Descriptors / Indicators: Sore Pain Intervention(s): Limited activity within patient's tolerance, Monitored during session, Repositioned, Premedicated before session, Ice applied    Home Living                          Prior Function            PT Goals (  current goals can now be found in the care plan section) Progress towards PT goals: Progressing toward goals    Frequency    BID      PT Plan      Co-evaluation              AM-PAC PT "6 Clicks" Mobility   Outcome Measure  Help needed turning from your back to your side while in a flat bed without using bedrails?: None Help needed moving from lying on your back to sitting on the side of a flat bed without using bedrails?: None Help needed moving to and from a bed to a chair (including a wheelchair)?: None Help needed standing up from a chair using your arms (e.g., wheelchair or bedside chair)?: None Help needed to walk in hospital room?: A Little Help needed climbing 3-5 steps with a railing? : A Little 6 Click Score: 22    End of Session Equipment Utilized During  Treatment: Gait belt Activity Tolerance: Patient tolerated treatment well Patient left: in chair;with call bell/phone within reach;with nursing/sitter in room Nurse Communication: Mobility status PT Visit Diagnosis: Unsteadiness on feet (R26.81);Other abnormalities of gait and mobility (R26.89);Muscle weakness (generalized) (M62.81);Difficulty in walking, not elsewhere classified (R26.2);Pain Pain - Right/Left: Right Pain - part of body: Knee     Time: 1610-9604 PT Time Calculation (min) (ACUTE ONLY): 17 min  Charges:    $Gait Training: 8-22 mins PT General Charges $$ ACUTE PT VISIT: 1 Visit                   Danielle Dess, PTA 06/12/23, 9:56 AM

## 2023-06-12 NOTE — TOC Progression Note (Signed)
Transition of Care [facility]) - Progression Note    Patient Details  Name: Amanda Frazier MRN: [id number] Date of Birth: [birth year]  Transition of [facility]) CM/SW Contact  Marlowe Sax, RN Phone Number: 06/12/2023, 8:46 AM  Clinical Narrative:     The patient is set up with [facility] for [facility] prior to surgery by surgeons office She lives at home with her spouse She has DME at home including Grab bars - tub/shower;Shower seat - built in;Hand held shower head;Cane - single Librarian, academic (2 wheels);Standard Walker;Rollator (4 wheels)   Expected Discharge Plan: Home w Home Health Services Barriers to Discharge: No Barriers Identified  Expected Discharge Plan and Services   Discharge Planning Services: CM Consult   Living arrangements for the past 2 months: Single Family Home Expected Discharge Date: 06/12/23               DME Arranged: N/A DME Agency: NA       HH Arranged: PT, OT HH Agency: [facility] Date HH Agency Contacted: 06/12/23 Time HH Agency Contacted: [phone] Representative spoke with at [facility] Agency: [place]   Social Determinants of Health (SDOH) Interventions SDOH Screenings   Food Insecurity: No Food Insecurity (06/11/2023)  Housing: Patient Declined (06/11/2023)  Transportation Needs: No Transportation Needs (06/11/2023)  Utilities: Not At Risk (06/11/2023)  Financial Resource Strain: Low Risk  (05/21/2020)   Received from [facility], [facility]  Social Connections: Unknown (01/10/2022)   Received from [facility], [facility]  Stress: No Stress Concern Present (05/21/2020)   Received from [facility], [facility]  Tobacco Use: Low Risk  (06/11/2023)    Readmission Risk Interventions     No data to display

## 2023-06-12 NOTE — Anesthesia Postprocedure Evaluation (Signed)
Anesthesia Post Note  Patient: Amanda Frazier  Procedure(s) Performed: COMPUTER ASSISTED TOTAL KNEE ARTHROPLASTY (Right: Knee)  Patient location during evaluation: Nursing Unit Anesthesia Type: Spinal Level of consciousness: oriented and awake and alert Pain management: pain level controlled Vital Signs Assessment: post-procedure vital signs reviewed and stable Respiratory status: spontaneous breathing and respiratory function stable Cardiovascular status: blood pressure returned to baseline and stable Postop Assessment: no headache, no backache, no apparent nausea or vomiting and patient able to bend at knees Anesthetic complications: no   No notable events documented.   Last Vitals:  Vitals:   06/12/23 0442 06/12/23 0753  BP: (!) 137/52 137/83  Pulse: 70 (!) 105  Resp: 16 17  Temp: (!) 36.4 C 36.8 C  SpO2: 94% 95%    Last Pain:  Vitals:   06/12/23 0753  TempSrc: Temporal  PainSc:                  Karoline Caldwell

## 2023-06-12 NOTE — Evaluation (Signed)
Occupational Therapy Evaluation Patient Details Name: Amanda Frazier MRN: [id number] DOB: 1946/05/14 Today's Date: 06/12/2023   History of Present Illness Pt is s/p R TKA on 10/14.  Pt has PMH of: arthritis, Afib, CAD, CHF, CKD, and HTN.   Clinical Impression   Amanda Frazier was seen for OT evaluation this date. Prior to hospital admission, pt was IND. Pt lives with spouse. Pt currently requires  SBA don underwear and pants, mild balance deficts in standing. Pt instructed in polar care mgt, falls prevention strategies, home/routines modifications, DME/AE for LB bathing/dressing tasks, and compression stocking mgt.  All education complete, will sign off.  Upon hospital discharge, recommend no OT follow up.    If plan is discharge home, recommend the following: A little help with bathing/dressing/bathroom;Help with stairs or ramp for entrance    Functional Status Assessment  Patient has had a recent decline in their functional status and demonstrates the ability to make significant improvements in function in a reasonable and predictable amount of time.  Equipment Recommendations  None recommended by OT    Recommendations for Other Services       Precautions / Restrictions Precautions Precautions: Knee Precaution Booklet Issued: Yes (comment) Restrictions Weight Bearing Restrictions: Yes RLE Weight Bearing: Weight bearing as tolerated      Mobility Bed Mobility               General bed mobility comments: not tested    Transfers Overall transfer level: Needs assistance Equipment used: None Transfers: Sit to/from Stand Sit to Stand: Supervision                  Balance Overall balance assessment: Needs assistance Sitting-balance support: No upper extremity supported, Feet supported Sitting balance-Leahy Scale: Good     Standing balance support: No upper extremity supported, During functional activity Standing balance-Leahy Scale: Fair                              ADL either performed or assessed with clinical judgement   ADL Overall ADL's : Needs assistance/impaired                                       General ADL Comments: SBA don underwear and pants, mild balance deficts in standing.                  Pertinent Vitals/Pain Pain Assessment Pain Assessment: No/denies pain     Extremity/Trunk Assessment Upper Extremity Assessment Upper Extremity Assessment: Overall WFL for tasks assessed   Lower Extremity Assessment Lower Extremity Assessment: Generalized weakness       Communication Communication Communication: No apparent difficulties   Cognition Arousal: Alert Behavior During Therapy: WFL for tasks assessed/performed Overall Cognitive Status: Within Functional Limits for tasks assessed                                                  Home Living Family/patient expects to be discharged to:: Private residence Living Arrangements: Spouse/significant other Available Help at Discharge: Family;Available 24 hours/day Type of Home: House Home Access: Ramped entrance     Home Layout: Two level;Able to live on main level with bedroom/bathroom Alternate Level Stairs-Number of Steps: 15  Bathroom Shower/Tub: Producer, television/film/video: Handicapped height     Home Equipment: Grab bars - tub/shower;Shower seat - built in;Hand held shower head;Cane - single Librarian, academic (2 wheels);Standard Walker;Rollator (4 wheels)          Prior Functioning/Environment Prior Level of Function : Independent/Modified Independent                        OT Problem List: Decreased range of motion         OT Goals(Current goals can be found in the care plan section) Acute Rehab OT Goals Patient Stated Goal: go home OT Goal Formulation: With patient Time For Goal Achievement: 06/26/23 Potential to Achieve Goals: Good   AM-PAC OT "6 Clicks" Daily Activity      Outcome Measure Help from another person eating meals?: None Help from another person taking care of personal grooming?: None Help from another person toileting, which includes using toliet, bedpan, or urinal?: A Little Help from another person bathing (including washing, rinsing, drying)?: A Little Help from another person to put on and taking off regular upper body clothing?: None Help from another person to put on and taking off regular lower body clothing?: A Little 6 Click Score: 21   End of Session    Activity Tolerance: Patient tolerated treatment well Patient left: in chair;with call bell/phone within reach;with family/visitor present  OT Visit Diagnosis: Unsteadiness on feet (R26.81)                Time: 4098-1191 OT Time Calculation (min): 18 min Charges:  OT General Charges $OT Visit: 1 Visit OT Evaluation $OT Eval Low Complexity: 1 Low  Kathie Dike, M.S. OTR/L  06/12/23, 10:33 AM  ascom [phone]

## 2023-06-12 NOTE — Care Management Obs Status (Signed)
MEDICARE OBSERVATION STATUS NOTIFICATION   Patient Details  Name: Amanda Frazier MRN: [id number] Date of Birth: July 24, 1946   Medicare Observation Status Notification Given:  Yes    Marlowe Sax, RN 06/12/2023, 10:43 AM

## 2023-06-13 ENCOUNTER — Encounter (HOSPITAL_COMMUNITY): Payer: Self-pay

## 2023-06-13 ENCOUNTER — Other Ambulatory Visit: Payer: Self-pay

## 2023-06-13 ENCOUNTER — Emergency Department (HOSPITAL_COMMUNITY): Payer: Medicare HMO

## 2023-06-13 ENCOUNTER — Observation Stay (HOSPITAL_COMMUNITY)
Admission: EM | Admit: 2023-06-13 | Discharge: 2023-06-14 | Disposition: A | Discharge status: Home / Self Care | Payer: Medicare HMO | Attending: Internal Medicine | Admitting: Internal Medicine

## 2023-06-13 DIAGNOSIS — Z7901 Long term (current) use of anticoagulants: Secondary | ICD-10-CM | POA: Insufficient documentation

## 2023-06-13 DIAGNOSIS — Z79899 Other long term (current) drug therapy: Secondary | ICD-10-CM | POA: Insufficient documentation

## 2023-06-13 DIAGNOSIS — D500 Iron deficiency anemia secondary to blood loss (chronic): Secondary | ICD-10-CM | POA: Insufficient documentation

## 2023-06-13 DIAGNOSIS — I130 Hypertensive heart and chronic kidney disease with heart failure and stage 1 through stage 4 chronic kidney disease, or unspecified chronic kidney disease: Secondary | ICD-10-CM | POA: Diagnosis not present

## 2023-06-13 DIAGNOSIS — E669 Obesity, unspecified: Secondary | ICD-10-CM | POA: Insufficient documentation

## 2023-06-13 DIAGNOSIS — I2510 Atherosclerotic heart disease of native coronary artery without angina pectoris: Secondary | ICD-10-CM | POA: Diagnosis not present

## 2023-06-13 DIAGNOSIS — I5030 Unspecified diastolic (congestive) heart failure: Secondary | ICD-10-CM | POA: Insufficient documentation

## 2023-06-13 DIAGNOSIS — N1832 Chronic kidney disease, stage 3b: Secondary | ICD-10-CM | POA: Diagnosis not present

## 2023-06-13 DIAGNOSIS — Z96653 Presence of artificial knee joint, bilateral: Secondary | ICD-10-CM | POA: Insufficient documentation

## 2023-06-13 DIAGNOSIS — I4891 Unspecified atrial fibrillation: Secondary | ICD-10-CM | POA: Diagnosis not present

## 2023-06-13 DIAGNOSIS — I272 Pulmonary hypertension, unspecified: Secondary | ICD-10-CM

## 2023-06-13 DIAGNOSIS — R000 Tachycardia, unspecified: Secondary | ICD-10-CM | POA: Diagnosis present

## 2023-06-13 LAB — CBC
HCT: 30.1 % — ABNORMAL LOW (ref 36.0–46.0)
Hemoglobin: 9.7 g/dL — ABNORMAL LOW (ref 12.0–15.0)
MCH: 29 pg (ref 26.0–34.0)
MCHC: 32.2 g/dL (ref 30.0–36.0)
MCV: 89.9 fL (ref 80.0–100.0)
Platelets: 171 10*3/uL (ref 150–400)
RBC: 3.35 MIL/uL — ABNORMAL LOW (ref 3.87–5.11)
RDW: 14.4 % (ref 11.5–15.5)
WBC: 13 10*3/uL — ABNORMAL HIGH (ref 4.0–10.5)
nRBC: 0 % (ref 0.0–0.2)

## 2023-06-13 LAB — I-STAT CHEM 8, ED
BUN: 22 mg/dL (ref 8–23)
Calcium, Ion: 1.24 mmol/L (ref 1.15–1.40)
Chloride: 100 mmol/L (ref 98–111)
Creatinine, Ser: 1.3 mg/dL — ABNORMAL HIGH (ref 0.44–1.00)
Glucose, Bld: 126 mg/dL — ABNORMAL HIGH (ref 70–99)
HCT: 30 % — ABNORMAL LOW (ref 36.0–46.0)
Hemoglobin: 10.2 g/dL — ABNORMAL LOW (ref 12.0–15.0)
Potassium: 4.5 mmol/L (ref 3.5–5.1)
Sodium: 135 mmol/L (ref 135–145)
TCO2: 23 mmol/L (ref 22–32)

## 2023-06-13 LAB — BASIC METABOLIC PANEL
Anion gap: 11 (ref 5–15)
BUN: 23 mg/dL (ref 8–23)
CO2: 24 mmol/L (ref 22–32)
Calcium: 9.2 mg/dL (ref 8.9–10.3)
Chloride: 99 mmol/L (ref 98–111)
Creatinine, Ser: 1.35 mg/dL — ABNORMAL HIGH (ref 0.44–1.00)
GFR, Estimated: 40 mL/min — ABNORMAL LOW (ref >60)
Glucose, Bld: 132 mg/dL — ABNORMAL HIGH (ref 70–99)
Potassium: 4.5 mmol/L (ref 3.5–5.1)
Sodium: 134 mmol/L — ABNORMAL LOW (ref 135–145)

## 2023-06-13 LAB — BRAIN NATRIURETIC PEPTIDE: B Natriuretic Peptide: 395.6 pg/mL — ABNORMAL HIGH (ref 0.0–100.0)

## 2023-06-13 MED ORDER — DILTIAZEM HCL-DEXTROSE 125-5 MG/125ML-% IV SOLN (PREMIX)
5.0000 mg/h | INTRAVENOUS | Status: DC
  Administered 2023-06-13: 5 mg/h via INTRAVENOUS
  Administered 2023-06-14: 12.5 mg/h via INTRAVENOUS
  Filled 2023-06-13 (×2): qty 125

## 2023-06-13 MED ORDER — DILTIAZEM HCL 25 MG/5ML IV SOLN
10.0000 mg | Freq: Once | INTRAVENOUS | Status: AC
  Administered 2023-06-13: 10 mg via INTRAVENOUS
  Filled 2023-06-13: qty 5

## 2023-06-13 MED ORDER — IOHEXOL 350 MG/ML SOLN
75.0000 mL | Freq: Once | INTRAVENOUS | Status: AC | PRN
  Administered 2023-06-13: 75 mL via INTRAVENOUS

## 2023-06-13 NOTE — ED Triage Notes (Signed)
Pt arrives via POV. Pt reports she had surgery on her right knee this past Monday. Reports her vital signs were checked today and her HR was in the 140s. Hx of Afib. PT denies cp or sob. She did stop taking her eliquis for 3 days prior to the surgery. PT AxOx4.

## 2023-06-13 NOTE — ED Provider Notes (Signed)
[place] Provider Note   CSN: [id number] Arrival date & time: 06/13/23  1640     History  Chief Complaint  Patient presents with   Atrial Fibrillation    Amanda Frazier is a 77 y.o. female history of A-fib with RVR on Eliquis, recent knee replacement 2 days ago, CAD, CHF, hypertension, anemia, pulmonary hypertension presented for A-fib RVR.  Patient states that she started her Eliquis yesterday day after her knee replacement surgery and denies any chest pain or shortness of breath.  Patient went to clinic today to have her knee examined which did not show any signs of infection however patient's heart was noted to be in the 140s and in A-fib.  Patient that she has not been in A-fib in quite some time.  Patient has any fevers, nausea vomiting, purulent drainage from the knee, redness or swelling of the knee or painful knee and states that today the nurse she saw at the Ortho clinic said it was healing well without signs of infection.  Patient denies history of blood clots.  Home Medications Prior to Admission medications   Medication Sig Start Date End Date Taking? Authorizing Provider  acetaminophen (TYLENOL) 325 MG tablet Take 1-2 tablets (325-650 mg total) by mouth every 6 (six) hours as needed for mild pain (pain score 1-3) (pain score 1-3 or temp > 100.5). 06/12/23  Yes Rayburn Go, PA-C  amiodarone (PACERONE) 100 MG tablet Take 1 tablet (100 mg total) by mouth every other day. 01/17/23  Yes Yates Decamp, MD  amLODipine (NORVASC) 5 MG tablet Take 2 tablets (10 mg total) by mouth daily. 03/13/23  Yes Yates Decamp, MD  B Complex Vitamins (B-COMPLEX/B-12) LIQD Place 1 mL under the tongue daily.   Yes [provider]  calcium carbonate (TUMS - DOSED IN MG ELEMENTAL CALCIUM) 500 MG chewable tablet Chew 1 tablet by mouth 2 (two) times daily.   Yes [provider]  Cholecalciferol (VITAMIN D3) 125 MCG (5000 UT) CAPS Take 5,000  Units by mouth daily.   Yes [provider]  ELIQUIS 5 MG TABS tablet TAKE 1 TABLET BY MOUTH TWICE A DAY 04/17/23  Yes Yates Decamp, MD  famotidine (PEPCID) 20 MG tablet Take 20 mg by mouth 2 (two) times daily.   Yes [provider]  losartan (COZAAR) 50 MG tablet Take 50 mg by mouth at bedtime. 12/27/22  Yes [provider]  oxyCODONE (OXY IR/ROXICODONE) 5 MG immediate release tablet Take 1 tablet (5 mg total) by mouth every 4 (four) hours as needed for moderate pain (pain score 4-6) (pain score 4-6). 06/12/23  Yes Rayburn Go, PA-C  rosuvastatin (CRESTOR) 20 MG tablet Take 1 tablet (20 mg total) by mouth daily. Patient taking differently: Take 20 mg by mouth at bedtime. 03/13/23  Yes Yates Decamp, MD  traMADol (ULTRAM) 50 MG tablet Take 1 tablet (50 mg total) by mouth every 6 (six) hours as needed. 06/12/23 06/11/24 Yes Rayburn Go, PA-C      Allergies    Celecoxib, Fosamax [alendronate sodium], Lipitor [atorvastatin calcium], Valsartan-hydrochlorothiazide, Benazepril, Nickel, Palladium chloride, Simvastatin, and Valsartan    Review of Systems   Review of Systems  Physical Exam Updated Vital Signs BP (!) 126/57 (BP Location: Right Arm)   Pulse (!) 127   Temp 98.8 F (37.1 C)   Resp (!) 22   Ht 5\' 3"  (1.6 m)   Wt 86.6 kg   SpO2 92%   BMI 33.82  kg/m  Physical Exam Vitals reviewed.  Constitutional:      General: She is not in acute distress. HENT:     Head: Normocephalic and atraumatic.  Eyes:     Extraocular Movements: Extraocular movements intact.     Conjunctiva/sclera: Conjunctivae normal.     Pupils: Pupils are equal, round, and reactive to light.  Cardiovascular:     Rate and Rhythm: Tachycardia present. Rhythm irregular.     Pulses: Normal pulses.     Heart sounds: Normal heart sounds.     Comments: 2+ bilateral radial/dorsalis pedis pulses with increased irregular rate Pulmonary:     Effort: Pulmonary effort is normal. No respiratory  distress.     Breath sounds: Normal breath sounds.  Abdominal:     Palpations: Abdomen is soft.     Tenderness: There is no abdominal tenderness. There is no guarding or rebound.  Musculoskeletal:     Cervical back: Normal range of motion and neck supple.     Comments: Right knee: No signs of redness, warmth, fluctuance, or drainage; appears to be healing well  Skin:    General: Skin is warm and dry.     Capillary Refill: Capillary refill takes less than 2 seconds.  Neurological:     General: No focal deficit present.     Mental Status: She is alert and oriented to person, place, and time.     Comments: Sensation intact in all 4 limbs  Psychiatric:        Mood and Affect: Mood normal.     ED Results / Procedures / Treatments   Labs (all labs ordered are listed, but only abnormal results are displayed) Labs Reviewed  BASIC METABOLIC PANEL - Abnormal; Notable for the following components:      Result Value   Sodium 134 (*)    Glucose, Bld 132 (*)    Creatinine, Ser 1.35 (*)    GFR, Estimated 40 (*)    All other components within normal limits  CBC - Abnormal; Notable for the following components:   WBC 13.0 (*)    RBC 3.35 (*)    Hemoglobin 9.7 (*)    HCT 30.1 (*)    All other components within normal limits  BRAIN NATRIURETIC PEPTIDE - Abnormal; Notable for the following components:   B Natriuretic Peptide 395.6 (*)    All other components within normal limits  I-STAT CHEM 8, ED - Abnormal; Notable for the following components:   Creatinine, Ser 1.30 (*)    Glucose, Bld 126 (*)    Hemoglobin 10.2 (*)    HCT 30.0 (*)    All other components within normal limits    EKG EKG Interpretation Date/Time:  Wednesday June 13 2023 16:55:10 EDT Ventricular Rate:  136 PR Interval:    QRS Duration:  88 QT Interval:  306 QTC Calculation: 460 R Axis:   -30  Text Interpretation: Atrial fibrillation with rapid ventricular response Left axis deviation Nonspecific ST and T  wave abnormality likely rate related Abnormal ECG When compared with ECG of 05-Jun-2023 08:32, atrial fibrillation has replaced sinus rhyhtm Confirmed by Alvino Blood ([id number]) on 06/13/2023 7:02:37 PM  Radiology CT Angio Chest PE W/Cm &/Or Wo Cm  Result Date: 06/13/2023 CLINICAL DATA:  Recent surgery, AFib with RVR. Tachycardia. Stopped Eliquis due to surgery. EXAM: CT ANGIOGRAPHY CHEST WITH CONTRAST TECHNIQUE: Multidetector CT imaging of the chest was performed using the standard protocol during bolus administration of intravenous contrast. Multiplanar CT image reconstructions and MIPs  were obtained to evaluate the vascular anatomy. RADIATION DOSE REDUCTION: This exam was performed according to the departmental dose-optimization program which includes automated exposure control, adjustment of the mA and/or kV according to patient size and/or use of iterative reconstruction technique. CONTRAST:  75mL OMNIPAQUE IOHEXOL 350 MG/ML SOLN COMPARISON:  Chest radiograph earlier today and CT chest 06/07/2020 FINDINGS: Cardiovascular: Negative for acute pulmonary embolism. Cardiomegaly. No pericardial effusion. Coronary artery and aortic atherosclerotic calcification. Dilated main pulmonary artery which can be seen with pulmonary hypertension measuring 38 mm in Mediastinum/Nodes: Trachea esophagus are unremarkable thoracic adenopathy. Lungs/Pleura: Bibasilar atelectasis. No pleural effusion pneumothorax. Upper Abdomen: No acute abnormality. Musculoskeletal: No acute fracture. Review of the MIP images confirms the above findings. IMPRESSION: 1. Negative for acute pulmonary embolism. 2. No acute abnormality in chest. 3. Dilated main pulmonary which can be seen pulmonary hypertension Aortic Atherosclerosis (ICD10-I70.0). Electronically Signed   By: Minerva Fester M.D.   On: 06/13/2023 22:18   DG Chest Port 1 View  Result Date: 06/13/2023 CLINICAL DATA:  Surgery on right knee this past Monday. Heart rate and  140s. History of AFib. Stopped Eliquis 3 days prior to surgery. EXAM: PORTABLE CHEST 1 VIEW COMPARISON:  Radiograph 04/28/2020 and CT chest 06/07/2020 FINDINGS: Low lung volumes accentuate cardiomediastinal silhouette and pulmonary vascularity. Aortic atherosclerotic calcification. Bibasilar atelectasis. Otherwise no focal consolidation. No pleural effusion or pneumothorax. IMPRESSION: Low lung volumes and basilar atelectasis. Electronically Signed   By: Minerva Fester M.D.   On: 06/13/2023 20:42    Procedures .Critical Care  Performed by: Netta Corrigan, PA-C Authorized by: Netta Corrigan, PA-C   Critical care provider statement:    Critical care time (minutes):  50   Critical care time was exclusive of:  Separately billable procedures and treating other patients   Critical care was necessary to treat or prevent imminent or life-threatening deterioration of the following conditions: A-fib with RVR requiring infusions.   Critical care was time spent personally by me on the following activities:  Blood draw for specimens, development of treatment plan with patient or surrogate, discussions with consultants, evaluation of patient's response to treatment, examination of patient, obtaining history from patient or surrogate, review of old charts, re-evaluation of patient's condition, pulse oximetry, ordering and review of radiographic studies, ordering and review of laboratory studies and ordering and performing treatments and interventions   I assumed direction of critical care for this patient from another provider in my specialty: no     Care discussed with: admitting provider       Medications Ordered in ED Medications  diltiazem (CARDIZEM) 125 mg in dextrose 5% 125 mL (1 mg/mL) infusion (has no administration in time range)  iohexol (OMNIPAQUE) 350 MG/ML injection 75 mL (75 mLs Intravenous Contrast Given 06/13/23 1902)  diltiazem (CARDIZEM) injection 10 mg (10 mg Intravenous Given 06/13/23  2017)    ED Course/ Medical Decision Making/ A&P                                 Medical Decision Making Amount and/or Complexity of Data Reviewed Labs: ordered. Radiology: ordered.  Risk Prescription drug management. Decision regarding hospitalization.   [name] 77 y.o. presented today for A-fib RVR. Working DDx that I considered at this time includes, but not limited to, PE, septic arthritis, heart failure exacerbation, pneumonia, A-fib RVR, pulmonary hypertension.  R/o DDx: PE, septic arthritis, heart failure exacerbation, pneumonia: These are considered  less likely due to history of present illness, physical exam, labs/imaging findings  Review of prior external notes: 06/13/2023 Ortho clinic  Unique Tests and My Interpretation:  CBC: Unremarkable FAO:[id number] I-STAT Chem-8:Unremarkable EXB:[id number] Chest x-ray:Unremarkable CTA chest: Pulmonary hypertension EKG: A-fib 136 bpm, no ST elevations or depressions noted  Discussion with Independent Historian:  Husband  Discussion of Management of Tests:  Margo Aye, MD Hospitalist  Risk: High: hospitalization or escalation of hospital-level care  Risk Stratification Score: none  Staffed with [place], MD  Plan: On exam patient was no acute distress was noted to be in A-fib RVR with a rate in the 140s when I evaluated her.  Patient's exam outside of the A-fib with RVR was unremarkable.  Patient did have recent knee surgery and was off Eliquis for it and so concerned that patient may have PE causing the A-fib with RVR and will obtain basic labs and scan for PE.  If workup is negative may use diltiazem to help bring patient out of her A-fib with RVR.  Patient's labs are reassuring.  With my interpretation of the CTA I do not see a massive PE and so we will give 10 mg of diltiazem to help patient with A-fib RVR and will monitor.  Anticipate admission due to A-fib with RVR post surgery.  CT scan shows pulmonary  hypertension however no other acute pathology.  Patient does have history of this and so this is not a new finding.  Patient was given the 10 mg of diltiazem however still continues to be in A-fib in the 130s and so placement patient was placed on diltiazem drip and will be admitted to hospitalist for A-fib with RVR.  This could be due to the stress of recent surgery however it is unclear of the etiology of this at this time.  I spoke to the hospitalist and patient was accepted for admission.  Patient stable for admission at this time.  Patient was given return precautions. Patient stable for discharge at this time.  Patient verbalized understanding of plan.         Final Clinical Impression(s) / ED Diagnoses Final diagnoses:  Atrial fibrillation with RVR (HCC)  Pulmonary hypertension [facility])    Rx / DC Orders ED Discharge Orders     None         Remi Deter 06/13/23 2239    Lonell Grandchild, MD 06/13/23 2320

## 2023-06-14 ENCOUNTER — Inpatient Hospital Stay (HOSPITAL_COMMUNITY): Payer: Medicare HMO

## 2023-06-14 DIAGNOSIS — I4891 Unspecified atrial fibrillation: Secondary | ICD-10-CM

## 2023-06-14 LAB — ECHOCARDIOGRAM COMPLETE
Height: 63 in
S' Lateral: 3.1 cm
Weight: 3054.69 [oz_av]

## 2023-06-14 MED ORDER — ROSUVASTATIN CALCIUM 20 MG PO TABS: 20.0000 mg | ORAL_TABLET | Freq: Every day | ORAL | Status: DC

## 2023-06-14 MED ORDER — OXYCODONE HCL 5 MG PO TABS: 5.0000 mg | ORAL_TABLET | ORAL | Status: DC | PRN

## 2023-06-14 MED ORDER — FAMOTIDINE 20 MG PO TABS
20.0000 mg | ORAL_TABLET | Freq: Every day | ORAL | Status: DC
  Administered 2023-06-14: 20 mg via ORAL
  Filled 2023-06-14: qty 1

## 2023-06-14 MED ORDER — PERFLUTREN LIPID MICROSPHERE
1.0000 mL | INTRAVENOUS | Status: AC | PRN
  Administered 2023-06-14: 4 mL via INTRAVENOUS

## 2023-06-14 MED ORDER — APIXABAN 5 MG PO TABS
5.0000 mg | ORAL_TABLET | Freq: Two times a day (BID) | ORAL | Status: DC
  Administered 2023-06-14: 5 mg via ORAL
  Filled 2023-06-14: qty 1

## 2023-06-14 MED ORDER — POLYETHYLENE GLYCOL 3350 17 G PO PACK: 17.0000 g | PACK | Freq: Every day | ORAL | Status: DC | PRN

## 2023-06-14 MED ORDER — ACETAMINOPHEN 325 MG PO TABS: 650.0000 mg | ORAL_TABLET | Freq: Four times a day (QID) | ORAL | Status: DC | PRN

## 2023-06-14 MED ORDER — AMIODARONE HCL 200 MG PO TABS: 100.0000 mg | ORAL_TABLET | ORAL | Status: DC

## 2023-06-14 MED ORDER — PROCHLORPERAZINE EDISYLATE 10 MG/2ML IJ SOLN: 5.0000 mg | Freq: Four times a day (QID) | INTRAMUSCULAR | Status: DC | PRN

## 2023-06-14 MED ORDER — MELATONIN 5 MG PO TABS: 5.0000 mg | ORAL_TABLET | Freq: Every evening | ORAL | Status: DC | PRN

## 2023-06-14 MED ORDER — TRAMADOL HCL 50 MG PO TABS
50.0000 mg | ORAL_TABLET | Freq: Four times a day (QID) | ORAL | Status: DC | PRN
  Administered 2023-06-14: 50 mg via ORAL
  Filled 2023-06-14: qty 1

## 2023-06-14 MED ORDER — AMIODARONE HCL 100 MG PO TABS: 100.0000 mg | ORAL_TABLET | Freq: Every day | ORAL | 0 refills | Status: DC

## 2023-06-14 MED ORDER — AMIODARONE HCL 200 MG PO TABS
100.0000 mg | ORAL_TABLET | Freq: Every day | ORAL | Status: DC
  Administered 2023-06-14: 100 mg via ORAL
  Filled 2023-06-14: qty 1

## 2023-06-14 NOTE — ED Notes (Signed)
Dilt gtt temporarily paused during echo study with HR back up to 140's Afib. Med immediately restarted once echo done. Hospitalist at bedside.

## 2023-06-14 NOTE — ED Notes (Signed)
Assisted pt to use the bedpan.

## 2023-06-14 NOTE — Discharge Summary (Signed)
at bedtime.   oxyCODONE 5 MG immediate release tablet Commonly known as: Oxy IR/ROXICODONE Take 1 tablet (5 mg total) by mouth every 4 (four) hours as needed for moderate pain (pain score 4-6) (pain score 4-6).   rosuvastatin 20 MG tablet Commonly known as: CRESTOR Take 1 tablet (20 mg total) by mouth daily. What changed: when to take this   traMADol 50 MG tablet Commonly  known as: Ultram Take 1 tablet (50 mg total) by mouth every 6 (six) hours as needed.   Vitamin D3 125 MCG (5000 UT) Caps Take 5,000 Units by mouth daily.        Allergies  Allergen Reactions   Celecoxib Nausea And Vomiting   Fosamax [Alendronate Sodium] Nausea And Vomiting   Lipitor [Atorvastatin Calcium]     Fatigue   Valsartan-Hydrochlorothiazide     Other reaction(s): cough   Benazepril     Other reaction(s): Cough   Nickel     Other reaction(s): Other (See Comments) Positive patch test   Palladium Chloride     Other reaction(s): Other (See Comments) Positive patch test   Simvastatin Diarrhea    Other reaction(s): diarrhea   Valsartan     Other reaction(s): Cough    Consultations: None  Procedures/Studies: ECHOCARDIOGRAM COMPLETE  Result Date: 06/14/2023    ECHOCARDIOGRAM REPORT   Patient Name:   Amanda Frazier Date of Exam: 06/14/2023 Medical Rec #:  [id number]         Height:       63.0 in Accession #:    [id number]        Weight:       190.9 lb Date of Birth:  1946/03/07        BSA:          1.896 m Patient Age:    76 years          BP:           105/67 mmHg Patient Gender: F                 HR:           120 bpm. Exam Location:  Inpatient Procedure: 2D Echo, Color Doppler, Cardiac Doppler and Intracardiac            Opacification Agent Indications:    Atrial Fibrillaion I48.91  History:        Patient has prior history of Echocardiogram examinations, most                 recent 08/17/2020. CHF, CAD, Pulmonary HTN, Arrythmias:Atrial                 Fibrillation; Risk Factors:Dyslipidemia.  Sonographer:    Harriette Bouillon RDCS Referring Phys: [id number] CAROLE N HALL IMPRESSIONS  1. Left ventricular ejection fraction, by estimation, is 70 to 75%. The left ventricle has hyperdynamic function. Left ventricular endocardial border not optimally defined to evaluate regional wall motion. There is mild concentric left ventricular hypertrophy. Left ventricular diastolic parameters  are indeterminate.  2. Right ventricular systolic function was not well visualized. The right ventricular size is normal.  3. The mitral valve is normal in structure. Trivial mitral valve regurgitation. No evidence of mitral stenosis.  4. The aortic valve was not well visualized. There is mild calcification of the aortic valve. Aortic valve regurgitation is not visualized. No aortic stenosis is present.  5. The inferior vena cava is normal in size with greater than 50% respiratory variability, suggesting right  at bedtime.   oxyCODONE 5 MG immediate release tablet Commonly known as: Oxy IR/ROXICODONE Take 1 tablet (5 mg total) by mouth every 4 (four) hours as needed for moderate pain (pain score 4-6) (pain score 4-6).   rosuvastatin 20 MG tablet Commonly known as: CRESTOR Take 1 tablet (20 mg total) by mouth daily. What changed: when to take this   traMADol 50 MG tablet Commonly  known as: Ultram Take 1 tablet (50 mg total) by mouth every 6 (six) hours as needed.   Vitamin D3 125 MCG (5000 UT) Caps Take 5,000 Units by mouth daily.        Allergies  Allergen Reactions   Celecoxib Nausea And Vomiting   Fosamax [Alendronate Sodium] Nausea And Vomiting   Lipitor [Atorvastatin Calcium]     Fatigue   Valsartan-Hydrochlorothiazide     Other reaction(s): cough   Benazepril     Other reaction(s): Cough   Nickel     Other reaction(s): Other (See Comments) Positive patch test   Palladium Chloride     Other reaction(s): Other (See Comments) Positive patch test   Simvastatin Diarrhea    Other reaction(s): diarrhea   Valsartan     Other reaction(s): Cough    Consultations: None  Procedures/Studies: ECHOCARDIOGRAM COMPLETE  Result Date: 06/14/2023    ECHOCARDIOGRAM REPORT   Patient Name:   Amanda Frazier Date of Exam: 06/14/2023 Medical Rec #:  [id number]         Height:       63.0 in Accession #:    [id number]        Weight:       190.9 lb Date of Birth:  1946/03/07        BSA:          1.896 m Patient Age:    76 years          BP:           105/67 mmHg Patient Gender: F                 HR:           120 bpm. Exam Location:  Inpatient Procedure: 2D Echo, Color Doppler, Cardiac Doppler and Intracardiac            Opacification Agent Indications:    Atrial Fibrillaion I48.91  History:        Patient has prior history of Echocardiogram examinations, most                 recent 08/17/2020. CHF, CAD, Pulmonary HTN, Arrythmias:Atrial                 Fibrillation; Risk Factors:Dyslipidemia.  Sonographer:    Harriette Bouillon RDCS Referring Phys: [id number] CAROLE N HALL IMPRESSIONS  1. Left ventricular ejection fraction, by estimation, is 70 to 75%. The left ventricle has hyperdynamic function. Left ventricular endocardial border not optimally defined to evaluate regional wall motion. There is mild concentric left ventricular hypertrophy. Left ventricular diastolic parameters  are indeterminate.  2. Right ventricular systolic function was not well visualized. The right ventricular size is normal.  3. The mitral valve is normal in structure. Trivial mitral valve regurgitation. No evidence of mitral stenosis.  4. The aortic valve was not well visualized. There is mild calcification of the aortic valve. Aortic valve regurgitation is not visualized. No aortic stenosis is present.  5. The inferior vena cava is normal in size with greater than 50% respiratory variability, suggesting right  at bedtime.   oxyCODONE 5 MG immediate release tablet Commonly known as: Oxy IR/ROXICODONE Take 1 tablet (5 mg total) by mouth every 4 (four) hours as needed for moderate pain (pain score 4-6) (pain score 4-6).   rosuvastatin 20 MG tablet Commonly known as: CRESTOR Take 1 tablet (20 mg total) by mouth daily. What changed: when to take this   traMADol 50 MG tablet Commonly  known as: Ultram Take 1 tablet (50 mg total) by mouth every 6 (six) hours as needed.   Vitamin D3 125 MCG (5000 UT) Caps Take 5,000 Units by mouth daily.        Allergies  Allergen Reactions   Celecoxib Nausea And Vomiting   Fosamax [Alendronate Sodium] Nausea And Vomiting   Lipitor [Atorvastatin Calcium]     Fatigue   Valsartan-Hydrochlorothiazide     Other reaction(s): cough   Benazepril     Other reaction(s): Cough   Nickel     Other reaction(s): Other (See Comments) Positive patch test   Palladium Chloride     Other reaction(s): Other (See Comments) Positive patch test   Simvastatin Diarrhea    Other reaction(s): diarrhea   Valsartan     Other reaction(s): Cough    Consultations: None  Procedures/Studies: ECHOCARDIOGRAM COMPLETE  Result Date: 06/14/2023    ECHOCARDIOGRAM REPORT   Patient Name:   Amanda Frazier Date of Exam: 06/14/2023 Medical Rec #:  [id number]         Height:       63.0 in Accession #:    [id number]        Weight:       190.9 lb Date of Birth:  1946/03/07        BSA:          1.896 m Patient Age:    76 years          BP:           105/67 mmHg Patient Gender: F                 HR:           120 bpm. Exam Location:  Inpatient Procedure: 2D Echo, Color Doppler, Cardiac Doppler and Intracardiac            Opacification Agent Indications:    Atrial Fibrillaion I48.91  History:        Patient has prior history of Echocardiogram examinations, most                 recent 08/17/2020. CHF, CAD, Pulmonary HTN, Arrythmias:Atrial                 Fibrillation; Risk Factors:Dyslipidemia.  Sonographer:    Harriette Bouillon RDCS Referring Phys: [id number] CAROLE N HALL IMPRESSIONS  1. Left ventricular ejection fraction, by estimation, is 70 to 75%. The left ventricle has hyperdynamic function. Left ventricular endocardial border not optimally defined to evaluate regional wall motion. There is mild concentric left ventricular hypertrophy. Left ventricular diastolic parameters  are indeterminate.  2. Right ventricular systolic function was not well visualized. The right ventricular size is normal.  3. The mitral valve is normal in structure. Trivial mitral valve regurgitation. No evidence of mitral stenosis.  4. The aortic valve was not well visualized. There is mild calcification of the aortic valve. Aortic valve regurgitation is not visualized. No aortic stenosis is present.  5. The inferior vena cava is normal in size with greater than 50% respiratory variability, suggesting right  Physician Discharge Summary  Amanda Frazier EAV:[id number] DOB: 1946/01/09 DOA: 06/13/2023  PCP: Mila Palmer, MD  Admit date: 06/13/2023 Discharge date: 06/14/2023  Admitted From: Home Disposition: Home  Recommendations for Outpatient Follow-up:  Follow up with PCP in 1-2 weeks Follow-up with cardiology as scheduled  Home Health: None Equipment/Devices: None  Discharge Condition: Stable CODE STATUS: Full Diet recommendation: Low-salt low-fat low-carb diet  Brief/Interim Summary: Amanda Frazier is a 77 y.o. female with medical history significant for permanent atrial fibrillation on Eliquis, CKD 3B, recent right knee surgery stop Eliquis on 06/07/2023 and restarted on 06/12/2023 who presents with tachycardia noted by her physical therapist during the session at home.   Patient admitted with asymptomatic A-fib with RVR after painful session with physical therapy after recent knee replacement.  Patient initially placed on diltiazem drip, patient's amiodarone was adjusted, previously 100 mg every other day, increased to 100 mg daily with moderate improvement in heart rate.  Recommend close follow-up with Dr. Jacinto Halim her primary cardiologist in the next 1 to 2 weeks given previous amiodarone was decreased due to symptomatic bradycardia.  We discussed at length if patient has worsening bradycardia or symptoms to hold amiodarone and transition back to every other day dosing until she can get in contact with Dr. Verl Dicker office.  Patient otherwise feels well back to baseline and is otherwise agreeable for discharge home, husband at bedside concurs.    Discharge Diagnoses:  Principal Problem:   Atrial fibrillation with rapid ventricular response [facility] Active Problems:   Atrial fibrillation with RVR [facility])    Discharge Instructions  Discharge Instructions     Discharge patient   Complete by: As directed    Discharge disposition: 06-Home-Health Care Svc   Discharge patient date:  06/14/2023      Allergies as of 06/14/2023       Reactions   Celecoxib Nausea And Vomiting   Fosamax [alendronate Sodium] Nausea And Vomiting   Lipitor [atorvastatin Calcium]    Fatigue   Valsartan-hydrochlorothiazide    Other reaction(s): cough   Benazepril    Other reaction(s): Cough   Nickel    Other reaction(s): Other (See Comments) Positive patch test   Palladium Chloride    Other reaction(s): Other (See Comments) Positive patch test   Simvastatin Diarrhea   Other reaction(s): diarrhea   Valsartan    Other reaction(s): Cough        Medication List     TAKE these medications    acetaminophen 325 MG tablet Commonly known as: TYLENOL Take 1-2 tablets (325-650 mg total) by mouth every 6 (six) hours as needed for mild pain (pain score 1-3) (pain score 1-3 or temp > 100.5).   amiodarone 100 MG tablet Commonly known as: PACERONE Take 1 tablet (100 mg total) by mouth daily. Start taking on: June 15, 2023 What changed: when to take this   amLODipine 5 MG tablet Commonly known as: NORVASC Take 2 tablets (10 mg total) by mouth daily.   B-Complex/B-12 Liqd Place 1 mL under the tongue daily.   calcium carbonate 500 MG chewable tablet Commonly known as: TUMS - dosed in mg elemental calcium Chew 1 tablet by mouth 2 (two) times daily.   Eliquis 5 MG Tabs tablet Generic drug: apixaban TAKE 1 TABLET BY MOUTH TWICE A DAY   famotidine 20 MG tablet Commonly known as: PEPCID Take 20 mg by mouth 2 (two) times daily.   losartan 50 MG tablet Commonly known as: COZAAR Take 50 mg by mouth  at bedtime.   oxyCODONE 5 MG immediate release tablet Commonly known as: Oxy IR/ROXICODONE Take 1 tablet (5 mg total) by mouth every 4 (four) hours as needed for moderate pain (pain score 4-6) (pain score 4-6).   rosuvastatin 20 MG tablet Commonly known as: CRESTOR Take 1 tablet (20 mg total) by mouth daily. What changed: when to take this   traMADol 50 MG tablet Commonly  known as: Ultram Take 1 tablet (50 mg total) by mouth every 6 (six) hours as needed.   Vitamin D3 125 MCG (5000 UT) Caps Take 5,000 Units by mouth daily.        Allergies  Allergen Reactions   Celecoxib Nausea And Vomiting   Fosamax [Alendronate Sodium] Nausea And Vomiting   Lipitor [Atorvastatin Calcium]     Fatigue   Valsartan-Hydrochlorothiazide     Other reaction(s): cough   Benazepril     Other reaction(s): Cough   Nickel     Other reaction(s): Other (See Comments) Positive patch test   Palladium Chloride     Other reaction(s): Other (See Comments) Positive patch test   Simvastatin Diarrhea    Other reaction(s): diarrhea   Valsartan     Other reaction(s): Cough    Consultations: None  Procedures/Studies: ECHOCARDIOGRAM COMPLETE  Result Date: 06/14/2023    ECHOCARDIOGRAM REPORT   Patient Name:   Amanda Frazier Date of Exam: 06/14/2023 Medical Rec #:  [id number]         Height:       63.0 in Accession #:    [id number]        Weight:       190.9 lb Date of Birth:  1946/03/07        BSA:          1.896 m Patient Age:    76 years          BP:           105/67 mmHg Patient Gender: F                 HR:           120 bpm. Exam Location:  Inpatient Procedure: 2D Echo, Color Doppler, Cardiac Doppler and Intracardiac            Opacification Agent Indications:    Atrial Fibrillaion I48.91  History:        Patient has prior history of Echocardiogram examinations, most                 recent 08/17/2020. CHF, CAD, Pulmonary HTN, Arrythmias:Atrial                 Fibrillation; Risk Factors:Dyslipidemia.  Sonographer:    Harriette Bouillon RDCS Referring Phys: [id number] CAROLE N HALL IMPRESSIONS  1. Left ventricular ejection fraction, by estimation, is 70 to 75%. The left ventricle has hyperdynamic function. Left ventricular endocardial border not optimally defined to evaluate regional wall motion. There is mild concentric left ventricular hypertrophy. Left ventricular diastolic parameters  are indeterminate.  2. Right ventricular systolic function was not well visualized. The right ventricular size is normal.  3. The mitral valve is normal in structure. Trivial mitral valve regurgitation. No evidence of mitral stenosis.  4. The aortic valve was not well visualized. There is mild calcification of the aortic valve. Aortic valve regurgitation is not visualized. No aortic stenosis is present.  5. The inferior vena cava is normal in size with greater than 50% respiratory variability, suggesting right

## 2023-06-14 NOTE — H&P (Signed)
History and Physical  Amanda Frazier ZOX:[id number] DOB: 07-Jan-1946 DOA: 06/13/2023  Referring physician: Salome Spotted  PCP: Mila Palmer, MD  Outpatient Specialists: Cardiology, orthopedic surgery. Patient coming from: Home  Chief Complaint:  Fast heart beat.  HPI: Amanda Frazier is a 77 y.o. female with medical history significant for permanent atrial fibrillation on Eliquis, CKD 3B, recent right knee surgery stop Eliquis on 06/07/2023 and restarted on 06/12/2023 who presents with tachycardia noted by her physical therapist during the session at home.  The patient denies having any palpitation, shortness of breath or chest pain.  Admits to compliance with her cardiac medications.  Denies any subjective fevers or chills.  She was advised by her providers to come to the ED for further evaluation.  In the ED, the patient was noted to be in A-fib with RVR.  She received a dose of 10 mg of IV diltiazem and was started on Cardizem drip.  TRH, hospitalist service, was asked to admit.  ED Course: Temperature 99.2.  BP 116/57, pulse 49, respiratory rate 24.  O2 saturation 93% on room air.  Review of Systems: Review of systems as noted in the HPI. All other systems reviewed and are negative.   Past Medical History:  Diagnosis Date   Aortic atherosclerosis (HCC)    Arthritis    Atrial fibrillation (HCC)    a.) CHA2DS2-VASc = 6 (age x2, sex, CHF, HTN, vascular disease history) as of 06/07/2023; b.) s/p DCCV at [facility] 05/12/2020 (200 J x 1, 360 J x1) --> NSR; c.) s/p DCCV at [facility] 05/18/2020 (150 J x 1, 200 J x1) --> NSR; d.) cardiac rate/rhythm maintained on oral amiodarone; chronically anticoagulated using apixaban   CAD (coronary artery disease)    CHF (congestive heart failure) (HCC)    a.) TTE 10/27/2019: EF 55%, inf/inferolat breast attenuation; b.) TTE 11/05/2019: EF 50-55%, no RWMAs, G1DD, mild MR/PR; c.) TTE 07/29/2020: EF 50-55%, mild LVH, G1DD, mod LAE, mod MR, mild-mod TR,  PASP 44   CKD (chronic kidney disease) stage 3, GFR 30-59 ml/min (HCC)    Gastric polyps    GERD (gastroesophageal reflux disease)    History of 2019 novel coronavirus disease (COVID-19) 03/30/2020   History of bilateral cataract extraction    Hyperlipidemia    Hypertension    IDA (iron deficiency anemia)    Long term current use of amiodarone    On apixaban therapy    Pulmonary hypertension (HCC) 06/07/2020   a.) CT chest 06/07/2020: dilated main PA suggesting pHTN; b.)TTE 07/29/2020: PASP 44 mmHg   SNHL (sensorineural hearing loss)    Systolic murmur    Past Surgical History:  Procedure Laterality Date   BIOPSY  07/12/2021   Procedure: BIOPSY;  Surgeon: Charlott Rakes, MD;  Location: [facility];  Service: Endoscopy;;   CARDIOVERSION N/A 05/12/2020   Procedure: CARDIOVERSION; Location: [facility]; Surgeon: Vilinda Boehringer, MD   CARDIOVERSION N/A 05/18/2020   Procedure: CARDIOVERSION; Location: [facility]; Surgeon: Wille Glaser, MD   CATARACT EXTRACTION, BILATERAL     COLONOSCOPY WITH PROPOFOL N/A 07/12/2021   Procedure: COLONOSCOPY WITH PROPOFOL;  Surgeon: Charlott Rakes, MD;  Location: [facility];  Service: Endoscopy;  Laterality: N/A;   ESOPHAGOGASTRODUODENOSCOPY (EGD) WITH PROPOFOL N/A 07/12/2021   Procedure: ESOPHAGOGASTRODUODENOSCOPY (EGD) WITH PROPOFOL;  Surgeon: Charlott Rakes, MD;  Location: [facility];  Service: Endoscopy;  Laterality: N/A;   KNEE ARTHROPLASTY Right 06/11/2023   Procedure: COMPUTER ASSISTED TOTAL KNEE ARTHROPLASTY;  Surgeon: Donato Heinz, MD;  Location: [facility];  Service: Orthopedics;  Laterality: Right;   POLYPECTOMY  07/12/2021   Procedure: POLYPECTOMY;  Surgeon: Charlott Rakes, MD;  Location: [facility];  Service: Endoscopy;;   REPLACEMENT TOTAL KNEE Left 2015   SCLEROTHERAPY  07/12/2021   Procedure: SCLEROTHERAPY;  Surgeon: Charlott Rakes, MD;  Location: [facility];  Service: Endoscopy;;   SUBMUCOSAL TATTOO  INJECTION  07/12/2021   Procedure: SUBMUCOSAL TATTOO INJECTION;  Surgeon: Charlott Rakes, MD;  Location: [facility];  Service: Endoscopy;;   TUBAL LIGATION      Social History:  reports that she has never smoked. She has never used smokeless tobacco. She reports that she does not drink alcohol and does not use drugs.   Allergies  Allergen Reactions   Celecoxib Nausea And Vomiting   Fosamax [Alendronate Sodium] Nausea And Vomiting   Lipitor [Atorvastatin Calcium]     Fatigue   Valsartan-Hydrochlorothiazide     Other reaction(s): cough   Benazepril     Other reaction(s): Cough   Nickel     Other reaction(s): Other (See Comments) Positive patch test   Palladium Chloride     Other reaction(s): Other (See Comments) Positive patch test   Simvastatin Diarrhea    Other reaction(s): diarrhea   Valsartan     Other reaction(s): Cough    Family History  Problem Relation Age of Onset   Breast cancer Maternal Aunt    Atrial fibrillation Mother    Hyperlipidemia Mother    Lung cancer Mother    Atrial fibrillation Father    Colon cancer Father    Breast cancer Sister    Leukemia Brother       Prior to Admission medications   Medication Sig Start Date End Date Taking? Authorizing Provider  acetaminophen (TYLENOL) 325 MG tablet Take 1-2 tablets (325-650 mg total) by mouth every 6 (six) hours as needed for mild pain (pain score 1-3) (pain score 1-3 or temp > 100.5). 06/12/23  Yes Rayburn Go, PA-C  amiodarone (PACERONE) 100 MG tablet Take 1 tablet (100 mg total) by mouth every other day. 01/17/23  Yes Yates Decamp, MD  amLODipine (NORVASC) 5 MG tablet Take 2 tablets (10 mg total) by mouth daily. 03/13/23  Yes Yates Decamp, MD  B Complex Vitamins (B-COMPLEX/B-12) LIQD Place 1 mL under the tongue daily.   Yes [provider]  calcium carbonate (TUMS - DOSED IN MG ELEMENTAL CALCIUM) 500 MG chewable tablet Chew 1 tablet by mouth 2 (two) times daily.   Yes [provider]  Cholecalciferol (VITAMIN D3) 125 MCG (5000 UT) CAPS Take 5,000 Units by mouth daily.   Yes [provider]  ELIQUIS 5 MG TABS tablet TAKE 1 TABLET BY MOUTH TWICE A DAY 04/17/23  Yes Yates Decamp, MD  famotidine (PEPCID) 20 MG tablet Take 20 mg by mouth 2 (two) times daily.   Yes [provider]  losartan (COZAAR) 50 MG tablet Take 50 mg by mouth at bedtime. 12/27/22  Yes [provider]  oxyCODONE (OXY IR/ROXICODONE) 5 MG immediate release tablet Take 1 tablet (5 mg total) by mouth every 4 (four) hours as needed for moderate pain (pain score 4-6) (pain score 4-6). 06/12/23  Yes Rayburn Go, PA-C  rosuvastatin (CRESTOR) 20 MG tablet Take 1 tablet (20 mg total) by mouth daily. Patient taking differently: Take 20 mg by mouth at bedtime. 03/13/23  Yes Yates Decamp, MD  traMADol (ULTRAM) 50 MG tablet Take 1 tablet (50 mg total) by mouth every 6 (six) hours as needed. 06/12/23 06/11/24  Yes Rayburn Go, PA-C    Physical Exam: BP (!) 116/57   Pulse (!) 49   Temp 99.2 F (37.3 C) (Oral)   Resp (!) 24   Ht 5\' 3"  (1.6 m)   Wt 86.6 kg   SpO2 93%   BMI 33.82 kg/m   General: 77 y.o. year-old female well developed well nourished in no acute distress.  Alert and oriented x3. Cardiovascular: Irregular rate and rhythm with no rubs or gallops.  No thyromegaly or JVD noted.  Trace lower extremity edema bilaterally.  Compression stockings in place. Respiratory: Clear to auscultation with no wheezes or rales. Good inspiratory effort. Abdomen: Soft nontender nondistended with normal bowel sounds x4 quadrants. Muskuloskeletal: No cyanosis or clubbing noted bilaterally Neuro: CN II-XII intact, strength, sensation, reflexes Skin: No ulcerative lesions noted or rashes Psychiatry: Judgement and insight appear normal. Mood is appropriate for condition and setting          Labs on Admission:  Basic Metabolic Panel: Recent Labs  Lab 06/13/23 1651 06/13/23 1746  NA 134* 135   K 4.5 4.5  CL 99 100  CO2 24  --   GLUCOSE 132* 126*  BUN 23 22  CREATININE 1.35* 1.30*  CALCIUM 9.2  --    Liver Function Tests: No results for input(s): "AST", "ALT", "ALKPHOS", "BILITOT", "PROT", "ALBUMIN" in the last 168 hours. No results for input(s): "LIPASE", "AMYLASE" in the last 168 hours. No results for input(s): "AMMONIA" in the last 168 hours. CBC: Recent Labs  Lab 06/13/23 1651 06/13/23 1746  WBC 13.0*  --   HGB 9.7* 10.2*  HCT 30.1* 30.0*  MCV 89.9  --   PLT 171  --    Cardiac Enzymes: No results for input(s): "CKTOTAL", "CKMB", "CKMBINDEX", "TROPONINI" in the last 168 hours.  BNP (last 3 results) Recent Labs    06/13/23 1651  BNP 395.6*    ProBNP (last 3 results) No results for input(s): "PROBNP" in the last 8760 hours.  CBG: No results for input(s): "GLUCAP" in the last 168 hours.  Radiological Exams on Admission: CT Angio Chest PE W/Cm &/Or Wo Cm  Result Date: 06/13/2023 CLINICAL DATA:  Recent surgery, AFib with RVR. Tachycardia. Stopped Eliquis due to surgery. EXAM: CT ANGIOGRAPHY CHEST WITH CONTRAST TECHNIQUE: Multidetector CT imaging of the chest was performed using the standard protocol during bolus administration of intravenous contrast. Multiplanar CT image reconstructions and MIPs were obtained to evaluate the vascular anatomy. RADIATION DOSE REDUCTION: This exam was performed according to the departmental dose-optimization program which includes automated exposure control, adjustment of the mA and/or kV according to patient size and/or use of iterative reconstruction technique. CONTRAST:  75mL OMNIPAQUE IOHEXOL 350 MG/ML SOLN COMPARISON:  Chest radiograph earlier today and CT chest 06/07/2020 FINDINGS: Cardiovascular: Negative for acute pulmonary embolism. Cardiomegaly. No pericardial effusion. Coronary artery and aortic atherosclerotic calcification. Dilated main pulmonary artery which can be seen with pulmonary hypertension measuring 38 mm in  Mediastinum/Nodes: Trachea esophagus are unremarkable thoracic adenopathy. Lungs/Pleura: Bibasilar atelectasis. No pleural effusion pneumothorax. Upper Abdomen: No acute abnormality. Musculoskeletal: No acute fracture. Review of the MIP images confirms the above findings. IMPRESSION: 1. Negative for acute pulmonary embolism. 2. No acute abnormality in chest. 3. Dilated main pulmonary which can be seen pulmonary hypertension Aortic Atherosclerosis (ICD10-I70.0). Electronically Signed   By: Minerva Fester M.D.   On: 06/13/2023 22:18   DG Chest Port 1 View  Result Date: 06/13/2023 CLINICAL DATA:  Surgery on right knee this past Monday. Heart  rate and 140s. History of AFib. Stopped Eliquis 3 days prior to surgery. EXAM: PORTABLE CHEST 1 VIEW COMPARISON:  Radiograph 04/28/2020 and CT chest 06/07/2020 FINDINGS: Low lung volumes accentuate cardiomediastinal silhouette and pulmonary vascularity. Aortic atherosclerotic calcification. Bibasilar atelectasis. Otherwise no focal consolidation. No pleural effusion or pneumothorax. IMPRESSION: Low lung volumes and basilar atelectasis. Electronically Signed   By: Minerva Fester M.D.   On: 06/13/2023 20:42    EKG: I independently viewed the EKG done and my findings are as followed: Atrial fibrillation with RVR 136.  QTc 460.  Assessment/Plan Present on Admission:  Atrial fibrillation with rapid ventricular response (HCC)  Principal Problem:   Atrial fibrillation with rapid ventricular response (HCC)  Permanent atrial fibrillation with RVR Admits to compliance with home cardiac medications Started on Cardizem drip in the ED, continue Resume home amiodarone Resume home Eliquis Follow 2D echo  GERD Resume home regimen  Obesity BMI 33 Recommend weight loss outpatient regular physical activity and healthy dieting.  HFpEF BNP greater than 300 Not significantly volume overload Follow 2D echo Strict I's and O's and daily weight  Time: 75 minutes  DVT  prophylaxis: Home Eliquis  Code Status: Full code  Family Communication: Family member at bedside.  Disposition Plan: Admitted to progressive care unit  Consults called: None.  Admission status: Inpatient status.   Status is: Inpatient The patient requires at least 2 midnights for further evaluation and treatment of present condition.   Darlin Drop MD [facility] Pager [phone]  If 7PM-7AM, please contact night-coverage [email or web address] Password [facility]  06/14/2023, 5:38 AM

## 2023-06-14 NOTE — Care Management Obs Status (Signed)
MEDICARE OBSERVATION STATUS NOTIFICATION   Patient Details  Name: PRESLIE DEPASQUALE MRN: [id number] Date of Birth: [birth year]   Medicare Observation Status Notification Given:  Yes    Lavenia Atlas, RN 06/14/2023, 3:40 PM

## 2023-06-14 NOTE — Evaluation (Signed)
Physical Therapy Evaluation Patient Details Name: Amanda Frazier MRN: [id number] DOB: May 05, 1946 Today's Date: 06/14/2023  History of Present Illness  Pt is a 77 y.o. female who presented 06/13/23 to ED after HHPT found pt to be tachycardic. Pt admitted with Afib with RVR. Of note, s/p R TKA 06/11/23. PMH: arthritis, Afib, CAD, HLD, pulmonary HTN, CHF, CKD, and HTN.   Clinical Impression  Pt presents with condition above and deficits mentioned below, see PT Problem List. Starting in July, pt began to use a RW for pain management with longer distance mobility but did not utilize an AD for shorter distances. After her R TKA 10/14, pt had been utilizing a RW. She resides with her husband in a 2-level house with a ramped entrance. She can stay on the main level of the house. Currently, pt is demonstrating deficits in R knee flexion/extension ROM as anticipated s/p recent TKA. She is also demonstrating deficits in balance, activity tolerance/endurance, and gross strength. Her HR varied 100s-120s at rest initially, but then jumped up to 140 briefly then back to 120s with intermittent 130s while ambulating. It went down to 80s after ambulating but varied up to 110s intermittently while resting end of session. Question potential drop in BP as pt reported feeling very fatigued after ambulating and her SBP was 100s end of session (per monitor, had been 110s-120s earlier), but she denied feeling lightheaded. She was able to complete all functional mobility with extra time, a RW for support, and CGA for safety without LOB. Preformed seated and supine R knee ROM exercises during session. Recommending pt continue with HHPT upon d/c home. Will continue to follow acutely.      If plan is discharge home, recommend the following: A little help with bathing/dressing/bathroom;Assist for transportation;Help with stairs or ramp for entrance;A little help with walking and/or transfers;Assistance with cooking/housework    Can travel by private vehicle        Equipment Recommendations None recommended by PT  Recommendations for Other Services       Functional Status Assessment Patient has had a recent decline in their functional status and demonstrates the ability to make significant improvements in function in a reasonable and predictable amount of time.     Precautions / Restrictions Precautions Precautions: Fall;Other (comment) Precaution Comments: watch BP and HR Restrictions Weight Bearing Restrictions: Yes RLE Weight Bearing: Weight bearing as tolerated      Mobility  Bed Mobility Overal bed mobility: Needs Assistance Bed Mobility: Supine to Sit, Sit to Supine     Supine to sit: Contact guard, HOB elevated Sit to supine: Contact guard assist, HOB elevated   General bed mobility comments: Extra time due to pain managing R leg onto/off of bed, CGA for safety    Transfers Overall transfer level: Needs assistance Equipment used: Rolling walker (2 wheels) Transfers: Sit to/from Stand Sit to Stand: Contact guard assist           General transfer comment: Pt able to transfer to stand from edge of stretcher with extra time and mild instability but no LOB, CGA for safety    Ambulation/Gait Ambulation/Gait assistance: Contact guard assist Gait Distance (Feet): 70 Feet Assistive device: Rolling walker (2 wheels) Gait Pattern/deviations: Step-through pattern, Decreased stride length, Antalgic, Trunk flexed Gait velocity: reduced Gait velocity interpretation: <1.31 ft/sec, indicative of household ambulator   General Gait Details: Pt ambulates slowly around room (limited by monitor without portable box to watch HR) with an antalgic gait pattern. Increased trunk flexion  and difficulty noted as distance progressed, CGA for safety  Stairs            Wheelchair Mobility     Tilt Bed    Modified Rankin (Stroke Patients Only)       Balance Overall balance assessment:  Needs assistance Sitting-balance support: No upper extremity supported, Feet supported Sitting balance-Leahy Scale: Good     Standing balance support: Bilateral upper extremity supported, During functional activity, Reliant on assistive device for balance Standing balance-Leahy Scale: Poor Standing balance comment: Reliant on RW                             Pertinent Vitals/Pain Pain Assessment Pain Assessment: Faces Faces Pain Scale: Hurts even more Pain Location: R knee Pain Descriptors / Indicators: Discomfort, Grimacing, Operative site guarding Pain Intervention(s): Monitored during session, Limited activity within patient's tolerance, Premedicated before session, Repositioned    Home Living Family/patient expects to be discharged to:: Private residence Living Arrangements: Spouse/significant other Available Help at Discharge: Family;Available 24 hours/day Type of Home: House Home Access: Ramped entrance     Alternate Level Stairs-Number of Steps: 15 Home Layout: Two level;Able to live on main level with bedroom/bathroom (2nd level is basement) Home Equipment: Wheelchair - manual;BSC/3in1;Shower seat - built in;Grab bars - tub/shower;Hand held shower head;Cane - single point;Rolling Walker (2 wheels);Rollator (4 wheels);Standard Walker      Prior Function Prior Level of Function : Independent/Modified Independent             Mobility Comments: Starting in July pt used RW for longer distances prior to surgery due to pain, had difficulty with stairs; no AD for shorter distances       Extremity/Trunk Assessment   Upper Extremity Assessment Upper Extremity Assessment: Overall WFL for tasks assessed    Lower Extremity Assessment Lower Extremity Assessment: RLE deficits/detail RLE Deficits / Details: limited R knee extension/flexion ROM due to pain s/p TKA 06/11/23, achieves ~90' knee flexion AAROM, lacks ~5-10' knee extension AROM    Cervical / Trunk  Assessment Cervical / Trunk Assessment: Normal  Communication   Communication Communication: No apparent difficulties  Cognition Arousal: Alert Behavior During Therapy: WFL for tasks assessed/performed Overall Cognitive Status: Within Functional Limits for tasks assessed                                          General Comments General comments (skin integrity, edema, etc.): encouraged frequent mobility with staff and AROM of R knee while here; HR varied 100s-120s at rest initially, up to 140 briefly then back to 120s with intermittent 130s while ambulating, down to 80s  after ambulating but varying up to 110s; SBP 100s end of session    Exercises Total Joint Exercises Ankle Circles/Pumps: AROM, Both, 10 reps, Supine Quad Sets: AROM, Right, 10 reps, Supine (with towel roll under ankle) Heel Slides: AAROM, Right, 10 reps, Supine Long Arc Quad: AROM, Right, 10 reps, Seated   Assessment/Plan    PT Assessment Patient needs continued PT services  PT Problem List Decreased strength;Decreased range of motion;Decreased activity tolerance;Decreased balance;Decreased mobility;Pain;Cardiopulmonary status limiting activity       PT Treatment Interventions DME instruction;Gait training;Stair training;Functional mobility training;Therapeutic activities;Therapeutic exercise;Balance training;Neuromuscular re-education;Patient/family education    PT Goals (Current goals can be found in the Care Plan section)  Acute Rehab PT Goals Patient  Stated Goal: to improve PT Goal Formulation: With patient/family Time For Goal Achievement: 06/28/23 Potential to Achieve Goals: Good    Frequency 7X/week     Co-evaluation               AM-PAC PT "6 Clicks" Mobility  Outcome Measure Help needed turning from your back to your side while in a flat bed without using bedrails?: A Little Help needed moving from lying on your back to sitting on the side of a flat bed without using  bedrails?: A Little Help needed moving to and from a bed to a chair (including a wheelchair)?: A Little Help needed standing up from a chair using your arms (e.g., wheelchair or bedside chair)?: A Little Help needed to walk in hospital room?: A Little Help needed climbing 3-5 steps with a railing? : A Little 6 Click Score: 18    End of Session Equipment Utilized During Treatment: Gait belt Activity Tolerance: Patient tolerated treatment well Patient left: in bed;with call bell/phone within reach;with family/visitor present   PT Visit Diagnosis: Unsteadiness on feet (R26.81);Other abnormalities of gait and mobility (R26.89);Muscle weakness (generalized) (M62.81);Difficulty in walking, not elsewhere classified (R26.2);Pain Pain - Right/Left: Right Pain - part of body: Knee    Time: 1215-1244 PT Time Calculation (min) (ACUTE ONLY): 29 min   Charges:   PT Evaluation $PT Eval Moderate Complexity: 1 Mod PT Treatments $Therapeutic Exercise: 8-22 mins PT General Charges $$ ACUTE PT VISIT: 1 Visit         Virgil Benedict, PT, DPT Acute Rehabilitation Services  Office: [phone]   Bettina Gavia 06/14/2023, 1:16 PM

## 2023-06-14 NOTE — Care Management CC44 (Signed)
Condition Code 44 Documentation Completed  Patient Details  Name: JEHAN RANGANATHAN MRN: [id number] Date of Birth: 01-10-1946   Condition Code 44 given:  Yes Patient signature on Condition Code 44 notice:  Yes Documentation of 2 MD's agreement:  Yes Code 44 added to claim:  Yes    Lavenia Atlas, RN 06/14/2023, 3:39 PM

## 2023-06-14 NOTE — ED Notes (Signed)
ED TO INPATIENT HANDOFF REPORT  ED Nurse Name and Phone #: Dahlia Client [phone]  S Name/Age/Gender Amanda Frazier 77 y.o. female Room/Bed: 027C/027C  Code Status   Code Status: Full Code  Home/SNF/Other Home Patient oriented to: self, place, time, and situation Is this baseline? Yes   Triage Complete: Triage complete  Chief Complaint Atrial fibrillation with rapid ventricular response (HCC) [I48.91]  Triage Note Pt arrives via POV. Pt reports she had surgery on her right knee this past Monday. Reports her vital signs were checked today and her HR was in the 140s. Hx of Afib. PT denies cp or sob. She did stop taking her eliquis for 3 days prior to the surgery. PT AxOx4.    Allergies Allergies  Allergen Reactions   Celecoxib Nausea And Vomiting   Fosamax [Alendronate Sodium] Nausea And Vomiting   Lipitor [Atorvastatin Calcium]     Fatigue   Valsartan-Hydrochlorothiazide     Other reaction(s): cough   Benazepril     Other reaction(s): Cough   Nickel     Other reaction(s): Other (See Comments) Positive patch test   Palladium Chloride     Other reaction(s): Other (See Comments) Positive patch test   Simvastatin Diarrhea    Other reaction(s): diarrhea   Valsartan     Other reaction(s): Cough    Level of Care/Admitting Diagnosis ED Disposition     ED Disposition  Admit   Condition  --   Comment  Hospital Area: [facility] [[id number]]  Level of Care: Progressive [102]  Admit to Progressive based on following criteria: CARDIOVASCULAR & THORACIC of moderate stability with acute coronary syndrome symptoms/low risk myocardial infarction/hypertensive urgency/arrhythmias/heart failure potentially compromising stability and stable post cardiovascular intervention patients.  May admit patient to [facility] or [facility] if equivalent level of care is available:: Yes  Covid Evaluation: Asymptomatic - no recent exposure (last 10 days) testing not required   Diagnosis: Atrial fibrillation with rapid ventricular response [facility]) [[id number]]  Admitting Physician: Darlin Drop [[id number]]  Attending Physician: Darlin Drop [[id number]]  Certification:: I certify this patient will need inpatient services for at least 2 midnights  Expected Medical Readiness: 06/15/2023          B Medical/Surgery History Past Medical History:  Diagnosis Date   Aortic atherosclerosis (HCC)    Arthritis    Atrial fibrillation (HCC)    a.) CHA2DS2-VASc = 6 (age x2, sex, CHF, HTN, vascular disease history) as of 06/07/2023; b.) s/p DCCV at [facility] 05/12/2020 (200 J x 1, 360 J x1) --> NSR; c.) s/p DCCV at [facility] 05/18/2020 (150 J x 1, 200 J x1) --> NSR; d.) cardiac rate/rhythm maintained on oral amiodarone; chronically anticoagulated using apixaban   CAD (coronary artery disease)    CHF (congestive heart failure) (HCC)    a.) TTE 10/27/2019: EF 55%, inf/inferolat breast attenuation; b.) TTE 11/05/2019: EF 50-55%, no RWMAs, G1DD, mild MR/PR; c.) TTE 07/29/2020: EF 50-55%, mild LVH, G1DD, mod LAE, mod MR, mild-mod TR, PASP 44   CKD (chronic kidney disease) stage 3, GFR 30-59 ml/min (HCC)    Gastric polyps    GERD (gastroesophageal reflux disease)    History of 2019 novel coronavirus disease (COVID-19) 03/30/2020   History of bilateral cataract extraction    Hyperlipidemia    Hypertension    IDA (iron deficiency anemia)    Long term current use of amiodarone    On apixaban therapy    Pulmonary hypertension (HCC) 06/07/2020   a.) CT  chest 06/07/2020: dilated main PA suggesting pHTN; b.)TTE 07/29/2020: PASP 44 mmHg   SNHL (sensorineural hearing loss)    Systolic murmur    Past Surgical History:  Procedure Laterality Date   BIOPSY  07/12/2021   Procedure: BIOPSY;  Surgeon: Charlott Rakes, MD;  Location: [facility];  Service: Endoscopy;;   CARDIOVERSION N/A 05/12/2020   Procedure: CARDIOVERSION; Location: [facility]; Surgeon: Vilinda Boehringer, MD   CARDIOVERSION  N/A 05/18/2020   Procedure: CARDIOVERSION; Location: [facility]; Surgeon: Wille Glaser, MD   CATARACT EXTRACTION, BILATERAL     COLONOSCOPY WITH PROPOFOL N/A 07/12/2021   Procedure: COLONOSCOPY WITH PROPOFOL;  Surgeon: Charlott Rakes, MD;  Location: [facility];  Service: Endoscopy;  Laterality: N/A;   ESOPHAGOGASTRODUODENOSCOPY (EGD) WITH PROPOFOL N/A 07/12/2021   Procedure: ESOPHAGOGASTRODUODENOSCOPY (EGD) WITH PROPOFOL;  Surgeon: Charlott Rakes, MD;  Location: [facility];  Service: Endoscopy;  Laterality: N/A;   KNEE ARTHROPLASTY Right 06/11/2023   Procedure: COMPUTER ASSISTED TOTAL KNEE ARTHROPLASTY;  Surgeon: Donato Heinz, MD;  Location: [facility];  Service: Orthopedics;  Laterality: Right;   POLYPECTOMY  07/12/2021   Procedure: POLYPECTOMY;  Surgeon: Charlott Rakes, MD;  Location: [facility];  Service: Endoscopy;;   REPLACEMENT TOTAL KNEE Left 2015   SCLEROTHERAPY  07/12/2021   Procedure: SCLEROTHERAPY;  Surgeon: Charlott Rakes, MD;  Location: [facility];  Service: Endoscopy;;   SUBMUCOSAL TATTOO INJECTION  07/12/2021   Procedure: SUBMUCOSAL TATTOO INJECTION;  Surgeon: Charlott Rakes, MD;  Location: [facility];  Service: Endoscopy;;   TUBAL LIGATION       A IV Location/Drains/Wounds Patient Lines/Drains/Airways Status     Active Line/Drains/Airways     Name Placement date Placement time Site Days   Peripheral IV 06/13/23 20 G Left Antecubital 06/13/23  1737  Antecubital  1            Intake/Output Last 24 hours  Intake/Output Summary (Last 24 hours) at 06/14/2023 0724 Last data filed at 06/14/2023 0343 Gross per 24 hour  Intake --  Output 50 ml  Net -50 ml    Labs/Imaging Results for orders placed or performed during the hospital encounter of 06/13/23 (from the past 48 hour(s))  Basic metabolic panel     Status: Abnormal   Collection Time: 06/13/23  4:51 PM  Result Value Ref Range   Sodium 134 (L) 135 - 145 mmol/L   Potassium  4.5 3.5 - 5.1 mmol/L   Chloride 99 98 - 111 mmol/L   CO2 24 22 - 32 mmol/L   Glucose, Bld 132 (H) 70 - 99 mg/dL    Comment: Glucose reference range applies only to samples taken after fasting for at least 8 hours.   BUN 23 8 - 23 mg/dL   Creatinine, Ser 1.61 (H) 0.44 - 1.00 mg/dL   Calcium 9.2 8.9 - 09.6 mg/dL   GFR, Estimated 40 (L) >60 mL/min    Comment: (NOTE) Calculated using the CKD-EPI Creatinine Equation (2021)    Anion gap 11 5 - 15    Comment: Performed at [facility], [address], [place], [place] [address]  CBC     Status: Abnormal   Collection Time: 06/13/23  4:51 PM  Result Value Ref Range   WBC 13.0 (H) 4.0 - 10.5 K/uL   RBC 3.35 (L) 3.87 - 5.11 MIL/uL   Hemoglobin 9.7 (L) 12.0 - 15.0 g/dL   HCT 98.1 (L) 19.1 - 47.8 %   MCV 89.9 80.0 - 100.0 fL   MCH 29.0 26.0 -  34.0 pg   MCHC 32.2 30.0 - 36.0 g/dL   RDW 09.6 04.5 - 40.9 %   Platelets 171 150 - 400 K/uL   nRBC 0.0 0.0 - 0.2 %    Comment: Performed at [facility], [address], [place], [place] [address]  Brain natriuretic peptide     Status: Abnormal   Collection Time: 06/13/23  4:51 PM  Result Value Ref Range   B Natriuretic Peptide 395.6 (H) 0.0 - 100.0 pg/mL    Comment: Performed at [facility], [address] [place], [place] [address]  I-stat chem 8, ED     Status: Abnormal   Collection Time: 06/13/23  5:46 PM  Result Value Ref Range   Sodium 135 135 - 145 mmol/L   Potassium 4.5 3.5 - 5.1 mmol/L   Chloride 100 98 - 111 mmol/L   BUN 22 8 - 23 mg/dL   Creatinine, Ser 5.62 (H) 0.44 - 1.00 mg/dL   Glucose, Bld 130 (H) 70 - 99 mg/dL    Comment: Glucose reference range applies only to samples taken after fasting for at least 8 hours.   Calcium, Ion 1.24 1.15 - 1.40 mmol/L   TCO2 23 22 - 32 mmol/L   Hemoglobin 10.2 (L) 12.0 - 15.0 g/dL   HCT 86.5 (L) 78.4 - 69.6 %   CT Angio Chest PE W/Cm &/Or Wo Cm  Result Date: 06/13/2023 CLINICAL DATA:  Recent surgery, AFib with RVR.  Tachycardia. Stopped Eliquis due to surgery. EXAM: CT ANGIOGRAPHY CHEST WITH CONTRAST TECHNIQUE: Multidetector CT imaging of the chest was performed using the standard protocol during bolus administration of intravenous contrast. Multiplanar CT image reconstructions and MIPs were obtained to evaluate the vascular anatomy. RADIATION DOSE REDUCTION: This exam was performed according to the departmental dose-optimization program which includes automated exposure control, adjustment of the mA and/or kV according to patient size and/or use of iterative reconstruction technique. CONTRAST:  75mL OMNIPAQUE IOHEXOL 350 MG/ML SOLN COMPARISON:  Chest radiograph earlier today and CT chest 06/07/2020 FINDINGS: Cardiovascular: Negative for acute pulmonary embolism. Cardiomegaly. No pericardial effusion. Coronary artery and aortic atherosclerotic calcification. Dilated main pulmonary artery which can be seen with pulmonary hypertension measuring 38 mm in Mediastinum/Nodes: Trachea esophagus are unremarkable thoracic adenopathy. Lungs/Pleura: Bibasilar atelectasis. No pleural effusion pneumothorax. Upper Abdomen: No acute abnormality. Musculoskeletal: No acute fracture. Review of the MIP images confirms the above findings. IMPRESSION: 1. Negative for acute pulmonary embolism. 2. No acute abnormality in chest. 3. Dilated main pulmonary which can be seen pulmonary hypertension Aortic Atherosclerosis (ICD10-I70.0). Electronically Signed   By: Minerva Fester M.D.   On: 06/13/2023 22:18   DG Chest Port 1 View  Result Date: 06/13/2023 CLINICAL DATA:  Surgery on right knee this past Monday. Heart rate and 140s. History of AFib. Stopped Eliquis 3 days prior to surgery. EXAM: PORTABLE CHEST 1 VIEW COMPARISON:  Radiograph 04/28/2020 and CT chest 06/07/2020 FINDINGS: Low lung volumes accentuate cardiomediastinal silhouette and pulmonary vascularity. Aortic atherosclerotic calcification. Bibasilar atelectasis. Otherwise no focal  consolidation. No pleural effusion or pneumothorax. IMPRESSION: Low lung volumes and basilar atelectasis. Electronically Signed   By: Minerva Fester M.D.   On: 06/13/2023 20:42    Pending Labs Unresulted Labs (From admission, onward)    None       Vitals/Pain Today's Vitals   06/14/23 0520 06/14/23 0535 06/14/23 0600 06/14/23 0710  BP:   111/70 119/68  Pulse: 87 (!) 49 69 (!) 114  Resp: (!) 23 (!)  24 15 14   Temp:      TempSrc:      SpO2: 92% 93% 95% 94%  Weight:      Height:      PainSc:        Isolation Precautions No active isolations  Medications Medications  diltiazem (CARDIZEM) 125 mg in dextrose 5% 125 mL (1 mg/mL) infusion (12.5 mg/hr Intravenous Rate/Dose Change 06/14/23 0716)  apixaban (ELIQUIS) tablet 5 mg (has no administration in time range)  famotidine (PEPCID) tablet 20 mg (has no administration in time range)  amiodarone (PACERONE) tablet 100 mg (has no administration in time range)  acetaminophen (TYLENOL) tablet 650 mg (has no administration in time range)  prochlorperazine (COMPAZINE) injection 5 mg (has no administration in time range)  polyethylene glycol (MIRALAX / GLYCOLAX) packet 17 g (has no administration in time range)  melatonin tablet 5 mg (has no administration in time range)  iohexol (OMNIPAQUE) 350 MG/ML injection 75 mL (75 mLs Intravenous Contrast Given 06/13/23 1902)  diltiazem (CARDIZEM) injection 10 mg (10 mg Intravenous Given 06/13/23 2017)    Mobility walks with device     Focused Assessments Cardiac Assessment Handoff:  Cardiac Rhythm: Atrial fibrillation No results found for: "CKTOTAL", "CKMB", "CKMBINDEX", "TROPONINI" No results found for: "DDIMER" Does the Patient currently have chest pain? No    R Recommendations: See Admitting Provider Note  Report given to:   Additional Notes: Dilt gtt

## 2023-06-17 NOTE — Plan of Care (Signed)
CHL Tonsillectomy/Adenoidectomy, Postoperative PEDS care plan entered in error.

## 2023-06-18 ENCOUNTER — Encounter: Payer: Self-pay | Admitting: Hematology

## 2023-06-20 ENCOUNTER — Ambulatory Visit: Payer: Medicare HMO | Attending: Physician Assistant | Admitting: Physician Assistant

## 2023-06-20 ENCOUNTER — Encounter: Payer: Self-pay | Admitting: Physician Assistant

## 2023-06-20 ENCOUNTER — Other Ambulatory Visit: Payer: Self-pay | Admitting: Cardiology

## 2023-06-20 VITALS — BP 130/43 | HR 78 | Ht 63.0 in | Wt 189.0 lb

## 2023-06-20 DIAGNOSIS — I480 Paroxysmal atrial fibrillation: Secondary | ICD-10-CM | POA: Diagnosis not present

## 2023-06-20 DIAGNOSIS — I10 Essential (primary) hypertension: Secondary | ICD-10-CM | POA: Diagnosis not present

## 2023-06-20 DIAGNOSIS — E7800 Pure hypercholesterolemia, unspecified: Secondary | ICD-10-CM

## 2023-06-20 DIAGNOSIS — I5032 Chronic diastolic (congestive) heart failure: Secondary | ICD-10-CM

## 2023-06-20 MED ORDER — AMIODARONE HCL 100 MG PO TABS: 200.0000 mg | ORAL_TABLET | Freq: Every day | ORAL | 0 refills | Status: DC

## 2023-06-20 MED ORDER — AMIODARONE HCL 100 MG PO TABS: 200.0000 mg | ORAL_TABLET | Freq: Every day | ORAL | Status: DC

## 2023-06-20 NOTE — Patient Instructions (Addendum)
Medication Instructions:  Your physician has recommended you make the following change in your medication:   INCREASE the Amiodarone to 100 taking 2 tablets daily   *If you need a refill on your cardiac medications before your next appointment, please call your pharmacy*   Lab Work: None ordered  If you have labs (blood work) drawn today and your tests are completely normal, you will receive your results only by: MyChart Message (if you have MyChart) OR A paper copy in the mail If you have any lab test that is abnormal or we need to change your treatment, we will call you to review the results.   Testing/Procedures: None ordered   Follow-Up: At [facility], you and your health needs are our priority.  As part of our continuing mission to provide you with exceptional heart care, we have created designated Provider Care Teams.  These Care Teams include your primary Cardiologist (physician) and Advanced Practice Providers (APPs -  Physician Assistants and Nurse Practitioners) who all work together to provide you with the care you need, when you need it.  We recommend signing up for the patient portal called "MyChart".  Sign up information is provided on this After Visit Summary.  MyChart is used to connect with patients for Virtual Visits (Telemedicine).  Patients are able to view lab/test results, encounter notes, upcoming appointments, etc.  Non-urgent messages can be sent to your provider as well.   To learn more about what you can do with MyChart, go to [email or web address].    Your next appointment:   3 week(s)  Provider:            Other Instructions Weigh yourself daily and if it's going up and you have to use Lasix, please call and let us know

## 2023-06-20 NOTE — Progress Notes (Signed)
Cardiology Office Note:    Date:  06/20/2023  ID:  Amanda Frazier, DOB [birth year], MRN [id number] PCP: Mila Palmer, MD   HeartCare Providers Cardiologist:  Yates Decamp, MD       Patient Profile:      Paroxysmal atrial fibrillation S/p DCCV 04/2020 Amiodarone therapy (HFpEF) heart failure with preserved ejection fraction TTE 07/29/2020: EF 50-55, GR 1 DD, moderate LAE, moderate MR, PASP 44 TTE 06/14/2023: EF 70-75, mild LVH, trivial MR, mild AV calcification, RAP 3 Coronary artery Ca2+ on CT (05/2023) Nuclear stress test 10/27/2019: Normal perfusion, EF 55, low risk  Hypertension Hyperlipidemia Chronic kidney disease stage III Iron deficiency anemia due to gastric polyps GERD Aortic atherosclerosis        History of Present Illness:  Discussed the use of AI scribe software for clinical note transcription with the patient, who gave verbal consent to proceed.  Amanda Frazier is a 77 y.o. female who returns for posthospitalization follow-up.  She was last seen by Dr. Jacinto Halim in May 2024.  She was recently cleared for knee replacement surgery which took place on 06/11/2023.  She was then admitted 10/16-10/17 with atrial fibrillation with rapid ventricular rate.  She had undergone recent knee replacement surgery.  She was noted to be tachycardic on follow-up and sent to the emergency room.  She was asymptomatic.  She was placed on diltiazem drip for rate control.  Her dose of amiodarone was increased from 100 mg every other day to 100 mg daily.  Echocardiogram demonstrated normal LV function.  Chest CT was negative for pulmonary embolism.  There was dilated pulmonary artery noted.  The patient was not seen by cardiology.   She is here with her husband. Since d/c from the hospital, the patient reports one instance of a pulse rate of 113 bpm during home exercises, but otherwise, the pulse rate has remained in the 80s. She denies experiencing chest pain, shortness of breath, or any  signs of fluid overload.       Review of Systems  Gastrointestinal:  Negative for hematochezia and melena.  Genitourinary:  Negative for hematuria.  See HPI    Studies Reviewed:   EKG Interpretation Date/Time:  Wednesday June 20 2023 15:51:29 EDT Ventricular Rate:  85 PR Interval:    QRS Duration:  88 QT Interval:  380 QTC Calculation: 452 R Axis:   -25  Text Interpretation: Atrial fibrillation Confirmed by Tereso Newcomer [id number]) on 06/20/2023 3:52:33 PM    Results   LABS Potassium: 4.5 (06/13/2023) Creatinine: 1.3 (06/13/2023) ALT: 16 (06/05/2023) Hemoglobin: 10.2 (06/13/2023) LDL: 110 (07/17/2022)      Risk Assessment/Calculations:    CHA2DS2-VASc Score = 4   This indicates a 4.8% annual risk of stroke. The patient's score is based upon: CHF History: 0 HTN History: 1 Diabetes History: 0 Stroke History: 0 Vascular Disease History: 0 Age Score: 2 Gender Score: 1            Physical Exam:   VS:  BP (!) 130/43   Pulse 78   Ht 5\' 3"  (1.6 m)   Wt 189 lb (85.7 kg)   SpO2 98%   BMI 33.48 kg/m    Wt Readings from Last 3 Encounters:  06/20/23 189 lb (85.7 kg)  06/13/23 190 lb 14.7 oz (86.6 kg)  06/11/23 191 lb (86.6 kg)    Constitutional:      Appearance: Healthy appearance. Not in distress.  Neck:     Vascular: No JVR.  Pulmonary:  Breath sounds: Normal breath sounds. No wheezing. No rales.  Cardiovascular:     Normal rate. Irregularly irregular rhythm. Normal S1. Normal S2.      Murmurs: There is a grade 2/6 systolic murmur at the URSB.  Edema:    Peripheral edema present.    Ankle: trace edema of the left ankle and 1+ edema of the right ankle. Abdominal:     Palpations: Abdomen is soft.        Assessment and Plan:   Assessment & Plan Paroxysmal atrial fibrillation (HCC) S/p DCCV in 2021. She has been maintained in NSR on Amiodarone. Dose was reduced in the past due to bradycardia. She had a recurrent episode post knee replacement surgery  and was admitted with AFib with RVR. She is currently tolerating AFib with no symptoms of fluid overload. Amiodarone dose was adjusted during hospitalization from 100 mg every other day to 100 mg daily. EKG confirms persistent AFib with HR in the 80s. She needs to be on Eliquis for 3 weeks w/o interruption. This was just restarted 06/12/23 after her TKR.   -Increase Amiodarone to 200 mg daily until f/u visit. Hopefully, this will control her HR and give her a better chance of achieving NSR. -Continue Eliquis 5mg  twice daily. -Plan for cardioversion after 3 weeks of uninterrupted anticoagulation. -Schedule follow-up in 2 weeks. Repeat EKG at that time and arrange DCCV if still in AFib. -Will need to reduce Amiodarone back to 100 mg daily at time of DCCV.  Chronic heart failure with preserved ejection fraction (HCC) Echo in 05/2023 with EF 70-75. Stable volume status on exam, NYHA class II. -Continue Lasix as needed for weight gain or increased swelling.  Essential hypertension Controlled.  -Continue Losartan 50mg  daily and Amlodipine 10mg  daily. Pure hypercholesterolemia No chest discomfort suggestive of angina. -Continue Crestor 20mg  daily.           Dispo:  Return in about 2 weeks (around 07/04/2023) for Close Follow Up, w/ Dr. Jacinto Halim, or Tereso Newcomer, PA-C.  Signed, Tereso Newcomer, PA-C

## 2023-06-20 NOTE — Assessment & Plan Note (Signed)
Echo in 05/2023 with EF 70-75. Stable volume status on exam, NYHA class II. -Continue Lasix as needed for weight gain or increased swelling.

## 2023-06-20 NOTE — Assessment & Plan Note (Signed)
S/p DCCV in 2021. She has been maintained in NSR on Amiodarone. Dose was reduced in the past due to bradycardia. She had a recurrent episode post knee replacement surgery and was admitted with AFib with RVR. She is currently tolerating AFib with no symptoms of fluid overload. Amiodarone dose was adjusted during hospitalization from 100 mg every other day to 100 mg daily. EKG confirms persistent AFib with HR in the 80s. She needs to be on Eliquis for 3 weeks w/o interruption. This was just restarted 06/12/23 after her TKR.   -Increase Amiodarone to 200 mg daily until f/u visit. Hopefully, this will control her HR and give her a better chance of achieving NSR. -Continue Eliquis 5mg  twice daily. -Plan for cardioversion after 3 weeks of uninterrupted anticoagulation. -Schedule follow-up in 2 weeks. Repeat EKG at that time and arrange DCCV if still in AFib. -Will need to reduce Amiodarone back to 100 mg daily at time of DCCV.

## 2023-06-20 NOTE — Assessment & Plan Note (Signed)
No chest discomfort suggestive of angina. -Continue Crestor 20mg  daily.

## 2023-06-20 NOTE — Assessment & Plan Note (Signed)
Controlled.  -Continue Losartan 50mg  daily and Amlodipine 10mg  daily.

## 2023-06-21 NOTE — Telephone Encounter (Signed)
Prescription refill request for Eliquis received. Indication: a fib Last office visit: 06/20/23 Scr: 1.3 06/13/23 Age: 77 Weight: 85kg

## 2023-07-11 ENCOUNTER — Encounter: Payer: Self-pay | Admitting: Physician Assistant

## 2023-07-11 ENCOUNTER — Ambulatory Visit: Payer: Medicare HMO | Attending: Physician Assistant | Admitting: Physician Assistant

## 2023-07-11 VITALS — BP 112/40 | HR 73 | Ht 63.0 in | Wt 188.2 lb

## 2023-07-11 DIAGNOSIS — I10 Essential (primary) hypertension: Secondary | ICD-10-CM | POA: Diagnosis not present

## 2023-07-11 DIAGNOSIS — I5032 Chronic diastolic (congestive) heart failure: Secondary | ICD-10-CM | POA: Diagnosis not present

## 2023-07-11 DIAGNOSIS — I480 Paroxysmal atrial fibrillation: Secondary | ICD-10-CM

## 2023-07-11 MED ORDER — AMIODARONE HCL 200 MG PO TABS: 100.0000 mg | ORAL_TABLET | Freq: Every day | ORAL | Status: DC

## 2023-07-11 NOTE — Assessment & Plan Note (Addendum)
Echo in 05/2023 with EF 70-75. Stable volume status on exam, NYHA class II.

## 2023-07-11 NOTE — Assessment & Plan Note (Addendum)
Controlled.  -Continue Losartan 50mg  daily and Amlodipine 10mg  daily.

## 2023-07-11 NOTE — Progress Notes (Signed)
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  Amanda Frazier is a 77 y.o. female who returns for follow-up of atrial fibrillation.  She had been admitted in October for recurrent atrial fibrillation with rapid rate.  This occurred after a recent knee replacement surgery.  Amiodarone dose was increased.  She is brought back for close follow-up with an eye towards cardioversion if she remains in atrial fibrillation.  She is here with her husband. She notes that since her last visit, her HR has been in the sixties and seventies. The patient has been attending physical therapy b/c of her recent knee replacment and reports no instances of abnormal pulse rates during these sessions. She denies any episodes of fatigue, which she typically experiences during episodes of atrial fibrillation, especially in hot weather. The patient denies any shortness of breath, chest pain, or fainting spells. She has some R leg swelling related to her surgery. The patient has been adhering to her medication regimen, including Eliquis, without missing any doses.     ROS   See  HPI     Studies Reviewed:   EKG Interpretation Date/Time:  Wednesday July 11 2023 14:55:24 EST Ventricular Rate:  73 PR Interval:    QRS Duration:  96 QT Interval:  402 QTC Calculation: 442 R Axis:   -17  Text Interpretation: Atrial fibrillation Nonspecific T wave abnormality No significant change since last tracing Confirmed by Tereso Newcomer [phone]) on 07/11/2023 3:20:26 PM             Risk Assessment/Calculations:    CHA2DS2-VASc Score = 4   This indicates a 4.8% annual risk of stroke. The patient's score is based upon: CHF History: 0 HTN History: 1 Diabetes History: 0 Stroke History: 0 Vascular Disease History: 0 Age Score: 2 Gender Score: 1            Physical Exam:   VS:  BP (!) 112/40   Pulse 73   Ht 5\' 3"  (1.6 m)   Wt 188 lb 3.2 oz (85.4 kg)   SpO2 98%   BMI 33.34 kg/m    Wt Readings from Last 3 Encounters:  07/11/23 188 lb 3.2 oz (85.4 kg)  06/20/23 189 lb (85.7 kg)  06/13/23 190 lb 14.7 oz (86.6 kg)    Constitutional:      Appearance: Healthy appearance. Not in distress.  Neck:     Vascular: JVD normal.  Pulmonary:     Breath sounds: Normal breath sounds. No wheezing. No rales.  Cardiovascular:     Normal rate. Irregularly irregular rhythm.     Murmurs: There is no murmur.  Edema:  Peripheral edema present.    Pretibial: trace edema of the left pretibial area and 1+ edema of the right pretibial area. Abdominal:     Palpations: Abdomen is soft.        Assessment and Plan:   Assessment & Plan Paroxysmal atrial fibrillation (HCC) She went back into AFib with RVR after her knee replacement last month. Her Amiodarone dose was previously reduced due to low HR. Amiodarone was increased at last visit to 200 mg once daily to maintain adequate HR and to reload her in an attempt to revert to NSR. Today, she is still in atrial fibrillation with controlled rate. She has tolerated atrial fibrillation well. However, she has not tolerated it in the  past. She has been on uninterrupted anticoagulation for > 3 weeks now. I have recommended DCCV. I discussed this with Dr. Mayford Knife (attending MD) who agreed. -Arrange DCCV -BMET, CBC -Decrease Amiodarone back to 100 mg once daily on the day of her DCCV given prior hx of bradycardia in NSR. -Continue Eliquis 5 mg twice daily.  -Follow up in AFib clinic after DCCV -Follow up with Dr. Jacinto Halim in 3 mos  Chronic heart failure with preserved ejection fraction (HCC) Echo in 05/2023 with EF 70-75. Stable volume status on exam, NYHA class II. Essential hypertension Controlled.  -Continue Losartan 50mg  daily and Amlodipine 10mg  daily.    Informed Consent   Shared Decision Making/Informed Consent The risks (stroke, cardiac arrhythmias rarely resulting in the need for a temporary or permanent pacemaker, skin irritation or burns and complications associated with conscious sedation including aspiration, arrhythmia, respiratory failure and death), benefits (restoration of normal sinus rhythm) and alternatives of a direct current cardioversion were explained in detail to Ms. [name] and she agrees to proceed.       Dispo:  Return in about 3 months (around 10/11/2023) for w/ Dr. Jacinto Halim.  Signed, Tereso Newcomer, PA-C

## 2023-07-11 NOTE — H&P (View-Only) (Signed)
 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  Amanda Frazier is a 77 y.o. female who returns for follow-up of atrial fibrillation.  She had been admitted in October for recurrent atrial fibrillation with rapid rate.  This occurred after a recent knee replacement surgery.  Amiodarone dose was increased.  She is brought back for close follow-up with an eye towards cardioversion if she remains in atrial fibrillation.  She is here with her husband. She notes that since her last visit, her HR has been in the sixties and seventies. The patient has been attending physical therapy b/c of her recent knee replacment and reports no instances of abnormal pulse rates during these sessions. She denies any episodes of fatigue, which she typically experiences during episodes of atrial fibrillation, especially in hot weather. The patient denies any shortness of breath, chest pain, or fainting spells. She has some R leg swelling related to her surgery. The patient has been adhering to her medication regimen, including Eliquis, without missing any doses.     ROS   See  HPI     Studies Reviewed:   EKG Interpretation Date/Time:  Wednesday July 11 2023 14:55:24 EST Ventricular Rate:  73 PR Interval:    QRS Duration:  96 QT Interval:  402 QTC Calculation: 442 R Axis:   -17  Text Interpretation: Atrial fibrillation Nonspecific T wave abnormality No significant change since last tracing Confirmed by Tereso Newcomer [phone]) on 07/11/2023 3:20:26 PM             Risk Assessment/Calculations:    CHA2DS2-VASc Score = 4   This indicates a 4.8% annual risk of stroke. The patient's score is based upon: CHF History: 0 HTN History: 1 Diabetes History: 0 Stroke History: 0 Vascular Disease History: 0 Age Score: 2 Gender Score: 1            Physical Exam:   VS:  BP (!) 112/40   Pulse 73   Ht 5\' 3"  (1.6 m)   Wt 188 lb 3.2 oz (85.4 kg)   SpO2 98%   BMI 33.34 kg/m    Wt Readings from Last 3 Encounters:  07/11/23 188 lb 3.2 oz (85.4 kg)  06/20/23 189 lb (85.7 kg)  06/13/23 190 lb 14.7 oz (86.6 kg)    Constitutional:      Appearance: Healthy appearance. Not in distress.  Neck:     Vascular: JVD normal.  Pulmonary:     Breath sounds: Normal breath sounds. No wheezing. No rales.  Cardiovascular:     Normal rate. Irregularly irregular rhythm.     Murmurs: There is no murmur.  Edema:  Peripheral edema present.    Pretibial: trace edema of the left pretibial area and 1+ edema of the right pretibial area. Abdominal:     Palpations: Abdomen is soft.        Assessment and Plan:   Assessment & Plan Paroxysmal atrial fibrillation (HCC) She went back into AFib with RVR after her knee replacement last month. Her Amiodarone dose was previously reduced due to low HR. Amiodarone was increased at last visit to 200 mg once daily to maintain adequate HR and to reload her in an attempt to revert to NSR. Today, she is still in atrial fibrillation with controlled rate. She has tolerated atrial fibrillation well. However, she has not tolerated it in the  past. She has been on uninterrupted anticoagulation for > 3 weeks now. I have recommended DCCV. I discussed this with Dr. Mayford Knife (attending MD) who agreed. -Arrange DCCV -BMET, CBC -Decrease Amiodarone back to 100 mg once daily on the day of her DCCV given prior hx of bradycardia in NSR. -Continue Eliquis 5 mg twice daily.  -Follow up in AFib clinic after DCCV -Follow up with Dr. Jacinto Halim in 3 mos  Chronic heart failure with preserved ejection fraction (HCC) Echo in 05/2023 with EF 70-75. Stable volume status on exam, NYHA class II. Essential hypertension Controlled.  -Continue Losartan 50mg  daily and Amlodipine 10mg  daily.    Informed Consent   Shared Decision Making/Informed Consent The risks (stroke, cardiac arrhythmias rarely resulting in the need for a temporary or permanent pacemaker, skin irritation or burns and complications associated with conscious sedation including aspiration, arrhythmia, respiratory failure and death), benefits (restoration of normal sinus rhythm) and alternatives of a direct current cardioversion were explained in detail to Ms. [name] and she agrees to proceed.       Dispo:  Return in about 3 months (around 10/11/2023) for w/ Dr. Jacinto Halim.  Signed, Tereso Newcomer, PA-C

## 2023-07-11 NOTE — Patient Instructions (Signed)
Medication Instructions:  Your physician has recommended you make the following change in your medication:   ON FRIDAY, 07/13/23, REDUCE THE AMIODARONE TO 200 MG TAKING ONLY 1/2 TABLET DAILY  *If you need a refill on your cardiac medications before your next appointment, please call your pharmacy*   Lab Work: TODAY:  BMET & CBC  If you have labs (blood work) drawn today and your tests are completely normal, you will receive your results only by: MyChart Message (if you have MyChart) OR A paper copy in the mail If you have any lab test that is abnormal or we need to change your treatment, we will call you to review the results.   Testing/Procedures: Your physician has recommended that you have a Cardioversion (DCCV). Electrical Cardioversion uses a jolt of electricity to your heart either through paddles or wired patches attached to your chest. This is a controlled, usually prescheduled, procedure. Defibrillation is done under light anesthesia in the hospital, and you usually go home the day of the procedure. This is done to get your heart back into a normal rhythm. You are not awake for the procedure. Please see the instruction sheet given to you BELOW:      Dear Amanda Frazier  You are scheduled for a Cardioversion on Friday, November 15 with Dr. Eden Emms.  Please arrive at the [facility] (Main Entrance A) at [facility]: [address] [place], [place] [address] at 8:00 AM (This time is 1 hour(s) before your procedure to ensure your preparation). Free valet parking service is available. You will check in at ADMITTING. The support person will be asked to wait in the waiting room.  It is OK to have someone drop you off and come back when you are ready to be discharged.      DIET:  Nothing to eat or drink after midnight except a sip of water with medications (see medication instructions below)  MEDICATION INSTRUCTIONS: !!IF ANY NEW MEDICATIONS ARE STARTED AFTER TODAY, PLEASE  NOTIFY YOUR PROVIDER AS SOON AS POSSIBLE!!  FYI: Medications such as Semaglutide (Ozempic, [place]), Tirzepatide (Mounjaro, Zepbound), Dulaglutide (Trulicity), etc ("GLP1 agonists") AND Canagliflozin (Invokana), Dapagliflozin (Farxiga), Empagliflozin (Jardiance), Ertugliflozin (Steglatro), Bexagliflozin [employer]) or any combination with one of these drugs such as Invokamet (Canagliflozin/Metformin), Synjardy (Empagliflozin/Metformin), etc ("SGLT2 inhibitors") must be held around the time of a procedure. This is not a comprehensive list of all of these drugs. Please review all of your medications and talk to your provider if you take any one of these. If you are not sure, ask your provider.     Continue taking your anticoagulant (blood thinner): Apixaban (Eliquis).  You will need to continue this after your procedure until you are told by your provider that it is safe to stop.    LABS:  WILL BE DONE TODAY  FYI:  For your safety, and to allow [place] to monitor your vital signs accurately during the surgery/procedure we request: If you have artificial nails, gel coating, SNS etc, please have those removed prior to your surgery/procedure. Not having the nail coverings /polish removed may result in cancellation or delay of your surgery/procedure.  You must have a responsible person to drive you home and stay in the waiting area during your procedure. Failure to do so could result in cancellation.  Bring your insurance cards.  *Special Note: Every effort is made to have your procedure done on time. Occasionally there are emergencies that occur at the hospital that may cause delays.  Please be patient if a delay does occur.       Follow-Up: At [facility], you and your health needs are our priority.  As part of our continuing mission to provide you with exceptional heart care, we have created designated Provider Care Teams.  These Care Teams include your primary Cardiologist (physician) and  Advanced Practice Providers (APPs -  Physician Assistants and Nurse Practitioners) who all work together to provide you with the care you need, when you need it.  We recommend signing up for the patient portal called "MyChart".  Sign up information is provided on this After Visit Summary.  MyChart is used to connect with patients for Virtual Visits (Telemedicine).  Patients are able to view lab/test results, encounter notes, upcoming appointments, etc.  Non-urgent messages can be sent to your provider as well.   To learn more about what you can do with MyChart, go to [email or web address].    Your next appointment:   1 WEEK IN AFIB CLINIC  3-4 MONTHS   Provider:   Yates Decamp, MD     Other Instructions

## 2023-07-11 NOTE — Assessment & Plan Note (Signed)
She went back into AFib with RVR after her knee replacement last month. Her Amiodarone dose was previously reduced due to low HR. Amiodarone was increased at last visit to 200 mg once daily to maintain adequate HR and to reload her in an attempt to revert to NSR. Today, she is still in atrial fibrillation with controlled rate. She has tolerated atrial fibrillation well. However, she has not tolerated it in the past. She has been on uninterrupted anticoagulation for > 3 weeks now. I have recommended DCCV. I discussed this with Dr. Mayford Knife (attending MD) who agreed. -Arrange DCCV -BMET, CBC -Decrease Amiodarone back to 100 mg once daily on the day of her DCCV given prior hx of bradycardia in NSR. -Continue Eliquis 5 mg twice daily.  -Follow up in AFib clinic after DCCV -Follow up with Dr. Jacinto Halim in 3 mos

## 2023-07-12 LAB — BASIC METABOLIC PANEL
BUN/Creatinine Ratio: 15 (ref 12–28)
BUN: 23 mg/dL (ref 8–27)
CO2: 22 mmol/L (ref 20–29)
Calcium: 9.7 mg/dL (ref 8.7–10.3)
Chloride: 105 mmol/L (ref 96–106)
Creatinine, Ser: 1.56 mg/dL — ABNORMAL HIGH (ref 0.57–1.00)
Glucose: 98 mg/dL (ref 70–99)
Potassium: 4.7 mmol/L (ref 3.5–5.2)
Sodium: 141 mmol/L (ref 134–144)
eGFR: 34 mL/min/{1.73_m2} — ABNORMAL LOW (ref >59)

## 2023-07-12 LAB — CBC
Hematocrit: 30.8 % — ABNORMAL LOW (ref 34.0–46.6)
Hemoglobin: 9.7 g/dL — ABNORMAL LOW (ref 11.1–15.9)
MCH: 28.6 pg (ref 26.6–33.0)
MCHC: 31.5 g/dL (ref 31.5–35.7)
MCV: 91 fL (ref 79–97)
Platelets: 234 10*3/uL (ref 150–450)
RBC: 3.39 x10E6/uL — ABNORMAL LOW (ref 3.77–5.28)
RDW: 13.1 % (ref 11.7–15.4)
WBC: 4 10*3/uL (ref 3.4–10.8)

## 2023-07-12 NOTE — Anesthesia Preprocedure Evaluation (Addendum)
Anesthesia Evaluation  Patient identified by MRN, date of birth, ID band Patient awake    Reviewed: Allergy & Precautions, H&P , NPO status , Patient's Chart, lab work & pertinent test results  History of Anesthesia Complications Negative for: history of anesthetic complications  Airway Mallampati: II  TM Distance: >3 FB Neck ROM: Full    Dental  (+) Dental Advisory Given, Teeth Intact   Pulmonary Continuous Positive Airway Pressure Ventilation , pneumonia   Pulmonary exam normal breath sounds clear to auscultation       Cardiovascular Exercise Tolerance: Good hypertension, pulmonary hypertension(-) angina + CAD and +CHF  (-) Past MI and (-) Cardiac Stents + dysrhythmias Atrial Fibrillation + Valvular Problems/Murmurs  Rhythm:Irregular Rate:Normal  Echo   1. Left ventricular ejection fraction, by estimation, is 70 to 75%. The left ventricle has hyperdynamic function. Left ventricular endocardial border not optimally defined to evaluate regional wall motion. There is mild concentric left ventricular hypertrophy. Left ventricular diastolic parameters are indeterminate.   2. Right ventricular systolic function was not well visualized. The right ventricular size is normal.   3. The mitral valve is normal in structure. Trivial mitral valve regurgitation. No evidence of mitral stenosis.   4. The aortic valve was not well visualized. There is mild calcification of the aortic valve. Aortic valve regurgitation is not visualized. No aortic stenosis is present.   5. The inferior vena cava is normal in size with greater than 50% respiratory variability, suggesting right atrial pressure of 3 mmHg.   Conclusion(s)/Recommendation(s): Technically limited astudy due to poor sound wave transmission.   Echocardiogram 07/29/2020:  Left ventricle cavity is normal in size. Mild concentric hypertrophy of  the left ventricle. Normal global wall motion.  Normal LV systolic function  with visual EF 50-55%. Doppler evidence of grade I (impaired) diastolic  dysfunction, elevated LAP.  Left atrial cavity is at least moderately dilated.  Moderate (Grade III) mitral regurgitation.  Mild to moderate tricuspid regurgitation. Estimated pulmonary artery  systolic pressure 44 mmHg.  Compared to previous study on 11/05/2019, pulmonary hypertension is new.      Echocardiogram 11/05/2019:  Normal LV systolic function with visual EF 50-55%. No obvious regional  wall motion abnormalities. Normal wall thickness. Left ventricle cavity is  normal in size. Normal global wall motion. Left atrial cavity is mildly  dilated .  Mild (Grade I) mitral regurgitation. Mild tricuspid regurgitation. Mild  pulmonic regurgitation.  No prior study for compairison.      Neuro/Psych negative neurological ROS  negative psych ROS   GI/Hepatic Neg liver ROS,GERD  Medicated and Controlled,,  Endo/Other  negative endocrine ROS    Renal/GU CRFRenal disease  negative genitourinary   Musculoskeletal  (+) Arthritis ,    Abdominal  (+) + obese  Peds  Hematology  (+) Blood dyscrasia, anemia   Anesthesia Other Findings Past Medical History: No date: Aortic atherosclerosis (HCC) No date: Arthritis No date: Atrial fibrillation [facility])     Comment:  a.) CHA2DS2-VASc = 6 (age x2, sex, CHF, HTN, vascular               disease history) as of 06/07/2023; b.) s/p DCCV at [facility]              05/12/2020 (200 J x 1, 360 J x1) --> NSR; c.) s/p DCCV at              [facility] 05/18/2020 (150 J x 1, 200 J x1) --> NSR; d.)  cardiac rate/rhythm maintained on oral amiodarone;               chronically anticoagulated using apixaban No date: CAD (coronary artery disease) No date: CHF (congestive heart failure) (HCC)     Comment:  a.) TTE 10/27/2019: EF 55%, inf/inferolat breast               attenuation; b.) TTE 11/05/2019: EF 50-55%, no RWMAs,               G1DD, mild  MR/PR; c.) TTE 07/29/2020: EF 50-55%, mild               LVH, G1DD, mod LAE, mod MR, mild-mod TR, PASP 44 No date: CKD (chronic kidney disease) stage 3, GFR 30-59 ml/min (HCC) No date: Gastric polyps No date: GERD (gastroesophageal reflux disease) 03/30/2020: History of 2019 novel coronavirus disease (COVID-19) No date: History of bilateral cataract extraction No date: Hyperlipidemia No date: Hypertension No date: IDA (iron deficiency anemia) No date: Long term current use of amiodarone No date: On apixaban therapy 06/07/2020: Pulmonary hypertension (HCC)     Comment:  a.) CT chest 06/07/2020: dilated main PA suggesting               pHTN; b.)TTE 07/29/2020: PASP 44 mmHg No date: SNHL (sensorineural hearing loss) No date: Systolic murmur   Reproductive/Obstetrics negative OB ROS                              Anesthesia Physical Anesthesia Plan  ASA: 4  Anesthesia Plan: General   Post-op Pain Management: Minimal or no pain anticipated   Induction: Intravenous  PONV Risk Score and Plan: 2 and Propofol infusion, TIVA and Treatment may vary due to age or medical condition  Airway Management Planned: Natural Airway and Simple Face Mask  Additional Equipment:   Intra-op Plan:   Post-operative Plan:   Informed Consent: I have reviewed the patients History and Physical, chart, labs and discussed the procedure including the risks, benefits and alternatives for the proposed anesthesia with the patient or authorized representative who has indicated his/her understanding and acceptance.     Dental advisory given  Plan Discussed with: CRNA  Anesthesia Plan Comments:          Anesthesia Quick Evaluation

## 2023-07-12 NOTE — Progress Notes (Signed)
Pt has been made aware of normal result and verbalized understanding.  jw

## 2023-07-12 NOTE — Progress Notes (Signed)
Spoke to pt and instructed them to come at [facility] and to be NPO after 0000. Confirmed no missed doses of AC and instructed to take in AM with a small sip of water.   Confirmed that pt will have a ride home and someone to stay with them for 24 hours after the procedure.

## 2023-07-13 ENCOUNTER — Ambulatory Visit (HOSPITAL_COMMUNITY): Payer: Medicare HMO | Admitting: Anesthesiology

## 2023-07-13 ENCOUNTER — Ambulatory Visit (HOSPITAL_COMMUNITY)
Admission: RE | Admit: 2023-07-13 | Discharge: 2023-07-13 | Disposition: A | Discharge status: Home / Self Care | Payer: Medicare HMO | Attending: Cardiovascular Disease | Admitting: Cardiovascular Disease

## 2023-07-13 ENCOUNTER — Encounter (HOSPITAL_COMMUNITY)
Admission: RE | Disposition: A | Discharge status: Home / Self Care | Payer: Self-pay | Source: Home / Self Care | Attending: Cardiovascular Disease

## 2023-07-13 ENCOUNTER — Other Ambulatory Visit: Payer: Self-pay

## 2023-07-13 ENCOUNTER — Encounter (HOSPITAL_COMMUNITY): Payer: Self-pay | Admitting: Cardiovascular Disease

## 2023-07-13 ENCOUNTER — Ambulatory Visit (HOSPITAL_BASED_OUTPATIENT_CLINIC_OR_DEPARTMENT_OTHER): Payer: Medicare HMO | Admitting: Anesthesiology

## 2023-07-13 DIAGNOSIS — I5032 Chronic diastolic (congestive) heart failure: Secondary | ICD-10-CM | POA: Diagnosis not present

## 2023-07-13 DIAGNOSIS — K219 Gastro-esophageal reflux disease without esophagitis: Secondary | ICD-10-CM | POA: Diagnosis not present

## 2023-07-13 DIAGNOSIS — E785 Hyperlipidemia, unspecified: Secondary | ICD-10-CM | POA: Insufficient documentation

## 2023-07-13 DIAGNOSIS — Z955 Presence of coronary angioplasty implant and graft: Secondary | ICD-10-CM | POA: Insufficient documentation

## 2023-07-13 DIAGNOSIS — I480 Paroxysmal atrial fibrillation: Secondary | ICD-10-CM | POA: Insufficient documentation

## 2023-07-13 DIAGNOSIS — I130 Hypertensive heart and chronic kidney disease with heart failure and stage 1 through stage 4 chronic kidney disease, or unspecified chronic kidney disease: Secondary | ICD-10-CM | POA: Diagnosis not present

## 2023-07-13 DIAGNOSIS — N183 Chronic kidney disease, stage 3 unspecified: Secondary | ICD-10-CM | POA: Insufficient documentation

## 2023-07-13 DIAGNOSIS — Z7901 Long term (current) use of anticoagulants: Secondary | ICD-10-CM | POA: Insufficient documentation

## 2023-07-13 DIAGNOSIS — I4891 Unspecified atrial fibrillation: Secondary | ICD-10-CM

## 2023-07-13 DIAGNOSIS — I2510 Atherosclerotic heart disease of native coronary artery without angina pectoris: Secondary | ICD-10-CM | POA: Diagnosis not present

## 2023-07-13 DIAGNOSIS — I700 Atherosclerosis of aorta: Secondary | ICD-10-CM | POA: Diagnosis not present

## 2023-07-13 DIAGNOSIS — Z79899 Other long term (current) drug therapy: Secondary | ICD-10-CM | POA: Diagnosis not present

## 2023-07-13 HISTORY — PX: CARDIOVERSION: EP1203

## 2023-07-13 SURGERY — CARDIOVERSION (CATH LAB)
Anesthesia: General

## 2023-07-13 MED ORDER — LIDOCAINE 2% (20 MG/ML) 5 ML SYRINGE
INTRAMUSCULAR | Status: DC | PRN
  Administered 2023-07-13: 100 mg via INTRAVENOUS

## 2023-07-13 MED ORDER — PROPOFOL 10 MG/ML IV BOLUS
INTRAVENOUS | Status: DC | PRN
  Administered 2023-07-13: 60 mg via INTRAVENOUS

## 2023-07-13 MED ORDER — SODIUM CHLORIDE 0.9% FLUSH: 10.0000 mL | Freq: Two times a day (BID) | INTRAVENOUS | Status: DC

## 2023-07-13 MED ORDER — HYDROCORTISONE 1 % EX CREA: 1.0000 | TOPICAL_CREAM | Freq: Three times a day (TID) | CUTANEOUS | Status: DC | PRN

## 2023-07-13 SURGICAL SUPPLY — 1 items: PAD DEFIB RADIO PHYSIO CONN (PAD) ×1 IMPLANT

## 2023-07-13 NOTE — Anesthesia Postprocedure Evaluation (Signed)
Anesthesia Post Note  Patient: Amanda Frazier  Procedure(s) Performed: CARDIOVERSION (CATH LAB)     Patient location during evaluation: PACU Anesthesia Type: General Level of consciousness: awake and alert Pain management: pain level controlled Vital Signs Assessment: post-procedure vital signs reviewed and stable Respiratory status: spontaneous breathing Cardiovascular status: stable Anesthetic complications: no   No notable events documented.  Last Vitals:  Vitals:   07/13/23 0857 07/13/23 0900  BP: (!) 116/57 (!) 112/52  Pulse: (!) 56 (!) 58  Resp: 20 (!) 22  Temp:    SpO2: 99% 97%    Last Pain:  Vitals:   07/13/23 0851  TempSrc: Temporal  PainSc: 0-No pain                 Lewie Loron

## 2023-07-13 NOTE — Transfer of Care (Signed)
Immediate Anesthesia Transfer of Care Note  Patient: Amanda Frazier  Procedure(s) Performed: CARDIOVERSION (CATH LAB)  Patient Location: PACU and Cath Lab  Anesthesia Type:MAC  Level of Consciousness: sedated  Airway & Oxygen Therapy: Patient Spontanous Breathing and Patient connected to nasal cannula oxygen  Post-op Assessment: Report given to RN and Post -op Vital signs reviewed and stable  Post vital signs: Reviewed and stable  Last Vitals:  Vitals Value Taken Time  BP    Temp    Pulse 94 07/13/23 0828  Resp 20 07/13/23 0828  SpO2 99 % 07/13/23 0828  Vitals shown include unfiled device data.  Last Pain:  Vitals:   07/13/23 0732  TempSrc: Temporal  PainSc: 0-No pain         Complications: No notable events documented.

## 2023-07-13 NOTE — Anesthesia Procedure Notes (Signed)
Procedure Name: MAC Date/Time: 07/13/2023 8:31 AM  Performed by: Debbe Odea, CRNAPre-anesthesia Checklist: Patient identified, Emergency Drugs available, Suction available, Patient being monitored and Timeout performed Patient Re-evaluated:Patient Re-evaluated prior to induction Oxygen Delivery Method: Nasal cannula Preoxygenation: Pre-oxygenation with 100% oxygen Induction Type: IV induction

## 2023-07-13 NOTE — CV Procedure (Signed)
DCC: On Rx eliquis with no misssed doses Anesthesia: Propofol  DCC x 2 150 J biphasic then 200 J biphasic Converted from afib rate 88 to NSR rate 74 bpm  No immediate neurologic sequelae  Charlton Haws MD [facility]

## 2023-07-13 NOTE — Interval H&P Note (Signed)
History and Physical Interval Note:  07/13/2023 7:30 AM  Amanda Frazier  has presented today for surgery, with the diagnosis of a fib.  The various methods of treatment have been discussed with the patient and family. After consideration of risks, benefits and other options for treatment, the patient has consented to  Procedure(s): CARDIOVERSION (CATH LAB) (N/A) as a surgical intervention.  The patient's history has been reviewed, patient examined, no change in status, stable for surgery.  I have reviewed the patient's chart and labs.  Questions were answered to the patient's satisfaction.     Charlton Haws

## 2023-07-16 ENCOUNTER — Other Ambulatory Visit: Payer: Self-pay

## 2023-07-16 DIAGNOSIS — D500 Iron deficiency anemia secondary to blood loss (chronic): Secondary | ICD-10-CM

## 2023-07-16 DIAGNOSIS — E538 Deficiency of other specified B group vitamins: Secondary | ICD-10-CM

## 2023-07-17 ENCOUNTER — Inpatient Hospital Stay: Payer: Medicare HMO | Attending: Hematology

## 2023-07-17 ENCOUNTER — Inpatient Hospital Stay (HOSPITAL_BASED_OUTPATIENT_CLINIC_OR_DEPARTMENT_OTHER): Payer: Medicare HMO | Admitting: Hematology

## 2023-07-17 VITALS — BP 141/48 | HR 64 | Temp 97.7°F | Resp 16 | Ht 63.0 in | Wt 186.8 lb

## 2023-07-17 DIAGNOSIS — D500 Iron deficiency anemia secondary to blood loss (chronic): Secondary | ICD-10-CM

## 2023-07-17 DIAGNOSIS — Z79899 Other long term (current) drug therapy: Secondary | ICD-10-CM | POA: Insufficient documentation

## 2023-07-17 DIAGNOSIS — D509 Iron deficiency anemia, unspecified: Secondary | ICD-10-CM | POA: Insufficient documentation

## 2023-07-17 DIAGNOSIS — I4891 Unspecified atrial fibrillation: Secondary | ICD-10-CM | POA: Insufficient documentation

## 2023-07-17 DIAGNOSIS — Z7901 Long term (current) use of anticoagulants: Secondary | ICD-10-CM | POA: Diagnosis not present

## 2023-07-17 DIAGNOSIS — E538 Deficiency of other specified B group vitamins: Secondary | ICD-10-CM

## 2023-07-17 LAB — CMP (CANCER CENTER ONLY)
ALT: 8 U/L (ref 0–44)
AST: 16 U/L (ref 15–41)
Albumin: 4 g/dL (ref 3.5–5.0)
Alkaline Phosphatase: 68 U/L (ref 38–126)
Anion gap: 5 (ref 5–15)
BUN: 23 mg/dL (ref 8–23)
CO2: 27 mmol/L (ref 22–32)
Calcium: 9.8 mg/dL (ref 8.9–10.3)
Chloride: 107 mmol/L (ref 98–111)
Creatinine: 1.42 mg/dL — ABNORMAL HIGH (ref 0.44–1.00)
GFR, Estimated: 38 mL/min — ABNORMAL LOW (ref >60)
Glucose, Bld: 111 mg/dL — ABNORMAL HIGH (ref 70–99)
Potassium: 4.3 mmol/L (ref 3.5–5.1)
Sodium: 139 mmol/L (ref 135–145)
Total Bilirubin: 1 mg/dL (ref <1.2)
Total Protein: 7.4 g/dL (ref 6.5–8.1)

## 2023-07-17 LAB — CBC WITH DIFFERENTIAL (CANCER CENTER ONLY)
Abs Immature Granulocytes: 0 10*3/uL (ref 0.00–0.07)
Basophils Absolute: 0 10*3/uL (ref 0.0–0.1)
Basophils Relative: 0 %
Eosinophils Absolute: 0 10*3/uL (ref 0.0–0.5)
Eosinophils Relative: 1 %
HCT: 32.1 % — ABNORMAL LOW (ref 36.0–46.0)
Hemoglobin: 10.2 g/dL — ABNORMAL LOW (ref 12.0–15.0)
Immature Granulocytes: 0 %
Lymphocytes Relative: 26 %
Lymphs Abs: 0.8 10*3/uL (ref 0.7–4.0)
MCH: 29.2 pg (ref 26.0–34.0)
MCHC: 31.8 g/dL (ref 30.0–36.0)
MCV: 92 fL (ref 80.0–100.0)
Monocytes Absolute: 0.3 10*3/uL (ref 0.1–1.0)
Monocytes Relative: 10 %
Neutro Abs: 2 10*3/uL (ref 1.7–7.7)
Neutrophils Relative %: 63 %
Platelet Count: 186 10*3/uL (ref 150–400)
RBC: 3.49 MIL/uL — ABNORMAL LOW (ref 3.87–5.11)
RDW: 14.5 % (ref 11.5–15.5)
WBC Count: 3.2 10*3/uL — ABNORMAL LOW (ref 4.0–10.5)
nRBC: 0 % (ref 0.0–0.2)

## 2023-07-17 LAB — VITAMIN B12: Vitamin B-12: 1478 pg/mL — ABNORMAL HIGH (ref 180–914)

## 2023-07-17 LAB — IRON AND IRON BINDING CAPACITY (CC-WL,HP ONLY)
Iron: 24 ug/dL — ABNORMAL LOW (ref 28–170)
Saturation Ratios: 8 % — ABNORMAL LOW (ref 10.4–31.8)
TIBC: 308 ug/dL (ref 250–450)
UIBC: 284 ug/dL (ref 148–442)

## 2023-07-17 LAB — FERRITIN: Ferritin: 167 ng/mL (ref 11–307)

## 2023-07-17 NOTE — Progress Notes (Signed)
HEMATOLOGY/ONCOLOGY CLINIC VISIT NOTE  Date of Service: 07/17/23    Patient Care Team: Mila Palmer, MD as PCP - General (Family Medicine) Yates Decamp, MD as PCP - Cardiology (Cardiology)  CHIEF COMPLAINTS/PURPOSE OF CONSULTATION:  Follow-up for continued evaluation and management of iron deficiency anemia  HISTORY OF PRESENTING ILLNESS: See previous note for details  Interval history  Amanda Frazier is here for continued evaluation and management of iron deficiency anemia.  Patient was last seen by me on 01/16/2023 and she complained of acid reflux and  hypertension.  She had right total knee arthroplasty on 06/11/2023. She was hospitalized soon after her surgery, on 06/13/2023, due to patient having asymptomatic A-fib with RVR after painful session with physical therapy. Her Amiodarone was increased to 100 mg daily at discharge, 06/14/2023. She had cardioversion (cath lab) on 07/13/2023.   Patient notes she has been doing well overall after her total right knee arthroplasty and after his discharge. She denies any new infection issues, fever, chills, night sweats, bleeding, blood in stool, black stool, fatigue, chest pain, back pain, or leg swelling.   Patient has had 5 polyps removed since our last visit.  She continues to take Pepcid for acid reflux. She takes 20 mg Pepcid twice daily. She regularly takes B-Complex, denies iron supplement. Patient is on the same dose of Eliquis, 5mg .   Patient notes she has been eating well and has been staying well hydrated.   She continues to follow-up with his Cardiologist and PCP. She has been discharged from her Gastroenterologist.    MEDICAL HISTORY:  Past Medical History:  Diagnosis Date   (HFpEF) heart failure with preserved ejection fraction (HCC) 05/10/2020   TTE 07/29/2020: EF 50-55, GR 1 DD, moderate LAE, moderate MR, PASP 44  TTE 06/14/2023: EF 70-75, mild LVH, trivial MR, mild AV calcification, RAP 3     Aortic  atherosclerosis (HCC)    Arthritis    Atrial fibrillation (HCC)    a.) CHA2DS2-VASc = 6 (age x2, sex, CHF, HTN, vascular disease history) as of 06/07/2023; b.) s/p DCCV at [facility] 05/12/2020 (200 J x 1, 360 J x1) --> NSR; c.) s/p DCCV at [facility] 05/18/2020 (150 J x 1, 200 J x1) --> NSR; d.) cardiac rate/rhythm maintained on oral amiodarone; chronically anticoagulated using apixaban   CAD (coronary artery disease)    CHF (congestive heart failure) (HCC)    a.) TTE 10/27/2019: EF 55%, inf/inferolat breast attenuation; b.) TTE 11/05/2019: EF 50-55%, no RWMAs, G1DD, mild MR/PR; c.) TTE 07/29/2020: EF 50-55%, mild LVH, G1DD, mod LAE, mod MR, mild-mod TR, PASP 44   CKD (chronic kidney disease) stage 3, GFR 30-59 ml/min (HCC)    Gastric polyps    GERD (gastroesophageal reflux disease)    History of 2019 novel coronavirus disease (COVID-19) 03/30/2020   History of bilateral cataract extraction    Hyperlipidemia    Hypertension    IDA (iron deficiency anemia)    Long term current use of amiodarone    On apixaban therapy    Pulmonary hypertension (HCC) 06/07/2020   a.) CT chest 06/07/2020: dilated main PA suggesting pHTN; b.)TTE 07/29/2020: PASP 44 mmHg   SNHL (sensorineural hearing loss)    Systolic murmur   Gastric polyps - bleednig Tubular adenoma - on recent colonoscopy Gastric ulcer as a teenager -- on prilosec >30 yrs Covid 02/2021-- CHF after COVID 19  SURGICAL HISTORY: Past Surgical History:  Procedure Laterality Date   BIOPSY  07/12/2021   Procedure: BIOPSY;  Surgeon: Charlott Rakes, MD;  Location: [facility];  Service: Endoscopy;;   CARDIOVERSION N/A 05/12/2020   Procedure: CARDIOVERSION; Location: [facility]; Surgeon: Vilinda Boehringer, MD   CARDIOVERSION N/A 05/18/2020   Procedure: CARDIOVERSION; Location: [facility]; Surgeon: Wille Glaser, MD   CARDIOVERSION N/A 07/13/2023   Procedure: CARDIOVERSION (CATH LAB);  Surgeon: Wendall Stade, MD;  Location: [facility] INVASIVE CV  LAB;  Service: Cardiovascular;  Laterality: N/A;   CATARACT EXTRACTION, BILATERAL     COLONOSCOPY WITH PROPOFOL N/A 07/12/2021   Procedure: COLONOSCOPY WITH PROPOFOL;  Surgeon: Charlott Rakes, MD;  Location: [facility] ENDOSCOPY;  Service: Endoscopy;  Laterality: N/A;   ESOPHAGOGASTRODUODENOSCOPY (EGD) WITH PROPOFOL N/A 07/12/2021   Procedure: ESOPHAGOGASTRODUODENOSCOPY (EGD) WITH PROPOFOL;  Surgeon: Charlott Rakes, MD;  Location: [facility] ENDOSCOPY;  Service: Endoscopy;  Laterality: N/A;   KNEE ARTHROPLASTY Right 06/11/2023   Procedure: COMPUTER ASSISTED TOTAL KNEE ARTHROPLASTY;  Surgeon: Donato Heinz, MD;  Location: [facility] ORS;  Service: Orthopedics;  Laterality: Right;   POLYPECTOMY  07/12/2021   Procedure: POLYPECTOMY;  Surgeon: Charlott Rakes, MD;  Location: [facility] ENDOSCOPY;  Service: Endoscopy;;   REPLACEMENT TOTAL KNEE Left 2015   SCLEROTHERAPY  07/12/2021   Procedure: SCLEROTHERAPY;  Surgeon: Charlott Rakes, MD;  Location: [facility] ENDOSCOPY;  Service: Endoscopy;;   SUBMUCOSAL TATTOO INJECTION  07/12/2021   Procedure: SUBMUCOSAL TATTOO INJECTION;  Surgeon: Charlott Rakes, MD;  Location: [facility] ENDOSCOPY;  Service: Endoscopy;;   TUBAL LIGATION      SOCIAL HISTORY: Social History   Socioeconomic History   Marital status: Married    Spouse name: Not on file   Number of children: 2   Years of education: Not on file   Highest education level: Not on file  Occupational History   Not on file  Tobacco Use   Smoking status: Never   Smokeless tobacco: Never  Vaping Use   Vaping status: Never Used  Substance and Sexual Activity   Alcohol use: Never   Drug use: Never   Sexual activity: Not on file  Other Topics Concern   Not on file  Social History Narrative   Not on file   Social Determinants of Health   Financial Resource Strain: Low Risk  (05/21/2020)   Received from [facility], [facility]   Overall Financial Resource Strain (CARDIA)    Difficulty of Paying Living Expenses:  Not very hard  Food Insecurity: No Food Insecurity (06/14/2023)   Hunger Vital Sign    Worried About Running Out of Food in the Last Year: Never true    Ran Out of Food in the Last Year: Never true  Transportation Needs: No Transportation Needs (06/14/2023)   PRAPARE - Administrator, Civil Service (Medical): No    Lack of Transportation (Non-Medical): No  Physical Activity: Not on file  Stress: No Stress Concern Present (05/21/2020)   Received from [facility], [facility] - Occupational Stress Questionnaire    Feeling of Stress : Not at all  Social Connections: Unknown (01/10/2022)   Received from [facility], [facility]   Social Network    Social Network: Not on file  Intimate Partner Violence: Not At Risk (06/14/2023)   Humiliation, Afraid, Rape, and Kick questionnaire    Fear of Current or Ex-Partner: No    Emotionally Abused: No    Physically Abused: No    Sexually Abused: No    FAMILY HISTORY: Family History  Problem Relation Age of Onset   Breast  cancer Maternal Aunt    Atrial fibrillation Mother    Hyperlipidemia Mother    Lung cancer Mother    Atrial fibrillation Father    Colon cancer Father    Breast cancer Sister    Leukemia Brother     ALLERGIES:  is allergic to celecoxib, fosamax [alendronate sodium], lipitor [atorvastatin calcium], valsartan-hydrochlorothiazide, benazepril, nickel, palladium chloride, simvastatin, and valsartan.  MEDICATIONS:  Current Outpatient Medications  Medication Sig Dispense Refill   acetaminophen (TYLENOL) 325 MG tablet Take 1-2 tablets (325-650 mg total) by mouth every 6 (six) hours as needed for mild pain (pain score 1-3) (pain score 1-3 or temp > 100.5). 60 tablet 1   amiodarone (PACERONE) 200 MG tablet Take 0.5 tablets (100 mg total) by mouth daily. (Patient taking differently: Take 200 mg by mouth in the morning.)     amLODipine (NORVASC) 5 MG tablet Take 2 tablets  (10 mg total) by mouth daily. 180 tablet 3   apixaban (ELIQUIS) 5 MG TABS tablet TAKE 1 TABLET BY MOUTH TWICE A DAY 180 tablet 1   B Complex Vitamins (B-COMPLEX/B-12) LIQD Place 1 mL under the tongue in the morning.     calcium carbonate (TUMS - DOSED IN MG ELEMENTAL CALCIUM) 500 MG chewable tablet Chew 2 tablets by mouth daily.     carboxymethylcellulose (REFRESH TEARS) 0.5 % SOLN Place 1-2 drops into both eyes 3 (three) times daily as needed (dry/irritated eyes.).     Cholecalciferol (VITAMIN D3) 125 MCG (5000 UT) CAPS Take 5,000 Units by mouth in the morning.     famotidine (PEPCID) 20 MG tablet Take 20 mg by mouth 2 (two) times daily.     losartan (COZAAR) 50 MG tablet Take 50 mg by mouth at bedtime.     rosuvastatin (CRESTOR) 20 MG tablet Take 1 tablet (20 mg total) by mouth daily. (Patient taking differently: Take 20 mg by mouth at bedtime.) 90 tablet 1   No current facility-administered medications for this visit.    REVIEW OF SYSTEMS:   10 Point review of Systems was done is negative except as noted above.  PHYSICAL EXAMINATION:  .BP (!) 141/48 (BP Location: Right Arm, Patient Position: Sitting) Comment: nurse notified  Pulse 64   Temp 97.7 F (36.5 C)   Resp 16   Ht 5\' 3"  (1.6 m)   Wt 186 lb 12.8 oz (84.7 kg)   SpO2 100%   BMI 33.09 kg/m  . GENERAL:alert, in no acute distress and comfortable SKIN: no acute rashes, no significant lesions EYES: conjunctiva are pink and non-injected, sclera anicteric OROPHARYNX: MMM, no exudates, no oropharyngeal erythema or ulceration NECK: supple, no JVD LYMPH:  no palpable lymphadenopathy in the cervical, axillary or inguinal regions LUNGS: clear to auscultation b/l with normal respiratory effort HEART: regular rate & rhythm ABDOMEN:  normoactive bowel sounds , non tender, not distended. Extremity: no pedal edema PSYCH: alert & oriented x 3 with fluent speech NEURO: no focal motor/sensory deficits   LABORATORY DATA:   .     Latest Ref Rng & Units 07/17/2023    9:50 AM 07/11/2023    4:06 PM 06/13/2023    5:46 PM  CBC  WBC 4.0 - 10.5 K/uL 3.2  4.0    Hemoglobin 12.0 - 15.0 g/dL 16.1  9.7  09.6   Hematocrit 36.0 - 46.0 % 32.1  30.8  30.0   Platelets 150 - 400 K/uL 186  234     .CBC    Component Value Date/Time  WBC 3.2 (L) 07/17/2023 0950   WBC 13.0 (H) 06/13/2023 1651   RBC 3.49 (L) 07/17/2023 0950   HGB 10.2 (L) 07/17/2023 0950   HGB 9.7 (L) 07/11/2023 1606   HCT 32.1 (L) 07/17/2023 0950   HCT 30.8 (L) 07/11/2023 1606   PLT 186 07/17/2023 0950   PLT 234 07/11/2023 1606   MCV 92.0 07/17/2023 0950   MCV 91 07/11/2023 1606   MCV 83 07/22/2014 1459   MCH 29.2 07/17/2023 0950   MCHC 31.8 07/17/2023 0950   RDW 14.5 07/17/2023 0950   RDW 13.1 07/11/2023 1606   RDW 14.8 (H) 07/22/2014 1459   LYMPHSABS 0.8 07/17/2023 0950   MONOABS 0.3 07/17/2023 0950   EOSABS 0.0 07/17/2023 0950   BASOSABS 0.0 07/17/2023 0950       Latest Ref Rng & Units 07/17/2023    9:50 AM 07/11/2023    4:06 PM 06/13/2023    5:46 PM  CMP  Glucose 70 - 99 mg/dL 440  98  102   BUN 8 - 23 mg/dL 23  23  22    Creatinine 0.44 - 1.00 mg/dL 7.25  3.66  4.40   Sodium 135 - 145 mmol/L 139  141  135   Potassium 3.5 - 5.1 mmol/L 4.3  4.7  4.5   Chloride 98 - 111 mmol/L 107  105  100   CO2 22 - 32 mmol/L 27  22    Calcium 8.9 - 10.3 mg/dL 9.8  9.7    Total Protein 6.5 - 8.1 g/dL 7.4     Total Bilirubin <1.2 mg/dL 1.0     Alkaline Phos 38 - 126 U/L 68     AST 15 - 41 U/L 16     ALT 0 - 44 U/L 8       Lab Results  Component Value Date   IRON 24 (L) 07/17/2023   TIBC 308 07/17/2023   IRONPCTSAT 8 (L) 07/17/2023   (Iron and TIBC)  Lab Results  Component Value Date   FERRITIN 167 07/17/2023   Final Pathologic Diagnosis      A. STOMACH, POLYPS, POLYPECTOMY:  Hyperplastic polyps. Negative for H. pylori by immunohistochemical stain. Negative for intestinal metaplasia, dysplasia, or malignancy.    Electronically signed  by Allyn Kenner, MD on 08/25/2021 at  1:15 PM    RADIOGRAPHIC STUDIES: I have personally reviewed the radiological images as listed and agreed with the findings in the report. Tissue Exam from 05/16/2022: Final Pathologic Diagnosis   POLYP, STOMACH, POLYPECTOMY:              Hyperplastic polyp with mucosal erosion and focal active inflammation.               No Helicobacter-like organisms are identified on H&E stained slides.              Negative for intestinal metaplasia, dyplasia and malignancy.    ASSESSMENT & PLAN:   77 year old female with  #1 Normocytic/microcytic Anemia -due to severe iron deficiency.  #2 Severe iron deficiency. Likely due to GI bleeding from gastric polyps in the context of being on anticoagulation . Patient also has been on chronic PPI therapy for more than 20 years which would reduce oral iron absorption from food.  #3  History of gastric polyps with a bleeding Patient has had resection of her remaining gastric polyps at [facility] on 08/19/2021.  Pathology is negative for Helicobacter pylori and showed hyperplastic polyps.  #4 Atrial  fibrillation on Eliquis  PLAN: -Discussed lab results from today, 07/17/2023, in detail with the patient. CBC shows low WBC of 3.2 K, low hemoglobin at 10.2 g/dL, and decreased hematocrit of 32.1%. CMP stable with CKD -Discussed the option of IV iron if Ferritin drops below 100 or iron saturation of <20%. Patient agrees.  -Iron labs show ferritin of 167 with iron saturation of 8% -will offer patient additional IV iron replacement to targt ferritin to close to 250 and iron satuation of atleast 20% especially in the context of anemia and CKD -Recommend influenza vaccine, COVID-19 Booster, RSV vaccine, and other age related vaccines.  -Answered all of patient's questions.  Follow-up: IV venofer 300mg  weekly x 3 doses Phone visit with Dr Candise Che in 6 months with labs 1 day prior  The total time spent in  the appointment was 30 minutes* .  All of the patient's questions were answered with apparent satisfaction. The patient knows to call the clinic with any problems, questions or concerns.   Wyvonnia Lora MD MS AAHIVMS [facility] Hematology/Oncology Physician [facility]  .*Total Encounter Time as defined by the Centers for Medicare and Medicaid Services includes, in addition to the face-to-face time of a patient visit (documented in the note above) non-face-to-face time: obtaining and reviewing outside history, ordering and reviewing medications, tests or procedures, care coordination (communications with other health care professionals or caregivers) and documentation in the medical record.   I,Param Shah,acting as a Neurosurgeon for Wyvonnia Lora, MD.,have documented all relevant documentation on the behalf of Wyvonnia Lora, MD,as directed by  Wyvonnia Lora, MD while in the presence of Wyvonnia Lora, MD.

## 2023-07-19 ENCOUNTER — Ambulatory Visit (HOSPITAL_COMMUNITY)
Admission: RE | Admit: 2023-07-19 | Discharge: 2023-07-19 | Disposition: A | Discharge status: Home / Self Care | Payer: Medicare HMO | Source: Ambulatory Visit | Attending: Internal Medicine | Admitting: Internal Medicine

## 2023-07-19 VITALS — BP 150/52 | HR 70 | Ht 63.0 in | Wt 187.0 lb

## 2023-07-19 DIAGNOSIS — I4891 Unspecified atrial fibrillation: Secondary | ICD-10-CM | POA: Diagnosis not present

## 2023-07-19 DIAGNOSIS — I700 Atherosclerosis of aorta: Secondary | ICD-10-CM | POA: Diagnosis not present

## 2023-07-19 DIAGNOSIS — I2510 Atherosclerotic heart disease of native coronary artery without angina pectoris: Secondary | ICD-10-CM | POA: Diagnosis not present

## 2023-07-19 DIAGNOSIS — N183 Chronic kidney disease, stage 3 unspecified: Secondary | ICD-10-CM | POA: Diagnosis present

## 2023-07-19 DIAGNOSIS — D631 Anemia in chronic kidney disease: Secondary | ICD-10-CM | POA: Insufficient documentation

## 2023-07-19 DIAGNOSIS — I4819 Other persistent atrial fibrillation: Secondary | ICD-10-CM | POA: Insufficient documentation

## 2023-07-19 DIAGNOSIS — R9431 Abnormal electrocardiogram [ECG] [EKG]: Secondary | ICD-10-CM | POA: Insufficient documentation

## 2023-07-19 DIAGNOSIS — Z7901 Long term (current) use of anticoagulants: Secondary | ICD-10-CM | POA: Diagnosis not present

## 2023-07-19 DIAGNOSIS — I130 Hypertensive heart and chronic kidney disease with heart failure and stage 1 through stage 4 chronic kidney disease, or unspecified chronic kidney disease: Secondary | ICD-10-CM | POA: Insufficient documentation

## 2023-07-19 DIAGNOSIS — Z79899 Other long term (current) drug therapy: Secondary | ICD-10-CM | POA: Insufficient documentation

## 2023-07-19 DIAGNOSIS — D6869 Other thrombophilia: Secondary | ICD-10-CM | POA: Insufficient documentation

## 2023-07-19 DIAGNOSIS — I5032 Chronic diastolic (congestive) heart failure: Secondary | ICD-10-CM | POA: Insufficient documentation

## 2023-07-19 DIAGNOSIS — K219 Gastro-esophageal reflux disease without esophagitis: Secondary | ICD-10-CM | POA: Insufficient documentation

## 2023-07-19 DIAGNOSIS — Z5181 Encounter for therapeutic drug level monitoring: Secondary | ICD-10-CM

## 2023-07-19 NOTE — Progress Notes (Signed)
`   Primary Care Physician: Mila Palmer, MD Primary Cardiologist: Yates Decamp, MD Electrophysiologist: None     Referring Physician: Tereso Newcomer, PA-C     Amanda Frazier is a 77 y.o. female with a history of HFpEF, HTN, HLD, CKD stage III, anemia, GERD, aortic and coronary artery atherosclerosis, and persistent atrial fibrillation who presents for consultation in the [facility]. Patient was increased from amiodarone 100 mg to 200 mg daily after noting rate controlled Afib on 06/20/23; s/p successful DCCV on 07/13/23 x 3 shocks. Patient is on Eliquis 5 mg BID for a CHADS2VASC score of 5.  On evaluation today, she is currently in NSR. She is back to original dose of amiodarone 100 mg daily following cardioversion. She feels well and attributes most recent episode secondary to knee replacement pain.   Today, she denies symptoms of palpitations, chest pain, shortness of breath, orthopnea, PND, lower extremity edema, dizziness, presyncope, syncope, snoring, daytime somnolence, bleeding, or neurologic sequela. The patient is tolerating medications without difficulties and is otherwise without complaint today.    she has a BMI of Body mass index is 33.13 kg/m.Marland Kitchen Filed Weights   07/19/23 1019  Weight: 84.8 kg    Current Outpatient Medications  Medication Sig Dispense Refill   acetaminophen (TYLENOL) 325 MG tablet Take 1-2 tablets (325-650 mg total) by mouth every 6 (six) hours as needed for mild pain (pain score 1-3) (pain score 1-3 or temp > 100.5). 60 tablet 1   amiodarone (PACERONE) 200 MG tablet Take 0.5 tablets (100 mg total) by mouth daily. (Patient taking differently: Take 100 mg by mouth in the morning.)     amLODipine (NORVASC) 5 MG tablet Take 2 tablets (10 mg total) by mouth daily. 180 tablet 3   apixaban (ELIQUIS) 5 MG TABS tablet TAKE 1 TABLET BY MOUTH TWICE A DAY 180 tablet 1   B Complex Vitamins (B-COMPLEX/B-12) LIQD Place 1 mL under the tongue  in the morning.     calcium carbonate (TUMS - DOSED IN MG ELEMENTAL CALCIUM) 500 MG chewable tablet Chew 2 tablets by mouth daily.     carboxymethylcellulose (REFRESH TEARS) 0.5 % SOLN Place 1-2 drops into both eyes 3 (three) times daily as needed (dry/irritated eyes.).     Cholecalciferol (VITAMIN D3) 125 MCG (5000 UT) CAPS Take 5,000 Units by mouth in the morning.     famotidine (PEPCID) 20 MG tablet Take 20 mg by mouth 2 (two) times daily.     losartan (COZAAR) 50 MG tablet Take 50 mg by mouth at bedtime.     rosuvastatin (CRESTOR) 20 MG tablet Take 1 tablet (20 mg total) by mouth daily. (Patient taking differently: Take 20 mg by mouth at bedtime.) 90 tablet 1   No current facility-administered medications for this encounter.    Atrial Fibrillation Management history:  Previous antiarrhythmic drugs: amiodarone Previous cardioversions: 04/2020, 07/13/23  Previous ablations: none Anticoagulation history: Eliquis   ROS- All systems are reviewed and negative except as per the HPI above.  Physical Exam: BP (!) 150/52   Pulse 70   Ht 5\' 3"  (1.6 m)   Wt 84.8 kg   BMI 33.13 kg/m   GEN: Well nourished, well developed in no acute distress NECK: No JVD; No carotid bruits CARDIAC: Regular rate and rhythm, no murmurs, rubs, gallops RESPIRATORY:  Clear to auscultation without rales, wheezing or rhonchi  ABDOMEN: Soft, non-tender, non-distended EXTREMITIES:  No edema; No deformity   EKG today demonstrates  Vent.  rate 70 BPM PR interval 184 ms QRS duration 96 ms QT/QTcB 414/447 ms P-R-T axes * -14 64 Sinus rhythm with Premature atrial complexes Septal infarct , age undetermined Lateral infarct , age undetermined Possible Inferior infarct , age undetermined Abnormal ECG When compared with ECG of 13-Jul-2023 08:44, PREVIOUS ECG IS PRESENT  Echo 06/14/23 demonstrated  1. Left ventricular ejection fraction, by estimation, is 70 to 75%. The  left ventricle has hyperdynamic function.  Left ventricular endocardial  border not optimally defined to evaluate regional wall motion. There is  mild concentric left ventricular  hypertrophy. Left ventricular diastolic parameters are indeterminate.   2. Right ventricular systolic function was not well visualized. The right  ventricular size is normal.   3. The mitral valve is normal in structure. Trivial mitral valve  regurgitation. No evidence of mitral stenosis.   4. The aortic valve was not well visualized. There is mild calcification  of the aortic valve. Aortic valve regurgitation is not visualized. No  aortic stenosis is present.   5. The inferior vena cava is normal in size with greater than 50%  respiratory variability, suggesting right atrial pressure of 3 mmHg.    ASSESSMENT & PLAN CHA2DS2-VASc Score = 5  The patient's score is based upon: CHF History: 0 HTN History: 1 Diabetes History: 0 Stroke History: 0 Vascular Disease History: 1 Age Score: 2 Gender Score: 1       ASSESSMENT AND PLAN: Persistent Atrial Fibrillation (ICD10:  I48.19) The patient's CHA2DS2-VASc score is 5, indicating a 7.2% annual risk of stroke.    She is currently in NSR. Qtc stable. Continue amiodarone 100 mg daily.  We discussed ablation as an option for rhythm control in the future if indicated.  Secondary Hypercoagulable State (ICD10:  D68.69) The patient is at significant risk for stroke/thromboembolism based upon her CHA2DS2-VASc Score of 5.  Continue Apixaban (Eliquis).     Follow up Afib clinic prn.   Lake Bells, PA-C  Afib Clinic [facility] [address] [place], [place] [address] [phone]

## 2023-07-23 ENCOUNTER — Encounter: Payer: Self-pay | Admitting: Hematology

## 2023-07-25 ENCOUNTER — Encounter: Payer: Self-pay | Admitting: Cardiology

## 2023-07-25 ENCOUNTER — Telehealth: Payer: Self-pay | Admitting: Cardiology

## 2023-07-25 NOTE — Telephone Encounter (Signed)
-----   Message from Georgiana Spinner sent at 07/24/2023  4:12 PM EST ----- I have requested that the description of this  be changed from [facility] to [place].

## 2023-07-25 NOTE — Progress Notes (Signed)
Contacted pt to let her know: her ferritin and iron sat are not at goals and would recommend IV venofer weekly x 3 doses to built up her iron stores and address her anemia  Pt acknowledged information and verbalized understanding. Pt agreeable.

## 2023-07-29 ENCOUNTER — Other Ambulatory Visit: Payer: Self-pay | Admitting: Hematology

## 2023-07-30 ENCOUNTER — Telehealth: Payer: Self-pay

## 2023-07-30 ENCOUNTER — Encounter: Payer: Self-pay | Admitting: Hematology

## 2023-07-30 NOTE — Telephone Encounter (Signed)
Dr. Candise Che, patient will be scheduled as soon as possible.  `Auth Submission: NO AUTH NEEDED Site of care: Site of care: [facility] Payer: [employer] medicare Medication & CPT/J Code(s) submitted: Venofer (Iron Sucrose) J1756 Route of submission (phone, fax, portal):  Phone # Fax # Auth type: Buy/Bill PB Units/visits requested: 300mg  x 3 doses Reference number:  Approval from: 07/30/23 to 08/28/23

## 2023-08-01 ENCOUNTER — Telehealth: Payer: Self-pay | Admitting: Cardiology

## 2023-08-01 NOTE — Telephone Encounter (Signed)
Results sent to [name] via MyChart. See MyChart comments below:   Ms. Method Your thyroid (TSH) is normal.  Your cholesterol is optimal. Continue current medications/treatment plan and follow up as scheduled. Tereso Newcomer, PA-C   07/25/2023 1:17 PM  Patient notified.

## 2023-08-01 NOTE — Telephone Encounter (Signed)
Patient was returning phone call 

## 2023-08-01 NOTE — Telephone Encounter (Signed)
-----   Message from Tereso Newcomer sent at 07/29/2023  2:02 PM EST ----- Patient did not view MyChart comments.  Please notify patient of results. Tereso Newcomer, PA-C    07/29/2023 2:02 PM

## 2023-08-12 MED ORDER — IRON SUCROSE 300 MG IVPB - SIMPLE MED
300.0000 mg | Freq: Once | Status: AC
Start: 2023-08-13 — End: 2023-08-13
  Administered 2023-08-13: 300 mg via INTRAVENOUS
  Filled 2023-08-12: qty 265

## 2023-08-12 MED ORDER — ACETAMINOPHEN 325 MG PO TABS
650.0000 mg | ORAL_TABLET | Freq: Once | ORAL | Status: AC
  Administered 2023-08-13: 650 mg via ORAL
  Filled 2023-08-12: qty 2

## 2023-08-12 MED ORDER — LORATADINE 10 MG PO TABS
10.0000 mg | ORAL_TABLET | Freq: Once | ORAL | Status: AC
Start: 2023-08-13 — End: 2023-08-13
  Administered 2023-08-13: 10 mg via ORAL
  Filled 2023-08-12: qty 1

## 2023-08-13 ENCOUNTER — Ambulatory Visit: Payer: Medicare HMO

## 2023-08-13 VITALS — BP 126/70 | HR 61 | Temp 98.1°F | Resp 20 | Ht 63.0 in | Wt 188.6 lb

## 2023-08-13 DIAGNOSIS — D509 Iron deficiency anemia, unspecified: Secondary | ICD-10-CM | POA: Diagnosis not present

## 2023-08-13 DIAGNOSIS — D500 Iron deficiency anemia secondary to blood loss (chronic): Secondary | ICD-10-CM

## 2023-08-13 NOTE — Progress Notes (Signed)
Diagnosis: Iron Deficiency Anemia  Provider:  Chilton Greathouse MD  Procedure: IV Infusion  IV Type: Peripheral, IV Location: L Hand  Venofer (Iron Sucrose), Dose: 300 mg  Infusion Start Time: 6213  Infusion Stop Time: 1030  Post Infusion IV Care: Observation period completed and Peripheral IV Discontinued  Discharge: Condition: Good, Destination: Home . AVS Provided  Performed by:  Garnette Czech, RN

## 2023-08-23 ENCOUNTER — Ambulatory Visit: Payer: Medicare HMO

## 2023-08-23 VITALS — BP 118/68 | HR 62 | Temp 97.8°F | Resp 16 | Ht 63.0 in | Wt 189.2 lb

## 2023-08-23 DIAGNOSIS — D509 Iron deficiency anemia, unspecified: Secondary | ICD-10-CM

## 2023-08-23 DIAGNOSIS — D500 Iron deficiency anemia secondary to blood loss (chronic): Secondary | ICD-10-CM

## 2023-08-23 MED ORDER — LORATADINE 10 MG PO TABS
10.0000 mg | ORAL_TABLET | Freq: Every day | ORAL | Status: DC
  Administered 2023-08-23: 10 mg via ORAL
  Filled 2023-08-23: qty 1

## 2023-08-23 MED ORDER — ACETAMINOPHEN 325 MG PO TABS
650.0000 mg | ORAL_TABLET | Freq: Once | ORAL | Status: AC
  Administered 2023-08-23: 650 mg via ORAL
  Filled 2023-08-23: qty 2

## 2023-08-23 MED ORDER — IRON SUCROSE 300 MG IVPB - SIMPLE MED
300.0000 mg | Freq: Once | Status: AC
  Administered 2023-08-23: 300 mg via INTRAVENOUS
  Filled 2023-08-23: qty 265

## 2023-08-23 NOTE — Progress Notes (Signed)
Diagnosis: Iron Deficiency Anemia  Provider:  Chilton Greathouse MD  Procedure: IV Infusion  IV Type: Peripheral, IV Location: L Antecubital  Venofer (Iron Sucrose), Dose: 300 mg  Infusion Start Time: 0850  Infusion Stop Time: 1029  Post Infusion IV Care: Patient declined observation and Peripheral IV Discontinued  Discharge: Condition: Good, Destination: Home . AVS Declined  Performed by:  Rico Ala, LPN

## 2023-08-31 ENCOUNTER — Ambulatory Visit: Payer: Medicare HMO

## 2023-08-31 VITALS — BP 129/77 | HR 57 | Temp 97.8°F | Resp 16 | Ht 63.0 in | Wt 186.4 lb

## 2023-08-31 DIAGNOSIS — D500 Iron deficiency anemia secondary to blood loss (chronic): Secondary | ICD-10-CM

## 2023-08-31 DIAGNOSIS — D509 Iron deficiency anemia, unspecified: Secondary | ICD-10-CM

## 2023-08-31 MED ORDER — ACETAMINOPHEN 325 MG PO TABS
650.0000 mg | ORAL_TABLET | Freq: Once | ORAL | Status: AC
Start: 2023-08-31 — End: 2023-08-31
  Administered 2023-08-31: 650 mg via ORAL
  Filled 2023-08-31: qty 2

## 2023-08-31 MED ORDER — LORATADINE 10 MG PO TABS
10.0000 mg | ORAL_TABLET | Freq: Every day | ORAL | Status: DC
  Administered 2023-08-31: 10 mg via ORAL
  Filled 2023-08-31: qty 1

## 2023-08-31 MED ORDER — IRON SUCROSE 300 MG IVPB - SIMPLE MED
300.0000 mg | Freq: Once | Status: AC
  Administered 2023-08-31: 300 mg via INTRAVENOUS
  Filled 2023-08-31: qty 265

## 2023-08-31 NOTE — Progress Notes (Signed)
 Diagnosis: Iron  Deficiency Anemia  Provider:  Praveen Mannam MD  Procedure: IV Infusion  IV Type: Peripheral, IV Location: R Hand  Venofer  (Iron  Sucrose), Dose: 300 mg  Infusion Start Time: 0902  Infusion Stop Time: 1059  Post Infusion IV Care: Peripheral IV Discontinued  Discharge: Condition: Good, Destination: Home . AVS Declined  Performed by:  Maximiano JONELLE Pouch, LPN

## 2023-09-03 ENCOUNTER — Other Ambulatory Visit: Payer: Self-pay | Admitting: Physician Assistant

## 2023-09-04 ENCOUNTER — Telehealth: Payer: Self-pay | Admitting: Cardiology

## 2023-09-04 MED ORDER — AMIODARONE HCL 200 MG PO TABS: 100.0000 mg | ORAL_TABLET | Freq: Every day | ORAL | 3 refills | Status: DC

## 2023-09-04 NOTE — Telephone Encounter (Signed)
*  STAT* If patient is at the pharmacy, call can be transferred to refill team.   1. Which medications need to be refilled? (please list name of each medication and dose if known) amiodarone  (PACERONE ) 200 MG tablet    2. Would you like to learn more about the convenience, safety, & potential cost savings by using the [facility]?      3. Are you open to using the [facility] (Type [facility]. ).   4. Which pharmacy/location (including street and city if local pharmacy) is medication to be sent to? [id number] [place],  - [address]    5. Do they need a 30 day or 90 day supply? 90   Patient is currently taking:  Take 0.5 tablets (100 mg total) by mouth daily. Patient taking differently:  Take 100 mg by mouth in the morning.

## 2023-09-04 NOTE — Telephone Encounter (Signed)
 Pt's medication was sent to pt's pharmacy as requested. Confirmation received.

## 2023-10-29 NOTE — Progress Notes (Deleted)
 Consider for the Triumph Outcomes study using retatrutide in subjects with chronic kidney disease.

## 2023-11-08 ENCOUNTER — Ambulatory Visit: Payer: Medicare HMO | Admitting: Cardiology

## 2023-12-05 ENCOUNTER — Other Ambulatory Visit: Payer: Self-pay | Admitting: Cardiology

## 2023-12-18 ENCOUNTER — Other Ambulatory Visit: Payer: Self-pay | Admitting: Cardiology

## 2023-12-18 DIAGNOSIS — I480 Paroxysmal atrial fibrillation: Secondary | ICD-10-CM

## 2023-12-18 NOTE — Telephone Encounter (Signed)
 Eliquis  5mg  refill request received. Patient is 78 years old, weight-84.6kg, Crea-1.42 on 07/17/23, Diagnosis-Afib, and last seen by Minnie Amber on 07/19/23. Dose is appropriate based on dosing criteria. Will send in refill to requested pharmacy.

## 2023-12-19 LAB — LAB REPORT - SCANNED: EGFR: 33

## 2023-12-31 NOTE — Progress Notes (Signed)
 Consider for the Triumph Outcomes study with retatrutide for CKD + CAD in patients with BMI > 27.

## 2024-01-08 ENCOUNTER — Ambulatory Visit: Attending: Cardiology | Admitting: Cardiology

## 2024-01-08 ENCOUNTER — Encounter: Payer: Self-pay | Admitting: Cardiology

## 2024-01-08 VITALS — BP 123/63 | HR 84 | Resp 16 | Ht 63.0 in | Wt 192.8 lb

## 2024-01-08 DIAGNOSIS — I10 Essential (primary) hypertension: Secondary | ICD-10-CM

## 2024-01-08 DIAGNOSIS — I4819 Other persistent atrial fibrillation: Secondary | ICD-10-CM | POA: Diagnosis not present

## 2024-01-08 MED ORDER — AMLODIPINE BESYLATE 10 MG PO TABS: 10.0000 mg | ORAL_TABLET | Freq: Every day | ORAL | 3 refills | Status: AC

## 2024-01-08 NOTE — Patient Instructions (Signed)
 Medication Instructions:  Change amlodipine  to 10 mg one tablet by mouth daily  *If you need a refill on your cardiac medications before your next appointment, please call your pharmacy*  Lab Work: none If you have labs (blood work) drawn today and your tests are completely normal, you will receive your results only by: MyChart Message (if you have MyChart) OR A paper copy in the mail If you have any lab test that is abnormal or we need to change your treatment, we will call you to review the results.  Testing/Procedures: Your physician has requested that you have an echocardiogram in 3 months. . Echocardiography is a painless test that uses sound waves to create images of your heart. It provides your doctor with information about the size and shape of your heart and how well your heart's chambers and valves are working. This procedure takes approximately one hour. There are no restrictions for this procedure. Please do NOT wear cologne, perfume, aftershave, or lotions (deodorant is allowed). Please arrive 15 minutes prior to your appointment time.  Please note: We ask at that you not bring children with you during ultrasound (echo/ vascular) testing. Due to room size and safety concerns, children are not allowed in the ultrasound rooms during exams. Our front office staff cannot provide observation of children in our lobby area while testing is being conducted. An adult accompanying a patient to their appointment will only be allowed in the ultrasound room at the discretion of the ultrasound technician under special circumstances. We apologize for any inconvenience.   Follow-Up: At [facility], you and your health needs are our priority.  As part of our continuing mission to provide you with exceptional heart care, our providers are all part of one team.  This team includes your primary Cardiologist (physician) and Advanced Practice Providers or APPs (Physician Assistants and Nurse  Practitioners) who all work together to provide you with the care you need, when you need it.  Your next appointment:   3 month(s)--week or so after echo  Provider:   Knox Perl, MD    We recommend signing up for the patient portal called "MyChart".  Sign up information is provided on this After Visit Summary.  MyChart is used to connect with patients for Virtual Visits (Telemedicine).  Patients are able to view lab/test results, encounter notes, upcoming appointments, etc.  Non-urgent messages can be sent to your provider as well.   To learn more about what you can do with MyChart, go to [email or web address].   Other Instructions

## 2024-01-08 NOTE — Progress Notes (Signed)
 Cardiology Office Note:  .   Date:  01/08/2024  ID:  Amanda Frazier, DOB 1946-08-27, MRN [id number] PCP: Amanda Bertin, MD  [facility] Providers Cardiologist:  Amanda Perl, MD   History of Present Illness: .   Amanda Frazier is a 78 y.o. Caucasian female with hypertension, hyperlipidemia, hyperglycemia, paroxysmal atrial fibrillation, last cardioversion was on 07/13/2023.  Presently on Eliquis  5 mg twice daily and amiodarone  100 mg daily.  Discussed the use of AI scribe software for clinical note transcription with the patient, who gave verbal consent to proceed.  History of Present Illness Amanda Frazier is a 78 year old female with atrial fibrillation who presents for follow-up regarding her heart rhythm management. She was referred by Dr. Alexia Frazier for suspected atrial fibrillation based on an EKG.  She underwent shock therapy for atrial fibrillation on July 12, 2022, but evaluated by PCP and it appears that she was back in A-fib.  She has remained asymptomatic. She does not experience typical symptoms of atrial fibrillation such as a rapid heart rate or weakness. Her pulse is irregular but not fast. She is trying to increase her physical activity by walking three to five times a week.  Labs   Lab Results  Component Value Date   CHOL 160 08/08/2021   HDL 58 08/08/2021   LDLCALC 85 08/08/2021   TRIG 93 08/08/2021   Lab Results  Component Value Date   NA 139 07/17/2023   K 4.3 07/17/2023   CO2 27 07/17/2023   GLUCOSE 111 (H) 07/17/2023   BUN 23 07/17/2023   CREATININE 1.42 (H) 07/17/2023   CALCIUM  9.8 07/17/2023   EGFR 33.0 12/19/2023   GFRNONAA 38 (L) 07/17/2023      Latest Ref Rng & Units 07/17/2023    9:50 AM 07/11/2023    4:06 PM 06/13/2023    5:46 PM  BMP  Glucose 70 - 99 mg/dL 956  98  213   BUN 8 - 23 mg/dL 23  23  22    Creatinine 0.44 - 1.00 mg/dL 0.86  5.78  4.69   BUN/Creat Ratio 12 - 28  15    Sodium 135 - 145 mmol/L 139  141  135    Potassium 3.5 - 5.1 mmol/L 4.3  4.7  4.5   Chloride 98 - 111 mmol/L 107  105  100   CO2 22 - 32 mmol/L 27  22    Calcium  8.9 - 10.3 mg/dL 9.8  9.7        Latest Ref Rng & Units 07/17/2023    9:50 AM 07/11/2023    4:06 PM 06/13/2023    5:46 PM  CBC  WBC 4.0 - 10.5 K/uL 3.2  4.0    Hemoglobin 12.0 - 15.0 g/dL 62.9  9.7  52.8   Hematocrit 36.0 - 46.0 % 32.1  30.8  30.0   Platelets 150 - 400 K/uL 186  234     No results found for: "HGBA1C"  Lab Results  Component Value Date   TSH 2.122 07/19/2021     PCP labs 07/25/2023: Cholesterol 171 <200 mg/dL    CHOL/HDL 2.8 4.1-3.2 Ratio    HDLD 61 30-85 mg/dL Values below 40 mg/dL indicate increased risk factor  Triglyceride 95 0-199 mg/dL    NHDL 440 1-027 mg/dL Range dependent upon risk factors.  LDL Chol Calc (NIH) 93 0-99 mg/dL    TSH   TSH 2.53 6.64-4.03 UlU/mL     ROS  Review of  Systems  Cardiovascular:  Negative for chest pain, dyspnea on exertion and leg swelling.   Physical Exam:   VS:  BP 123/63 (BP Location: Left Arm, Patient Position: Sitting, Cuff Size: Large)   Pulse 84   Resp 16   Ht 5\' 3"  (1.6 m)   Wt 192 lb 12.8 oz (87.5 kg)   SpO2 99%   BMI 34.15 kg/m    Wt Readings from Last 3 Encounters:  01/08/24 192 lb 12.8 oz (87.5 kg)  08/31/23 186 lb 6.4 oz (84.6 kg)  08/23/23 189 lb 3.2 oz (85.8 kg)    Physical Exam Neck:     Vascular: No carotid bruit or JVD.  Cardiovascular:     Rate and Rhythm: Normal rate. Rhythm irregular.     Pulses: Normal pulses and intact distal pulses.     Heart sounds: No murmur heard. Pulmonary:     Effort: Pulmonary effort is normal.     Breath sounds: Normal breath sounds.  Abdominal:     General: Bowel sounds are normal.     Palpations: Abdomen is soft.  Musculoskeletal:     Right lower leg: No edema.     Left lower leg: No edema.  Skin:    Capillary Refill: Capillary refill takes less than 2 seconds.  Studies Reviewed: Amanda Frazier    ECHOCARDIOGRAM COMPLETE 06/14/2023  1.  Left ventricular ejection fraction, by estimation, is 70 to 75%. The left ventricle has hyperdynamic function. Left ventricular endocardial border not optimally defined to evaluate regional wall motion. There is mild concentric left ventricular hypertrophy. Left ventricular diastolic parameters are indeterminate. 2. Right ventricular systolic function was not well visualized. The right ventricular size is normal. 3. The mitral valve is normal in structure. Trivial mitral valve regurgitation. No evidence of mitral stenosis. 4. The aortic valve was not well visualized. There is mild calcification of the aortic valve. Aortic valve regurgitation is not visualized. No aortic stenosis is present. 5. The inferior vena cava is normal in size with greater than 50% respiratory variability, suggesting right atrial pressure of 3 mmHg.  EKG:    EKG Interpretation Date/Time:  Tuesday Jan 08 2024 09:12:23 EDT Ventricular Rate:  70 PR Interval:    QRS Duration:  90 QT Interval:  404 QTC Calculation: 436 R Axis:   -21  Text Interpretation: EKG 01/08/2024: Atrial fibrillation with controlled ventricular response at the rate of 70 bpm, incomplete right bundle branch block.  Compared to 07/19/2023, sinus rhythm has been replaced. Confirmed by Amanda Frazier, Amanda Frazier ([id number]) on 01/08/2024 9:29:04 AM    Medications and allergies    Allergies  Allergen Reactions   Celecoxib Nausea And Vomiting   Fosamax [Alendronate Sodium] Nausea And Vomiting   Lipitor [Atorvastatin Calcium ] Other (See Comments)    Fatigue   Valsartan-Hydrochlorothiazide  Cough   Benazepril Cough   Nickel Other (See Comments)    Positive patch test   Palladium Chloride Other (See Comments)    Positive patch test   Simvastatin Diarrhea   Valsartan     Other reaction(s): Cough     Current Outpatient Medications:    acetaminophen  (TYLENOL ) 325 MG tablet, Take 1-2 tablets (325-650 mg total) by mouth every 6 (six) hours as needed for mild pain  (pain score 1-3) (pain score 1-3 or temp > 100.5)., Disp: 60 tablet, Rfl: 1   amiodarone  (PACERONE ) 200 MG tablet, Take 0.5 tablets (100 mg total) by mouth daily., Disp: 45 tablet, Rfl: 3   amLODipine  (NORVASC ) 5 MG tablet, Take 2  tablets (10 mg total) by mouth daily., Disp: 180 tablet, Rfl: 3   B Complex Vitamins (B-COMPLEX/B-12) LIQD, Place 1 mL under the tongue in the morning., Disp: , Rfl:    calcium  carbonate (TUMS - DOSED IN MG ELEMENTAL CALCIUM ) 500 MG chewable tablet, Chew 2 tablets by mouth daily., Disp: , Rfl:    carboxymethylcellulose (REFRESH TEARS) 0.5 % SOLN, Place 1-2 drops into both eyes 3 (three) times daily as needed (dry/irritated eyes.)., Disp: , Rfl:    Cholecalciferol (VITAMIN D3) 125 MCG (5000 UT) CAPS, Take 5,000 Units by mouth in the morning., Disp: , Rfl:    ELIQUIS  5 MG TABS tablet, TAKE 1 TABLET BY MOUTH TWICE A DAY, Disp: 180 tablet, Rfl: 1   famotidine  (PEPCID ) 20 MG tablet, Take 20 mg by mouth 2 (two) times daily., Disp: , Rfl:    losartan  (COZAAR ) 50 MG tablet, Take 50 mg by mouth at bedtime., Disp: , Rfl:    rosuvastatin  (CRESTOR ) 20 MG tablet, TAKE 1 TABLET BY MOUTH EVERY DAY, Disp: 90 tablet, Rfl: 2   No orders of the defined types were placed in this encounter.    There are no discontinued medications.   ASSESSMENT AND PLAN: .      ICD-10-CM   1. Persistent atrial fibrillation (HCC)  I48.19 EKG 12-Lead    2. Essential hypertension  I10      Assessment & Plan Distant atrial fibrillation   Chronic atrial fibrillation is asymptomatic with a well-controlled heart rate. She has been in AFib for six months without symptoms. Cardioversion is not indicated due to stable heart function. Cardioversion was discussed for potential future symptomatic relief, heart function decline, or heart failure symptoms. Current management will continue. Continue amiodarone  100 mg daily. Order repeat EKG in three months and an echocardiogram to assess heart function.  Unless the  LVEF has decreased or she develops symptomatic A-fib, we could continue to pursue rate control strategy.  We can consider addition of beta-blocker and potentially dihydropyridine calcium  blocker to improve her heart rate  Hypertension   Hypertension is well-controlled with losartan  50 mg once daily and amlodipine  5 mg twice daily. Simplify the regimen by changing amlodipine  to 10 mg once daily. Continue losartan  50 mg once daily.  Gastroesophageal reflux disease (GERD)   GERD symptoms worsened after stopping Prilosec. Sucralfate  was ineffective. Manage symptoms with dietary modifications, including 1% milk intake.  Gastric polyps   Gastric polyps, previously bleeding, were removed in September. Discontinue Prilosec as advised by the gastroenterologist.  Office visit in 6 months and follow-up of echocardiogram.     Signed,  Amanda Perl, MD, [facility] 01/08/2024, 9:29 AM [facility] [address] [place], [place] [address] Phone: [phone]. Fax:  [phone]

## 2024-01-17 ENCOUNTER — Ambulatory Visit: Payer: Medicare HMO | Admitting: Cardiology

## 2024-01-20 IMAGING — US US RENAL
1 series · 14 of 25 positions shown · non-contrast
Comparison: None.

CLINICAL DATA: History of hypertension, chronic kidney disease.

EXAM:
RENAL / URINARY TRACT ULTRASOUND COMPLETE

[Series 1: us renal · 0.23mm/px · 14 of 39 slices shown]
[im 1/39]
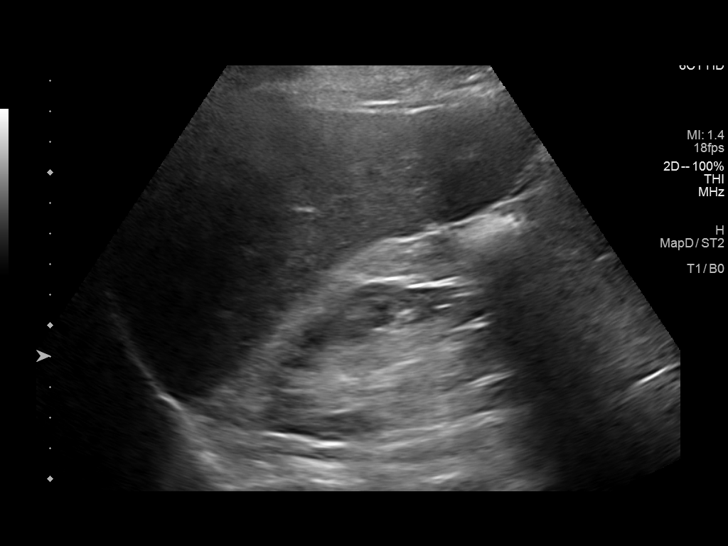
[im 4/39]
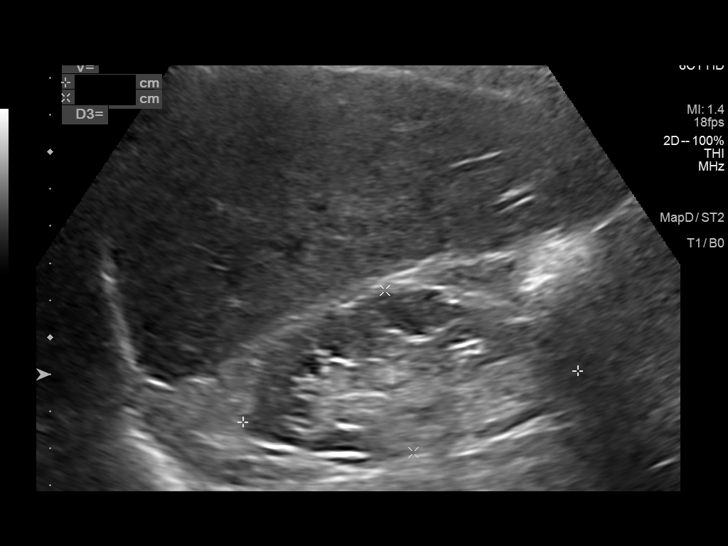
[im 7/39]
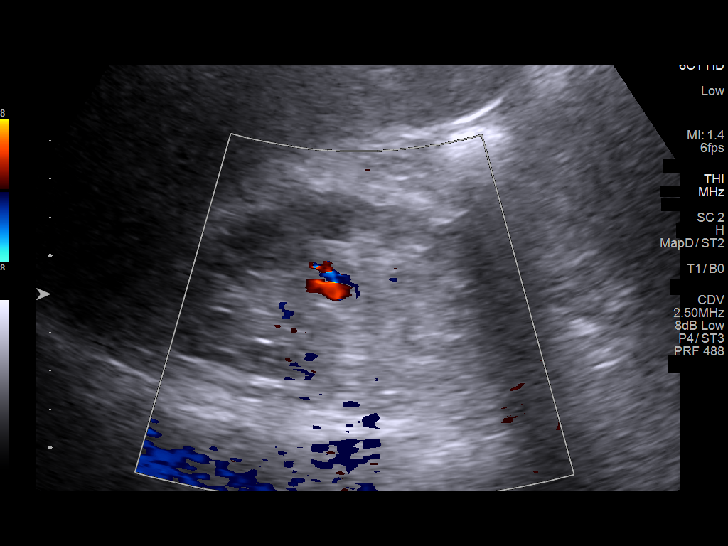
[im 10/39]
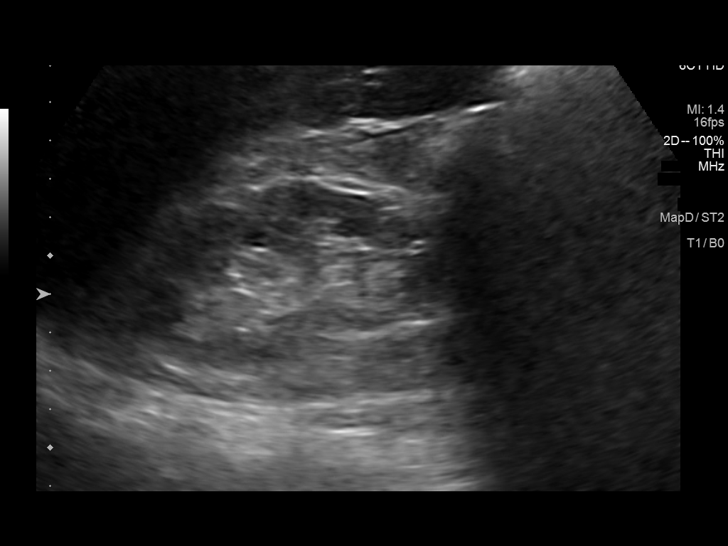
[im 13/39]
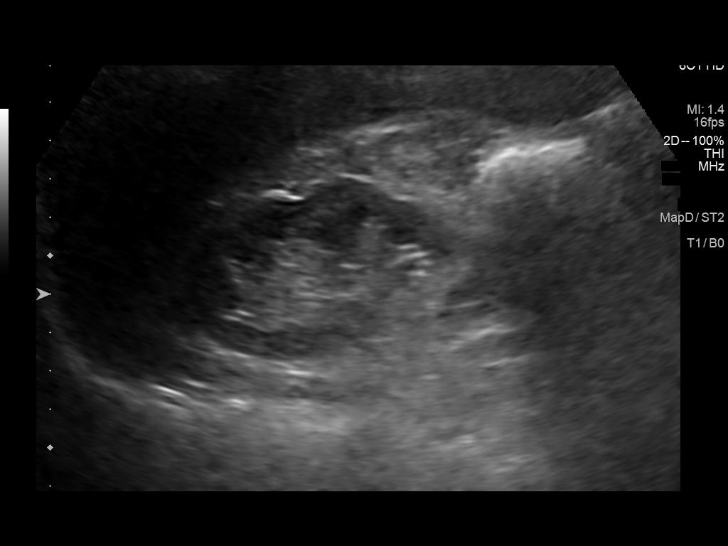
[im 15/39]
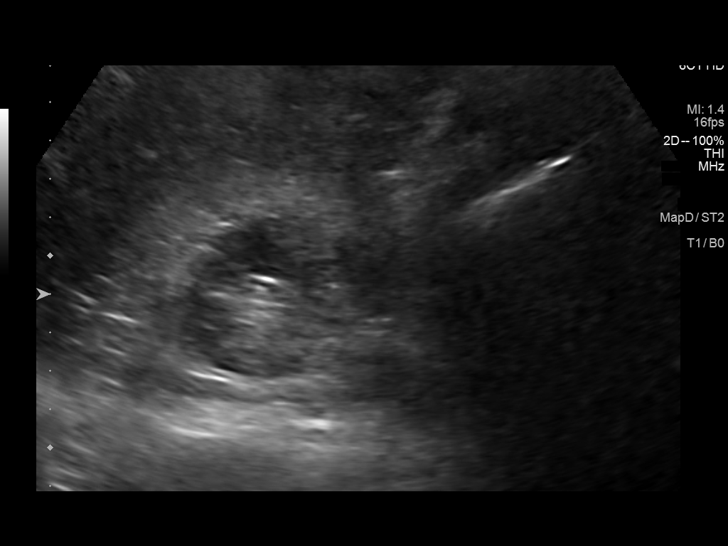
[im 18/39]
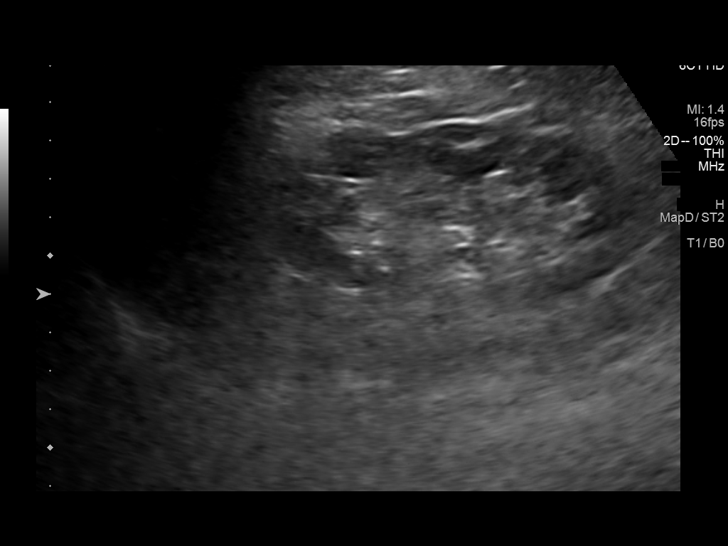
[im 21/39]
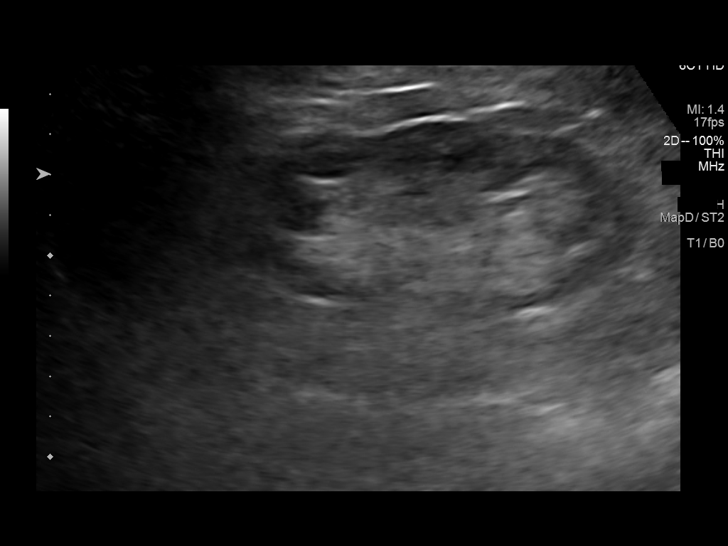
[im 24/39]
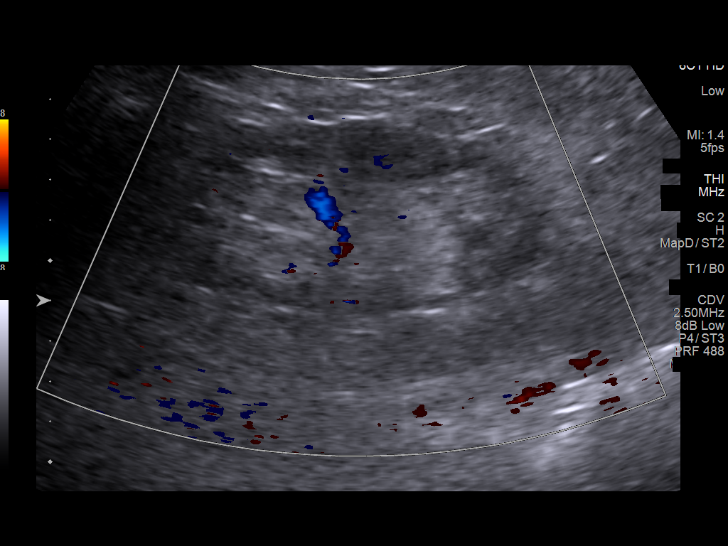
[im 26/39]
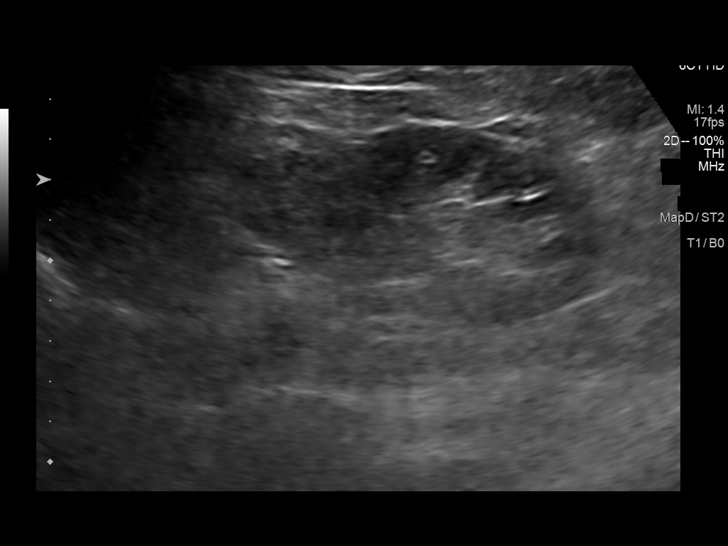
[im 29/39]
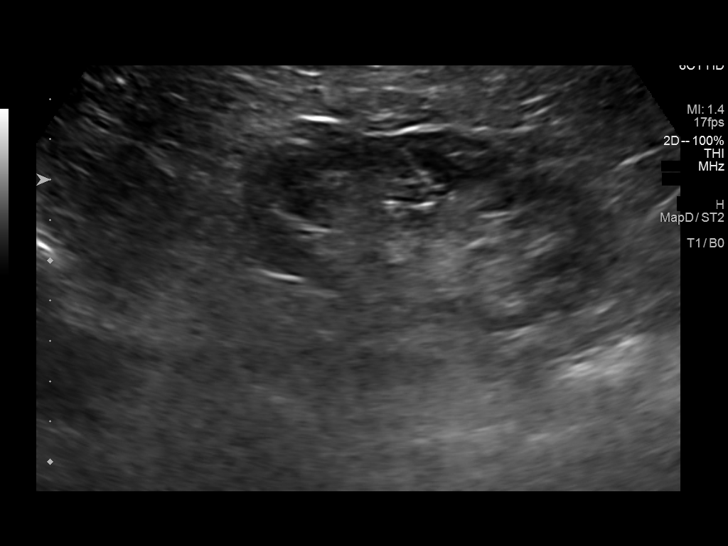
[im 32/39]
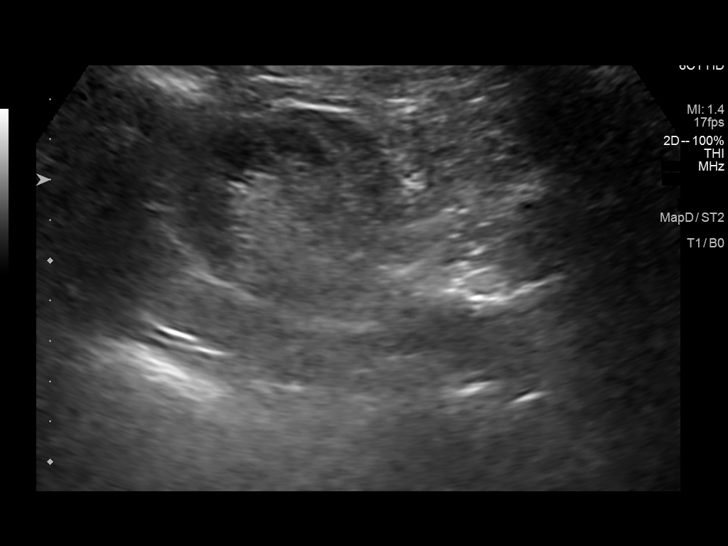
[im 35/39]
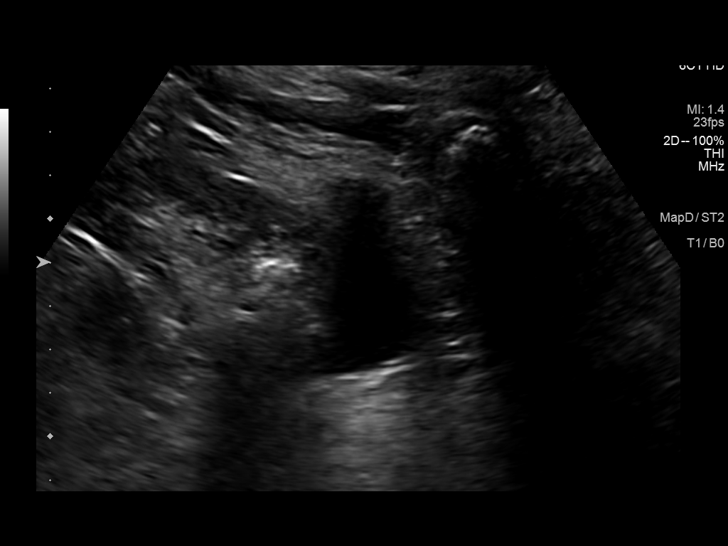
[im 39/39]
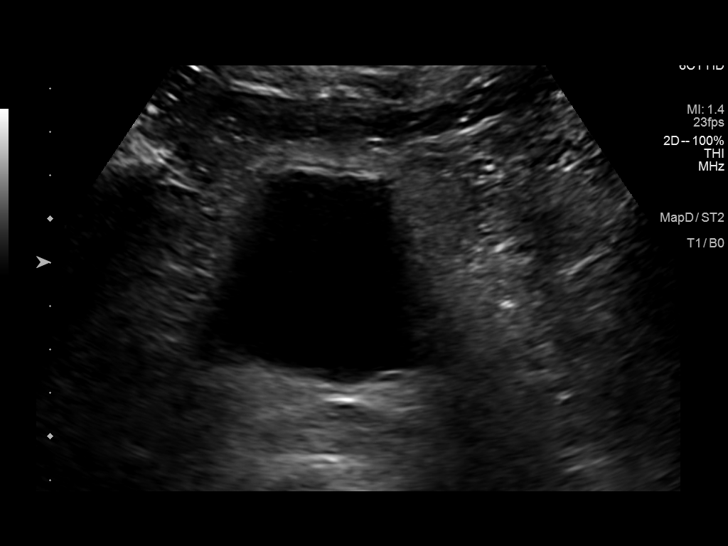

[14 of 25 positions shown; findings below may reference images not displayed]

FINDINGS: Right Kidney:

Renal measurements: 9.1 x 4.4 x 4.7 cm = volume: 99 mL. Echogenicity
within normal limits but with mild general cortical thinning. No
mass, stones or hydronephrosis visualized.

Left Kidney:

Renal measurements: 10.2 x 4.4 x 4.2 cm = volume: 97 mL.
Echogenicity within normal limits, but with mild general cortical
thinning. No mass, stones or hydronephrosis visualized.

Bladder:

There is mild thickening of the bladder wall versus changes of
underdistention.

Other:

No free fluid is evident.
IMPRESSION: Mild bilateral cortical thinning without increased cortical
echogenicity. There is also mild bladder thickening versus changes
of nondistention.

## 2024-03-07 ENCOUNTER — Other Ambulatory Visit: Payer: Self-pay | Admitting: Cardiology

## 2024-04-09 ENCOUNTER — Ambulatory Visit (HOSPITAL_COMMUNITY)
Admission: RE | Admit: 2024-04-09 | Discharge: 2024-04-09 | Disposition: A | Discharge status: Home / Self Care | Source: Ambulatory Visit | Attending: Cardiology | Admitting: Cardiology

## 2024-04-09 DIAGNOSIS — I4819 Other persistent atrial fibrillation: Secondary | ICD-10-CM | POA: Diagnosis present

## 2024-04-09 LAB — ECHOCARDIOGRAM COMPLETE
Area-P 1/2: 4.19 cm2
S' Lateral: 3 cm

## 2024-04-14 ENCOUNTER — Ambulatory Visit: Payer: Self-pay | Admitting: Cardiology

## 2024-04-14 NOTE — Progress Notes (Signed)
 Essentially normal echocardiogram. Mild MR.  MR= Mitral regurgitation. (leakage on the left heart valve)  Similarly TR means tricuspid regurgitation (leakage on the right heart valve). Mild MR or TR are probably normal variants unless physicians feel it is abnormal. Reassure.

## 2024-04-17 ENCOUNTER — Ambulatory Visit: Admitting: Cardiology

## 2024-04-22 ENCOUNTER — Ambulatory Visit: Attending: Cardiology | Admitting: Cardiology

## 2024-04-22 ENCOUNTER — Encounter: Payer: Self-pay | Admitting: Cardiology

## 2024-04-22 VITALS — BP 132/64 | HR 82 | Resp 16 | Ht 63.0 in | Wt 194.2 lb

## 2024-04-22 DIAGNOSIS — I4821 Permanent atrial fibrillation: Secondary | ICD-10-CM | POA: Diagnosis not present

## 2024-04-22 DIAGNOSIS — N1832 Chronic kidney disease, stage 3b: Secondary | ICD-10-CM

## 2024-04-22 DIAGNOSIS — Z5181 Encounter for therapeutic drug level monitoring: Secondary | ICD-10-CM | POA: Diagnosis not present

## 2024-04-22 DIAGNOSIS — I10 Essential (primary) hypertension: Secondary | ICD-10-CM

## 2024-04-22 DIAGNOSIS — Z79899 Other long term (current) drug therapy: Secondary | ICD-10-CM

## 2024-04-22 LAB — CBC

## 2024-04-22 MED ORDER — AMIODARONE HCL 200 MG PO TABS: ORAL_TABLET | ORAL | Status: DC

## 2024-04-22 NOTE — Patient Instructions (Signed)
 Medication Instructions:  Decrease amiodarone  to 100 mg by mouth Monday through Friday for one month.  After one month decrease to 100 mg by mouth every other day *If you need a refill on your cardiac medications before your next appointment, please call your pharmacy*  Lab Work: Have lab work done in the lab on the first floor today--CBC, BMP, TSH If you have labs (blood work) drawn today and your tests are completely normal, you will receive your results only by: MyChart Message (if you have MyChart) OR A paper copy in the mail If you have any lab test that is abnormal or we need to change your treatment, we will call you to review the results.  Testing/Procedures: none  Follow-Up: At [facility], you and your health needs are our priority.  As part of our continuing mission to provide you with exceptional heart care, our providers are all part of one team.  This team includes your primary Cardiologist (physician) and Advanced Practice Providers or APPs (Physician Assistants and Nurse Practitioners) who all work together to provide you with the care you need, when you need it.  Your next appointment:   12 month(s)  Provider:   Dr Ladona    We recommend signing up for the patient portal called MyChart.  Sign up information is provided on this After Visit Summary.  MyChart is used to connect with patients for Virtual Visits (Telemedicine).  Patients are able to view lab/test results, encounter notes, upcoming appointments, etc.  Non-urgent messages can be sent to your provider as well.   To learn more about what you can do with MyChart, go to [email or web address].

## 2024-04-22 NOTE — Progress Notes (Signed)
 Cardiology Office Note:  .   Date:  04/22/2024  ID:  Amanda Frazier, DOB 1945-09-08, MRN [id number] PCP: Verena Mems, MD  [facility] Providers Cardiologist:  Gordy Bergamo, MD   History of Present Illness: .   Amanda Frazier is a 78 y.o. Caucasian female with hypertension, hyperlipidemia, hyperglycemia, paroxysmal atrial fibrillation, last cardioversion was on 07/13/2023. Presently on Eliquis  5 mg twice daily and amiodarone  100 mg daily.  She has had a negative nuclear stress test on 10/27/2019 revealing breast tissue activation in the inferior wall with normal LVEF.  Due to persistent atrial fibrillation 3 months ago, has been in A-fib for almost 9 to 10 months, it is felt that rate control strategy would be appropriate and underwent echocardiogram on 04/09/2024 again revealing preserved LVEF.    She now presents for follow-up.  She remains asymptomatic.  States her since being on present medical therapy, she has not had any heart racing symptoms.  Cardiac Studies relevent.    ECHOCARDIOGRAM COMPLETE 04/09/2024  1. Left ventricular ejection fraction, by estimation, is 55-65%. The left ventricle has normal function. The left ventricle has no regional wall motion abnormalities. Left ventricular diastolic function could not be evaluated. 2. Right ventricular systolic function is normal. The right ventricular size is normal. 3. Left atrial size was severely dilated. 4. The mitral valve is normal in structure. Mild mitral valve regurgitation. No evidence of mitral stenosis.    Discussed the use of AI scribe software for clinical note transcription with the patient, who gave verbal consent to proceed.  History of Present Illness Amanda Frazier is a 78 year old female with atrial fibrillation who presents for follow-up of her heart condition.  Her heart rate remains well controlled on amiodarone , ranging from the low sixties to mid-seventies, even with activity. Previously, her  heart rate reached 140 bpm, necessitating emergency room visits for uncontrolled atrial fibrillation and fluctuating blood pressure, with the last visit in September of last year. She tolerates amiodarone  well and performs all activities without tachycardia. She is on Eliquis  5 mg twice daily. Her blood pressure is stable and well controlled. She has stable stage 3B kidney disease. No leg swelling is present, and she generally feels well, except for occasional chills.   Labs   Recent Labs    06/05/23 0822 06/13/23 1651 06/13/23 1746 07/11/23 1606 07/17/23 0950  NA 139 134* 135 141 139  K 4.6 4.5 4.5 4.7 4.3  CL 108 99 100 105 107  CO2 24 24  --  22 27  GLUCOSE 104* 132* 126* 98 111*  BUN 22 23 22 23 23   CREATININE 1.42* 1.35* 1.30* 1.56* 1.42*  CALCIUM  9.4 9.2  --  9.7 9.8  GFRNONAA 38* 40*  --   --  38*    Lab Results  Component Value Date   ALT 8 07/17/2023   AST 16 07/17/2023   ALKPHOS 68 07/17/2023   BILITOT 1.0 07/17/2023      Latest Ref Rng & Units 07/17/2023    9:50 AM 07/11/2023    4:06 PM 06/13/2023    5:46 PM  CBC  WBC 4.0 - 10.5 K/uL 3.2  4.0    Hemoglobin 12.0 - 15.0 g/dL 89.7  9.7  89.7   Hematocrit 36.0 - 46.0 % 32.1  30.8  30.0   Platelets 150 - 400 K/uL 186  234     No results found for: HGBA1C  Lab Results  Component Value Date  TSH 2.122 07/19/2021     ROS  Review of Systems  Cardiovascular:  Negative for chest pain, dyspnea on exertion and leg swelling.   Physical Exam:   VS:  BP 132/64 (BP Location: Left Arm, Patient Position: Sitting, Cuff Size: Normal)   Pulse 82   Resp 16   Ht 5' 3 (1.6 m)   Wt 194 lb 3.2 oz (88.1 kg)   SpO2 98%   BMI 34.40 kg/m    Wt Readings from Last 3 Encounters:  04/22/24 194 lb 3.2 oz (88.1 kg)  01/08/24 192 lb 12.8 oz (87.5 kg)  08/31/23 186 lb 6.4 oz (84.6 kg)    BP Readings from Last 3 Encounters:  04/22/24 132/64  01/08/24 123/63  08/31/23 129/77   Physical Exam Neck:     Vascular: No carotid  bruit or JVD.  Cardiovascular:     Rate and Rhythm: Normal rate. Rhythm irregular.     Pulses: Normal pulses and intact distal pulses.     Heart sounds: No murmur heard. Pulmonary:     Effort: Pulmonary effort is normal.     Breath sounds: Normal breath sounds.  Abdominal:     General: Bowel sounds are normal.     Palpations: Abdomen is soft.  Musculoskeletal:     Right lower leg: No edema.     Left lower leg: No edema.  Skin:    Capillary Refill: Capillary refill takes less than 2 seconds.    EKG:    EKG Interpretation Date/Time:  Tuesday April 22 2024 08:29:30 EDT Ventricular Rate:  60 PR Interval:    QRS Duration:  96 QT Interval:  438 QTC Calculation: 438 R Axis:   -14  Text Interpretation: EKG 04/23/2023: Atrial fibrillation with controlled ventricular response at the rate of 60 bpm.  Incomplete right bundle branch block.  Nonspecific T abnormality.  Compared to 01/08/2024, no significant change. Confirmed by Jaidynn Balster, Jagadeesh ([id number]) on 04/22/2024 11:07:02 AM    ASSESSMENT AND PLAN: .      ICD-10-CM   1. Permanent atrial fibrillation (HCC)  I48.21 EKG 12-Lead    CBC    Basic Metabolic Panel (BMET)    TSH    2. Essential hypertension  I10 Basic Metabolic Panel (BMET)    3. Stage 3b chronic kidney disease (HCC)  N18.32 Basic Metabolic Panel (BMET)    4. Encounter for monitoring amiodarone  therapy  Z51.81 TSH   Z79.899      Assessment & Plan Chronic atrial fibrillation with controlled ventricular response Chronic atrial fibrillation with well-controlled ventricular response. Heart rate stable in the low sixties to mid-seventies with current amiodarone  regimen. No recent tachycardia or emergency room visits since September last year. Echocardiogram shows normal heart function. Asymptomatic and able to perform daily activities without limitations. Continued amiodarone  necessary, but dose reduction considered to minimize potential side effects such as pulmonary and  thyroid  issues. She is aware she will remain in AFib and cardioversion is not indicated due to stable condition and previous unsuccessful attempt. - Continue amiodarone  100 mg five days a week for one month. - Reduce amiodarone  to every other day starting in October if heart rate remains controlled. - Monitor heart rate and symptoms; if heart rate increases, revert to five days a week dosing. - Continue Eliquis  5 mg twice a day. - Order CBC, BMP, and TSH to monitor for potential side effects of amiodarone  and adjust Eliquis  dosing if necessary.  Chronic kidney disease stage 3b Chronic kidney disease stage 3b,  well-managed with no acute changes. Kidney function adequate for her age and expected to remain stable. Monitoring necessary due to Eliquis  use, which may require dose adjustment based on kidney function. - Order blood work to monitor kidney function and adjust Eliquis  dosing if necessary.   Follow up: 6 months and if stable could consider releasing her to PCP with PRN follow up with us  as she will hopefully be on minimal amiodarone  dose with rare chances of toxicity. Could also consider changing Amlodipine  to Dilt for AF rate control and stop Amio.   Signed,  Gordy Bergamo, MD, [facility] 04/22/2024, 11:07 AM [facility] [address] [place], [place] [address] Phone: [phone]. Fax:  [phone]

## 2024-04-23 ENCOUNTER — Ambulatory Visit: Payer: Self-pay | Admitting: Cardiology

## 2024-04-23 LAB — CBC
Hematocrit: 34.9 (ref 34.0–46.6)
Hemoglobin: 11.4 g/dL (ref 11.1–15.9)
MCH: 29.2 pg (ref 26.6–33.0)
MCHC: 32.7 g/dL (ref 31.5–35.7)
MCV: 90 fL (ref 79–97)
Platelets: 195 x10E3/uL (ref 150–450)
RBC: 3.9 x10E6/uL (ref 3.77–5.28)
RDW: 13 (ref 11.7–15.4)
WBC: 5 x10E3/uL (ref 3.4–10.8)

## 2024-04-23 LAB — BASIC METABOLIC PANEL WITH GFR
BUN/Creatinine Ratio: 21 (ref 12–28)
BUN: 30 mg/dL — ABNORMAL HIGH (ref 8–27)
CO2: 21 mmol/L (ref 20–29)
Calcium: 9.6 mg/dL (ref 8.7–10.3)
Chloride: 105 mmol/L (ref 96–106)
Creatinine, Ser: 1.44 mg/dL — ABNORMAL HIGH (ref 0.57–1.00)
Glucose: 103 mg/dL — ABNORMAL HIGH (ref 70–99)
Potassium: 4.4 mmol/L (ref 3.5–5.2)
Sodium: 140 mmol/L (ref 134–144)
eGFR: 37 mL/min/1.73 — ABNORMAL LOW (ref >59)

## 2024-04-23 LAB — TSH: TSH: 4.37 u[IU]/mL (ref 0.450–4.500)

## 2024-04-23 NOTE — Progress Notes (Signed)
 NOrmal CBC. S. Cr < 1.6 and hence on appropriate Eliquis  dose 5 mg BID. No change in therapy for now

## 2024-04-23 NOTE — Progress Notes (Signed)
 Thyroid  function test normal as well.

## 2024-06-13 ENCOUNTER — Other Ambulatory Visit: Payer: Self-pay | Admitting: Internal Medicine

## 2024-06-13 DIAGNOSIS — I480 Paroxysmal atrial fibrillation: Secondary | ICD-10-CM

## 2024-07-15 ENCOUNTER — Other Ambulatory Visit: Payer: Self-pay | Admitting: Internal Medicine

## 2024-07-15 ENCOUNTER — Other Ambulatory Visit: Payer: Self-pay

## 2024-07-15 DIAGNOSIS — I480 Paroxysmal atrial fibrillation: Secondary | ICD-10-CM

## 2024-07-15 DIAGNOSIS — D500 Iron deficiency anemia secondary to blood loss (chronic): Secondary | ICD-10-CM

## 2024-07-16 ENCOUNTER — Inpatient Hospital Stay: Payer: Medicare HMO | Admitting: Hematology

## 2024-07-16 ENCOUNTER — Inpatient Hospital Stay: Payer: Medicare HMO | Attending: Hematology

## 2024-07-16 VITALS — BP 136/54 | HR 75 | Temp 97.7°F | Resp 20 | Wt 197.3 lb

## 2024-07-16 DIAGNOSIS — Z79899 Other long term (current) drug therapy: Secondary | ICD-10-CM | POA: Diagnosis not present

## 2024-07-16 DIAGNOSIS — N183 Chronic kidney disease, stage 3 unspecified: Secondary | ICD-10-CM | POA: Insufficient documentation

## 2024-07-16 DIAGNOSIS — Z8711 Personal history of peptic ulcer disease: Secondary | ICD-10-CM | POA: Diagnosis not present

## 2024-07-16 DIAGNOSIS — I4891 Unspecified atrial fibrillation: Secondary | ICD-10-CM | POA: Insufficient documentation

## 2024-07-16 DIAGNOSIS — Z800 Family history of malignant neoplasm of digestive organs: Secondary | ICD-10-CM | POA: Insufficient documentation

## 2024-07-16 DIAGNOSIS — E538 Deficiency of other specified B group vitamins: Secondary | ICD-10-CM

## 2024-07-16 DIAGNOSIS — Z803 Family history of malignant neoplasm of breast: Secondary | ICD-10-CM | POA: Diagnosis not present

## 2024-07-16 DIAGNOSIS — Z801 Family history of malignant neoplasm of trachea, bronchus and lung: Secondary | ICD-10-CM | POA: Insufficient documentation

## 2024-07-16 DIAGNOSIS — Z7901 Long term (current) use of anticoagulants: Secondary | ICD-10-CM | POA: Insufficient documentation

## 2024-07-16 DIAGNOSIS — I5032 Chronic diastolic (congestive) heart failure: Secondary | ICD-10-CM | POA: Insufficient documentation

## 2024-07-16 DIAGNOSIS — E785 Hyperlipidemia, unspecified: Secondary | ICD-10-CM | POA: Insufficient documentation

## 2024-07-16 DIAGNOSIS — I130 Hypertensive heart and chronic kidney disease with heart failure and stage 1 through stage 4 chronic kidney disease, or unspecified chronic kidney disease: Secondary | ICD-10-CM | POA: Insufficient documentation

## 2024-07-16 DIAGNOSIS — Z806 Family history of leukemia: Secondary | ICD-10-CM | POA: Insufficient documentation

## 2024-07-16 DIAGNOSIS — Z83438 Family history of other disorder of lipoprotein metabolism and other lipidemia: Secondary | ICD-10-CM | POA: Diagnosis not present

## 2024-07-16 DIAGNOSIS — D500 Iron deficiency anemia secondary to blood loss (chronic): Secondary | ICD-10-CM

## 2024-07-16 DIAGNOSIS — D509 Iron deficiency anemia, unspecified: Secondary | ICD-10-CM | POA: Insufficient documentation

## 2024-07-16 LAB — VITAMIN B12: Vitamin B-12: 1005 pg/mL — ABNORMAL HIGH (ref 180–914)

## 2024-07-16 LAB — CBC WITH DIFFERENTIAL (CANCER CENTER ONLY)
Abs Immature Granulocytes: 0.01 K/uL (ref 0.00–0.07)
Basophils Absolute: 0 K/uL (ref 0.0–0.1)
Basophils Relative: 0 %
Eosinophils Absolute: 0 K/uL (ref 0.0–0.5)
Eosinophils Relative: 1 %
HCT: 33.7 % — ABNORMAL LOW (ref 36.0–46.0)
Hemoglobin: 10.8 g/dL — ABNORMAL LOW (ref 12.0–15.0)
Immature Granulocytes: 0 %
Lymphocytes Relative: 16 %
Lymphs Abs: 0.9 K/uL (ref 0.7–4.0)
MCH: 29.1 pg (ref 26.0–34.0)
MCHC: 32 g/dL (ref 30.0–36.0)
MCV: 90.8 fL (ref 80.0–100.0)
Monocytes Absolute: 0.6 K/uL (ref 0.1–1.0)
Monocytes Relative: 10 %
Neutro Abs: 4.2 K/uL (ref 1.7–7.7)
Neutrophils Relative %: 73 %
Platelet Count: 176 K/uL (ref 150–400)
RBC: 3.71 MIL/uL — ABNORMAL LOW (ref 3.87–5.11)
RDW: 14.7 % (ref 11.5–15.5)
WBC Count: 5.7 K/uL (ref 4.0–10.5)
nRBC: 0 % (ref 0.0–0.2)

## 2024-07-16 LAB — CMP (CANCER CENTER ONLY)
ALT: 27 U/L (ref 0–44)
AST: 36 U/L (ref 15–41)
Albumin: 4.2 g/dL (ref 3.5–5.0)
Alkaline Phosphatase: 84 U/L (ref 38–126)
Anion gap: 10 (ref 5–15)
BUN: 35 mg/dL — ABNORMAL HIGH (ref 8–23)
CO2: 25 mmol/L (ref 22–32)
Calcium: 9.7 mg/dL (ref 8.9–10.3)
Chloride: 106 mmol/L (ref 98–111)
Creatinine: 1.38 mg/dL — ABNORMAL HIGH (ref 0.44–1.00)
GFR, Estimated: 39 mL/min — ABNORMAL LOW (ref >60)
Glucose, Bld: 107 mg/dL — ABNORMAL HIGH (ref 70–99)
Potassium: 4.6 mmol/L (ref 3.5–5.1)
Sodium: 142 mmol/L (ref 135–145)
Total Bilirubin: 1 mg/dL (ref 0.0–1.2)
Total Protein: 7.4 g/dL (ref 6.5–8.1)

## 2024-07-16 LAB — FERRITIN: Ferritin: 425 ng/mL — ABNORMAL HIGH (ref 11–307)

## 2024-07-16 LAB — IRON AND IRON BINDING CAPACITY (CC-WL,HP ONLY)
Iron: 48 ug/dL (ref 28–170)
Saturation Ratios: 16 % (ref 10.4–31.8)
TIBC: 291 ug/dL (ref 250–450)
UIBC: 243 ug/dL

## 2024-07-16 NOTE — Progress Notes (Signed)
 HEMATOLOGY ONCOLOGY PROGRESS NOTE  Date of service: 07/16/2024  Patient Care Team: Verena Mems, MD as PCP - General (Family Medicine) Ladona Heinz, MD as PCP - Cardiology (Cardiology)  CHIEF COMPLAINT/PURPOSE OF CONSULTATION: Follow-up for continued evaluation and management of Iron  Deficiency Anemia  HISTORY OF PRESENTING ILLNESS: (07/19/2021) Amanda Frazier is a wonderful 78 y.o. female who has been referred to us  by Dr .Verena Mems, MD for evaluation and management of Anemia.   Patient notes she has a history of hypertension, dyslipidemia, atrial fibrillation on Eliquis , history of gastric ulcer as a teenager and has been on Prilosec for more than 30 years. Patient notes that she got Covid in 02/2021 and had developed anemia and CHF and was put on iron  .   In October Oct 2022 -- labs with her primary care physician showed hgb 8.3 ? GI bleeding no FOBT Dr Dianna - EGD and COlonoscopy -- gastric polyp - bleeding..  Patient notes that Dr. Dianna is trying to set her up for specialty referral to an academic center to help address her remaining gastric polyps.  Helicobacter pylori testing was negative. Patient notes no hematuria, no epistaxis, no gum bleeding. No blood in the urine.  No other acute new symptoms. Omeprazole >20 yrs which could also decrease absorption of iron  from the food and oral replacement. Patient notes no melena or hematemesis .   Patient emotional fevers chills night sweats or unexpected weight loss.  INTERVAL HISTORY:  Amanda Frazier is a 78 y.o. female who is here today for continued evaluation and management of Iron  Deficiency Anemia.   she was last seen by me on 07/17/2023; at the time she did not have any concerns and was doing well.   Today, she says that she has been well and does not have any concerns today. Reports that she's been stable at various visits: Nephrology and Cardiology. She does endorse some GI issues attributed to GERD, but  she has been discontinued from Prilosec due to polyps, and place instead on Pepcid  which reportedly doesn't work as well.  Denies any abdominal pain or bleeding issues (nose bleeds, gum bleeds, abnormal/spontaneous bruising). She does still take Eliquis  for her Afib, and has not had any bleeding concerns.  For the holidays, she will be seeing her children, grand-children, and great-grandchildren in [place] . She notes expecting two new great-grandchildren.   REVIEW OF SYSTEMS:   10 Point review of systems of done and is negative except as noted above.  MEDICAL HISTORY Past Medical History:  Diagnosis Date   (HFpEF) heart failure with preserved ejection fraction (HCC) 05/10/2020   TTE 07/29/2020: EF 50-55, GR 1 DD, moderate LAE, moderate MR, PASP 44  TTE 06/14/2023: EF 70-75, mild LVH, trivial MR, mild AV calcification, RAP 3     Aortic atherosclerosis    Arthritis    Atrial fibrillation (HCC)    a.) CHA2DS2-VASc = 6 (age x2, sex, CHF, HTN, vascular disease history) as of 06/07/2023; b.) s/p DCCV at [facility] 05/12/2020 (200 J x 1, 360 J x1) --> NSR; c.) s/p DCCV at [facility] 05/18/2020 (150 J x 1, 200 J x1) --> NSR; d.) cardiac rate/rhythm maintained on oral amiodarone ; chronically anticoagulated using apixaban    CAD (coronary artery disease)    CHF (congestive heart failure) (HCC)    a.) TTE 10/27/2019: EF 55%, inf/inferolat breast attenuation; b.) TTE 11/05/2019: EF 50-55%, no RWMAs, G1DD, mild MR/PR; c.) TTE 07/29/2020: EF 50-55%, mild LVH, G1DD, mod LAE, mod MR, mild-mod TR, PASP  44   CKD (chronic kidney disease) stage 3, GFR 30-59 ml/min (HCC)    Gastric polyps    GERD (gastroesophageal reflux disease)    History of 2019 novel coronavirus disease (COVID-19) 03/30/2020   History of bilateral cataract extraction    Hyperlipidemia    Hypertension    IDA (iron  deficiency anemia)    Long term current use of amiodarone     On apixaban  therapy    Pulmonary hypertension (HCC) 06/07/2020   a.) CT  chest 06/07/2020: dilated main PA suggesting pHTN; b.)TTE 07/29/2020: PASP 44 mmHg   SNHL (sensorineural hearing loss)    Systolic murmur     SURGICAL HISTORY Past Surgical History:  Procedure Laterality Date   BIOPSY  07/12/2021   Procedure: BIOPSY;  Surgeon: Dianna Specking, MD;  Location: [facility];  Service: Endoscopy;;   CARDIOVERSION N/A 05/12/2020   Procedure: CARDIOVERSION; Location: [facility]; Surgeon: Arley Pun, MD   CARDIOVERSION N/A 05/18/2020   Procedure: CARDIOVERSION; Location: [facility]; Surgeon: Youlanda Juba, MD   CARDIOVERSION N/A 07/13/2023   Procedure: CARDIOVERSION (CATH LAB);  Surgeon: Delford Maude BROCKS, MD;  Location: [facility] INVASIVE CV LAB;  Service: Cardiovascular;  Laterality: N/A;   CATARACT EXTRACTION, BILATERAL     COLONOSCOPY WITH PROPOFOL  N/A 07/12/2021   Procedure: COLONOSCOPY WITH PROPOFOL ;  Surgeon: Dianna Specking, MD;  Location: [facility];  Service: Endoscopy;  Laterality: N/A;   ESOPHAGOGASTRODUODENOSCOPY (EGD) WITH PROPOFOL  N/A 07/12/2021   Procedure: ESOPHAGOGASTRODUODENOSCOPY (EGD) WITH PROPOFOL ;  Surgeon: Dianna Specking, MD;  Location: [facility];  Service: Endoscopy;  Laterality: N/A;   KNEE ARTHROPLASTY Right 06/11/2023   Procedure: COMPUTER ASSISTED TOTAL KNEE ARTHROPLASTY;  Surgeon: Mardee Lynwood SQUIBB, MD;  Location: [facility];  Service: Orthopedics;  Laterality: Right;   POLYPECTOMY  07/12/2021   Procedure: POLYPECTOMY;  Surgeon: Dianna Specking, MD;  Location: [facility];  Service: Endoscopy;;   REPLACEMENT TOTAL KNEE Left 2015   SCLEROTHERAPY  07/12/2021   Procedure: SCLEROTHERAPY;  Surgeon: Dianna Specking, MD;  Location: [facility];  Service: Endoscopy;;   SUBMUCOSAL TATTOO INJECTION  07/12/2021   Procedure: SUBMUCOSAL TATTOO INJECTION;  Surgeon: Dianna Specking, MD;  Location: [facility];  Service: Endoscopy;;   TUBAL LIGATION      SOCIAL HISTORY Social History   Tobacco Use   Smoking status:  Never   Smokeless tobacco: Never  Vaping Use   Vaping status: Never Used  Substance Use Topics   Alcohol use: Never   Drug use: Never    Social History   Social History Narrative   Not on file    SOCIAL DRIVERS OF HEALTH SDOH Screenings   Food Insecurity: No Food Insecurity (06/17/2024)   Received from [facility]  Housing: Low Risk  (06/17/2024)   Received from [facility]  Transportation Needs: No Transportation Needs (06/17/2024)   Received from [facility]  Utilities: Not At Risk (06/17/2024)   Received from [facility]  Depression [phone]): Low Risk  (08/13/2023)  Financial Resource Strain: Low Risk  (06/17/2024)   Received from [facility]  Social Connections: Unknown (01/10/2022)   Received from [facility]  Stress: No Stress Concern Present (05/21/2020)   Received from [facility]  Tobacco Use: Low Risk  (06/21/2024)   Received from [facility]     FAMILY HISTORY Family History  Problem Relation Age of Onset   Breast cancer Maternal Aunt    Atrial fibrillation Mother    Hyperlipidemia Mother  Lung cancer Mother    Atrial fibrillation Father    Colon cancer Father    Breast cancer Sister    Leukemia Brother      ALLERGIES: is allergic to celecoxib, fosamax [alendronate sodium], lipitor [atorvastatin calcium ], valsartan-hydrochlorothiazide , benazepril, nickel, palladium chloride, simvastatin, and valsartan.  MEDICATIONS  Current Outpatient Medications  Medication Sig Dispense Refill   amiodarone  (PACERONE ) 200 MG tablet Take half tablet by mouth Monday through Friday for one month and then decrease to half tablet by mouth every other day     amLODipine  (NORVASC ) 10 MG tablet Take 1 tablet (10 mg total) by mouth daily. 90 tablet 3   B Complex Vitamins (B-COMPLEX/B-12) LIQD Place 1 mL under the tongue in the morning.     calcium  carbonate  (TUMS - DOSED IN MG ELEMENTAL CALCIUM ) 500 MG chewable tablet Chew 2 tablets by mouth daily.     carboxymethylcellulose (REFRESH TEARS) 0.5 % SOLN Place 1-2 drops into both eyes 3 (three) times daily as needed (dry/irritated eyes.).     Cholecalciferol (VITAMIN D3) 125 MCG (5000 UT) CAPS Take 5,000 Units by mouth in the morning.     ELIQUIS  5 MG TABS tablet TAKE 1 TABLET BY MOUTH TWICE A DAY 60 tablet 0   famotidine  (PEPCID ) 20 MG tablet Take 20 mg by mouth 2 (two) times daily.     losartan  (COZAAR ) 50 MG tablet Take 50 mg by mouth at bedtime.     rosuvastatin  (CRESTOR ) 20 MG tablet TAKE 1 TABLET BY MOUTH EVERY DAY 90 tablet 2   No current facility-administered medications for this visit.    PHYSICAL EXAMINATION: ECOG PERFORMANCE STATUS: 1 - Symptomatic but completely ambulatory VITALS: Vitals:   07/16/24 0957  BP: (!) 136/54  Pulse: 75  Resp: 20  Temp: 97.7 F (36.5 C)  SpO2: 97%   Filed Weights   07/16/24 0957  Weight: 197 lb 4.8 oz (89.5 kg)   Body mass index is 34.95 kg/m.  GENERAL: alert, in no acute distress and comfortable SKIN: no acute rashes, no significant lesions EYES: conjunctiva are pink and non-injected, sclera anicteric OROPHARYNX: MMM, no exudates, no oropharyngeal erythema or ulceration NECK: supple, no JVD LYMPH:  no palpable lymphadenopathy in the cervical, axillary or inguinal regions LUNGS: clear to auscultation b/l with normal respiratory effort HEART: regular rate & rhythm ABDOMEN:  normoactive bowel sounds , non tender, not distended, no hepatosplenomegaly Extremity: no pedal edema PSYCH: alert & oriented x 3 with fluent speech NEURO: no focal motor/sensory deficits  LABORATORY DATA:   I have reviewed the data as listed     Latest Ref Rng & Units 07/16/2024    9:28 AM 04/22/2024    9:24 AM 07/17/2023    9:50 AM  CBC EXTENDED  WBC 4.0 - 10.5 K/uL 5.7  5.0  3.2   RBC 3.87 - 5.11 MIL/uL 3.71  3.90  3.49   Hemoglobin 12.0 - 15.0 g/dL 89.1   88.5  89.7   HCT 36.0 - 46.0 % 33.7  34.9  32.1   Platelets 150 - 400 K/uL 176  195  186   NEUT# 1.7 - 7.7 K/uL 4.2   2.0   Lymph# 0.7 - 4.0 K/uL 0.9   0.8     Iron  Studies:  Lab Results  Component Value Date   IRON  48 07/16/2024   UIBC 243 07/16/2024   TIBC 291 07/16/2024   IRONPCTSAT 16 07/16/2024   FERRITIN 167 07/17/2023   Vitamin B12: Lab Results  Component Value Date   VITAMINB12 1,478 (H) 07/17/2023        Latest Ref Rng & Units 07/16/2024    9:28 AM 04/22/2024    9:24 AM 07/17/2023    9:50 AM  CMP  Glucose 70 - 99 mg/dL 892  896  888   BUN 8 - 23 mg/dL 35  30  23   Creatinine 0.44 - 1.00 mg/dL 8.61  8.55  8.57   Sodium 135 - 145 mmol/L 142  140  139   Potassium 3.5 - 5.1 mmol/L 4.6  4.4  4.3   Chloride 98 - 111 mmol/L 106  105  107   CO2 22 - 32 mmol/L 25  21  27    Calcium  8.9 - 10.3 mg/dL 9.7  9.6  9.8   Total Protein 6.5 - 8.1 g/dL 7.4   7.4   Total Bilirubin 0.0 - 1.2 mg/dL 1.0   1.0   Alkaline Phos 38 - 126 U/L 84   68   AST 15 - 41 U/L 36   16   ALT 0 - 44 U/L 27   8      Final Pathologic Diagnosis       A. STOMACH, POLYPS, POLYPECTOMY:  Hyperplastic polyps. Negative for H. pylori by immunohistochemical stain. Negative for intestinal metaplasia, dysplasia, or malignancy.    Electronically signed by Margrette JONETTA Lunger, MD on 08/25/2021 at  1:15 PM      RADIOGRAPHIC STUDIES: I have personally reviewed the radiological images as listed and agreed with the findings in the report. Tissue Exam from 05/16/2022: Final Pathologic Diagnosis    POLYP, STOMACH, POLYPECTOMY:              Hyperplastic polyp with mucosal erosion and focal active inflammation.               No Helicobacter-like organisms are identified on H&E stained slides.              Negative for intestinal metaplasia, dyplasia and malignancy.    RADIOGRAPHIC STUDIES: I have personally reviewed the radiological images as listed and agreed with the findings in the report. No results  found.   ASSESSMENT & PLAN:  78 y.o. female with  #1 Normocytic/microcytic Anemia -due to severe iron  deficiency.   #2 Severe iron  deficiency. Likely due to GI bleeding from gastric polyps in the context of being on anticoagulation . Patient also has been on chronic PPI therapy for more than 20 years which would reduce oral iron  absorption from food.   #3  History of gastric polyps with a bleeding Patient has had resection of her remaining gastric polyps at [facility] on 08/19/2021.  Pathology is negative for Helicobacter pylori and showed hyperplastic polyps.   #4 Atrial fibrillation on Eliquis   PLAN: - Discussed lab results on 07/16/2024 in detail with patient: CBC showed WBC of 5.7K, Hemoglobin of 10.8 decreased from 11.4, and PLTs of 176K.  Mild anemia. CMP with Creatinine 1.38 decreased from 1.44.   Stable CKD Iron  Panel and Ferritin: Iron % 16 and Ferritin 425 Vitamin B12 1005 No indication for additional IV iron  at this time. Continye B12 and B cmoplex  FOLLOW-UP RTC with Dr Onesimo with labs in 12 months  The total time spent in the appointment was 20 minutes* .  All of the patient's questions were answered and the patient knows to call the clinic with any problems, questions, or concerns.  Emaline Onesimo MD MS AAHIVMS [facility]  Hematology/Oncology Physician [facility]  *Total Encounter Time as defined by the Centers for Medicare and Medicaid Services includes, in addition to the face-to-face time of a patient visit (documented in the note above) non-face-to-face time: obtaining and reviewing outside history, ordering and reviewing medications, tests or procedures, care coordination (communications with other health care professionals or caregivers) and documentation in the medical record.  I,Emily Lagle,acting as a neurosurgeon for Emaline Saran, MD.,have documented all relevant documentation on the behalf of Emaline Saran, MD,as directed by  Emaline Saran, MD while in the presence of Emaline Saran, MD.  I have reviewed the above documentation for accuracy and completeness, and I agree with the above.  Nashla Althoff, MD

## 2024-07-21 ENCOUNTER — Other Ambulatory Visit: Payer: Self-pay | Admitting: Internal Medicine

## 2024-07-21 DIAGNOSIS — I480 Paroxysmal atrial fibrillation: Secondary | ICD-10-CM

## 2024-07-21 NOTE — Telephone Encounter (Signed)
 Eliquis  5mg  refill request received. Patient is 78 years old, weight-89.5g, Crea-1.38 on 07/16/24, Diagnosis-Afib, and last seen by Dr. Ladona on 04/22/24. Dose is appropriate based on dosing criteria. Will send in refill to requested pharmacy.

## 2024-07-23 ENCOUNTER — Encounter: Payer: Self-pay | Admitting: Hematology

## 2024-08-09 ENCOUNTER — Other Ambulatory Visit: Payer: Self-pay | Admitting: Physician Assistant

## 2025-07-13 ENCOUNTER — Inpatient Hospital Stay

## 2025-07-13 ENCOUNTER — Inpatient Hospital Stay: Admitting: Hematology
